# Patient Record
Sex: Female | Born: 1990 | Race: Black or African American | Hispanic: No | Marital: Single | Smoking: Former smoker
Health system: Southern US, Community
[De-identification: ages and names within clinical notes are randomized; demographics above are authoritative.]

## PROBLEM LIST (undated history)

## (undated) ENCOUNTER — Inpatient Hospital Stay (HOSPITAL_COMMUNITY): Payer: Self-pay

## (undated) DIAGNOSIS — D649 Anemia, unspecified: Secondary | ICD-10-CM

## (undated) DIAGNOSIS — J45909 Unspecified asthma, uncomplicated: Secondary | ICD-10-CM

## (undated) HISTORY — DX: Anemia, unspecified: D64.9

## (undated) HISTORY — PX: TOOTH EXTRACTION: SUR596

---
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
## 2012-05-27 ENCOUNTER — Emergency Department (HOSPITAL_COMMUNITY): Payer: Medicaid Other

## 2012-05-27 ENCOUNTER — Encounter (HOSPITAL_COMMUNITY): Payer: Self-pay | Admitting: Emergency Medicine

## 2012-05-27 ENCOUNTER — Emergency Department (HOSPITAL_COMMUNITY)
Admission: EM | Admit: 2012-05-27 | Discharge: 2012-05-27 | Disposition: A | Discharge status: Home / Self Care | Payer: Medicaid Other | Attending: Emergency Medicine | Admitting: Emergency Medicine

## 2012-05-27 DIAGNOSIS — R109 Unspecified abdominal pain: Secondary | ICD-10-CM | POA: Insufficient documentation

## 2012-05-27 DIAGNOSIS — O2390 Unspecified genitourinary tract infection in pregnancy, unspecified trimester: Secondary | ICD-10-CM | POA: Insufficient documentation

## 2012-05-27 DIAGNOSIS — E119 Type 2 diabetes mellitus without complications: Secondary | ICD-10-CM | POA: Insufficient documentation

## 2012-05-27 DIAGNOSIS — Z79899 Other long term (current) drug therapy: Secondary | ICD-10-CM | POA: Insufficient documentation

## 2012-05-27 DIAGNOSIS — N72 Inflammatory disease of cervix uteri: Secondary | ICD-10-CM

## 2012-05-27 DIAGNOSIS — F17200 Nicotine dependence, unspecified, uncomplicated: Secondary | ICD-10-CM | POA: Insufficient documentation

## 2012-05-27 DIAGNOSIS — O99891 Other specified diseases and conditions complicating pregnancy: Secondary | ICD-10-CM | POA: Insufficient documentation

## 2012-05-27 DIAGNOSIS — O24919 Unspecified diabetes mellitus in pregnancy, unspecified trimester: Secondary | ICD-10-CM | POA: Insufficient documentation

## 2012-05-27 HISTORY — DX: Unspecified asthma, uncomplicated: J45.909

## 2012-05-27 LAB — COMPREHENSIVE METABOLIC PANEL
ALT: 8 U/L (ref 0–35)
Alkaline Phosphatase: 66 U/L (ref 39–117)
BUN: 5 mg/dL — ABNORMAL LOW (ref 6–23)
Chloride: 98 mEq/L (ref 96–112)
GFR calc Af Amer: >90 mL/min (ref >90)
Glucose, Bld: 215 mg/dL — ABNORMAL HIGH (ref 70–99)
Potassium: 3.7 mEq/L (ref 3.5–5.1)
Total Bilirubin: 0.2 mg/dL — ABNORMAL LOW (ref 0.3–1.2)

## 2012-05-27 LAB — LIPASE, BLOOD: Lipase: 31 U/L (ref 11–59)

## 2012-05-27 LAB — DIFFERENTIAL
Lymphocytes Relative: 35 % (ref 12–46)
Lymphs Abs: 3 10*3/uL (ref 0.7–4.0)
Monocytes Relative: 8 % (ref 3–12)
Neutro Abs: 4.8 10*3/uL (ref 1.7–7.7)
Neutrophils Relative %: 57 % (ref 43–77)

## 2012-05-27 LAB — URINALYSIS, ROUTINE W REFLEX MICROSCOPIC
Bilirubin Urine: NEGATIVE
Leukocytes, UA: NEGATIVE
Nitrite: NEGATIVE
Specific Gravity, Urine: 1.039 — ABNORMAL HIGH (ref 1.005–1.030)
pH: 6 (ref 5.0–8.0)

## 2012-05-27 LAB — CBC
Hemoglobin: 13.5 g/dL (ref 12.0–15.0)
RBC: 4.84 MIL/uL (ref 3.87–5.11)
WBC: 8.5 10*3/uL (ref 4.0–10.5)

## 2012-05-27 LAB — URINE MICROSCOPIC-ADD ON

## 2012-05-27 LAB — WET PREP, GENITAL

## 2012-05-27 MED ORDER — ONDANSETRON HCL 4 MG/2ML IJ SOLN
4.0000 mg | Freq: Once | INTRAMUSCULAR | Status: DC
  Filled 2012-05-27 (×2): qty 2

## 2012-05-27 MED ORDER — METRONIDAZOLE 500 MG PO TABS: 500.0000 mg | ORAL_TABLET | Freq: Two times a day (BID) | ORAL | Status: AC

## 2012-05-27 MED ORDER — ONDANSETRON 4 MG PO TBDP
ORAL_TABLET | ORAL | Status: AC
  Administered 2012-05-27: 4 mg
  Filled 2012-05-27: qty 2

## 2012-05-27 MED ORDER — ONDANSETRON HCL 4 MG PO TABS: 4.0000 mg | ORAL_TABLET | Freq: Four times a day (QID) | ORAL | Status: AC

## 2012-05-27 MED ORDER — CEFTRIAXONE SODIUM 250 MG IJ SOLR
250.0000 mg | Freq: Once | INTRAMUSCULAR | Status: AC
  Administered 2012-05-27: 250 mg via INTRAMUSCULAR
  Filled 2012-05-27: qty 250

## 2012-05-27 MED ORDER — FAMOTIDINE 20 MG PO TABS: 20.0000 mg | ORAL_TABLET | Freq: Two times a day (BID) | ORAL | Status: DC

## 2012-05-27 MED ORDER — AZITHROMYCIN 250 MG PO TABS
1000.0000 mg | ORAL_TABLET | Freq: Once | ORAL | Status: AC
  Administered 2012-05-27: 1000 mg via ORAL
  Filled 2012-05-27: qty 4

## 2012-05-27 MED ORDER — LIDOCAINE HCL (PF) 1 % IJ SOLN
INTRAMUSCULAR | Status: AC
  Administered 2012-05-27: 2 mL
  Filled 2012-05-27: qty 5

## 2012-05-27 NOTE — ED Notes (Signed)
Patient tolerated pelvic exam w/o difficulty

## 2012-05-27 NOTE — ED Notes (Signed)
Patient reports she is having lower abd cramping for 1 week that wraps around to her right side.  She has had urinary frequency x 6 weeks.  Patient reports she has normal vaginal discharge,  No odor.  No bleeding.  She is to see the obgyn  Next month,  She will [facility] the [facility]

## 2012-05-27 NOTE — ED Notes (Signed)
Found out on 6/17 that she was 3 weeks preg and now she has been cramping x 1 week no bleeding  G 2 P 1 A 0 L ! lmp 04/14/12

## 2012-05-27 NOTE — Discharge Instructions (Signed)
Cervicitis   Cervicitis is a soreness and swelling (inflammation) of the cervix. Your cervix is located at the bottom of your uterus which opens up to the vagina.   CAUSES   Sexually transmitted infections (STIs).   Allergic reaction.   Medicines or birth control devices that are put in the vagina.   Injury to the cervix.   Bacterial infections.   SYMPTOMS   There may be no symptoms. If symptoms occur, they may include:   Grey, white, yellow, or bad smelling vaginal discharge.   Pain or itching of the area outside the vagina.   Painful sexual intercourse.   Lower abdominal or lower back pain, especially during intercourse.   Frequent urination.   Abnormal vaginal bleeding between periods, after sexual intercourse, or after menopause.   Pressure or a heavy feeling in the pelvis.   DIAGNOSIS   Diagnosis is made after a pelvic exam. Other tests may include:   Examination of any discharge under a microscope (wet prep).   A Pap test.   TREATMENT   Treatment will depend on the cause of cervicitis. If it is caused by an STI, both you and your partner will need to be treated. Antibiotic medicines will be given.   HOME CARE INSTRUCTIONS   Do not have sexual intercourse until your caregiver says it is okay.   Do not have sexual intercourse until your partner has been treated if your cervicitis is caused by an STI.   Take your antibiotics as directed. Finish them even if you start to feel better.   SEEK IMMEDIATE MEDICAL CARE IF:   Your symptoms come back.   You have a fever.   You experience any problems that may be related to the medicine you are taking.   MAKE SURE YOU:   Understand these instructions.   Will watch your condition.   Will get help right away if you are not doing well or get worse.   Document Released: 11/23/2005 Document Revised: 11/12/2011 Document Reviewed: 06/22/2011   [employer] Patient Information 2012 [employer].

## 2012-05-27 NOTE — ED Provider Notes (Signed)
History     CSN: [id number]  Arrival date & time 05/27/12  1119   First MD Initiated Contact with Patient 05/27/12 1156      Chief Complaint  Patient presents with  . Abdominal Pain    (Consider location/radiation/quality/duration/timing/severity/associated sxs/prior treatment) HPI Pt has had 1 week of abd cramps with associated nausea. States she is [redacted] weeks pregnant with LMP 04/14/12. No vaginal bleeding or d/c. No fever or chills. Pain not associated with eating.  Past Medical History  Diagnosis Date  . Diabetes mellitus   . Asthma     History reviewed. No pertinent past surgical history.  No family history on file.  History  Substance Use Topics  . Smoking status: Current Some Day Smoker  . Smokeless tobacco: Not on file  . Alcohol Use: No    OB History    Grav Para Term Preterm Abortions TAB SAB Ect Mult Living                  Review of Systems  Constitutional: Negative for fever and chills.  Respiratory: Negative for shortness of breath.   Cardiovascular: Negative for chest pain and palpitations.  Gastrointestinal: Positive for nausea and abdominal pain. Negative for vomiting, diarrhea and constipation.  Genitourinary: Negative for dysuria, vaginal bleeding, vaginal discharge, menstrual problem and pelvic pain.  Musculoskeletal: Positive for back pain.  Skin: Negative for rash and wound.    Allergies  Review of patient's allergies indicates no known allergies.  Home Medications   Current Outpatient Rx  Name Route Sig Dispense Refill  . FAMOTIDINE 20 MG PO TABS Oral Take 1 tablet (20 mg total) by mouth 2 (two) times daily. 30 tablet 0  . METRONIDAZOLE 500 MG PO TABS Oral Take 1 tablet (500 mg total) by mouth 2 (two) times daily. 14 tablet 0  . ONDANSETRON HCL 4 MG PO TABS Oral Take 1 tablet (4 mg total) by mouth every 6 (six) hours. 12 tablet 0    BP 134/70  Pulse 84  Temp 98.4 F (36.9 C) (Oral)  Resp 20  SpO2 98%  Physical Exam  Nursing note  and vitals reviewed. Constitutional: She is oriented to person, place, and time. She appears well-developed and well-nourished. No distress.  HENT:  Head: Normocephalic and atraumatic.  Mouth/Throat: Oropharynx is clear and moist.  Eyes: EOM are normal. Pupils are equal, round, and reactive to light.  Neck: Normal range of motion. Neck supple.  Cardiovascular: Normal rate and regular rhythm.   Pulmonary/Chest: Effort normal and breath sounds normal. No respiratory distress. She has no wheezes. She has no rales.  Abdominal: Soft. Bowel sounds are normal. She exhibits no distension and no mass. There is tenderness (mild epigastric and suprapubic TTP. No rebound or guarding). There is no rebound and no guarding.  Genitourinary: Vaginal discharge (white vaginal d/c. No CMT. Mild uterine fundal TTP. No adnexal TTP) found.  Musculoskeletal: Normal range of motion. She exhibits no edema and no tenderness.  Neurological: She is alert and oriented to person, place, and time.  Skin: Skin is warm and dry. No rash noted. No erythema.  Psychiatric: She has a normal mood and affect. Her behavior is normal.    ED Course  Procedures (including critical care time)  Labs Reviewed  URINALYSIS, ROUTINE W REFLEX MICROSCOPIC - Abnormal; Notable for the following:    APPearance HAZY (*)     Specific Gravity, Urine 1.039 (*)     Glucose, UA >1000 (*)  All other components within normal limits  COMPREHENSIVE METABOLIC PANEL - Abnormal; Notable for the following:    Sodium 131 (*)     Glucose, Bld 215 (*)     BUN 5 (*)     Total Bilirubin 0.2 (*)     All other components within normal limits  HCG, QUANTITATIVE, PREGNANCY - Abnormal; Notable for the following:    hCG, Beta Chain, Quant, S 2077 (*)     All other components within normal limits  WET PREP, GENITAL - Abnormal; Notable for the following:    Clue Cells Wet Prep HPF POC MODERATE (*)     WBC, Wet Prep HPF POC MANY (*)     All other components  within normal limits  URINE MICROSCOPIC-ADD ON - Abnormal; Notable for the following:    Squamous Epithelial / LPF FEW (*)     All other components within normal limits  CBC  DIFFERENTIAL  LIPASE, BLOOD  GC/CHLAMYDIA PROBE AMP, GENITAL   US Ob Comp Less 14 Wks  05/27/2012  *RADIOLOGY REPORT*  Clinical Data: Pregnancy with abdominal pain  OBSTETRIC <14 WK [place] AND TRANSVAGINAL OB US  Technique:  Both transabdominal and transvaginal ultrasound examinations were performed for complete evaluation of the gestation as well as the maternal uterus, adnexal regions, and pelvic cul-de-sac.  Transvaginal technique was performed to assess early pregnancy.  Comparison:  None.  Intrauterine gestational sac:  Visualized/normal in shape. Yolk sac: Seen Embryo: Not seen Cardiac Activity: Not seen Heart Rate:  bpm  MSD: 5.7   mm  5    w 2    d CRL:    mm     w     d        [place] EDC:  Maternal uterus/adnexae: The right ovary appears normal measuring 3.6 x 3.0 by 2.4 cm and the left ovary appears normal measuring 3.1 x 2.9 x 1.2 cm  A trace of simple free fluid is noted in the cul-de-sac.  No separate adnexal masses are identified  IMPRESSION: Single intrauterine gestational sac with yolk sac.  No fetal pole is yet seen but not typically expected at today's mean sac diameter of 5.7 mm.  Given normal growth rates we would typically expect to see development of a fetal pole within 1 week.  Correlation with serial beta HCG with follow-up ultrasound after this.  Can be undertaken to assess for appropriate progression of gestation.  Normal ovaries.  No adnexal masses or complex pelvic fluid noted  Original Report Authenticated By: Bertha Stakes, M.D.   US Ob Transvaginal  05/27/2012  *RADIOLOGY REPORT*  Clinical Data: Pregnancy with abdominal pain  OBSTETRIC <14 WK [place] AND TRANSVAGINAL OB US  Technique:  Both transabdominal and transvaginal ultrasound examinations were performed for complete evaluation of the gestation as well  as the maternal uterus, adnexal regions, and pelvic cul-de-sac.  Transvaginal technique was performed to assess early pregnancy.  Comparison:  None.  Intrauterine gestational sac:  Visualized/normal in shape. Yolk sac: Seen Embryo: Not seen Cardiac Activity: Not seen Heart Rate:  bpm  MSD: 5.7   mm  5    w 2    d CRL:    mm     w     d        [place] EDC:  Maternal uterus/adnexae: The right ovary appears normal measuring 3.6 x 3.0 by 2.4 cm and the left ovary appears normal measuring 3.1 x 2.9 x 1.2 cm  A  trace of simple free fluid is noted in the cul-de-sac.  No separate adnexal masses are identified  IMPRESSION: Single intrauterine gestational sac with yolk sac.  No fetal pole is yet seen but not typically expected at today's mean sac diameter of 5.7 mm.  Given normal growth rates we would typically expect to see development of a fetal pole within 1 week.  Correlation with serial beta HCG with follow-up ultrasound after this.  Can be undertaken to assess for appropriate progression of gestation.  Normal ovaries.  No adnexal masses or complex pelvic fluid noted  Original Report Authenticated By: Bertha Stakes, M.D.     1. Abdominal pain   2. Cervicitis       MDM   Pt comfortable appearing. F/u with OB-GYN or Health department. Return for concerns       Loren Racer, MD 05/27/12 1541

## 2012-05-28 LAB — GC/CHLAMYDIA PROBE AMP, GENITAL
Chlamydia, DNA Probe: NEGATIVE
GC Probe Amp, Genital: NEGATIVE

## 2012-06-29 ENCOUNTER — Other Ambulatory Visit (HOSPITAL_COMMUNITY): Payer: Self-pay | Admitting: Family

## 2012-06-29 DIAGNOSIS — Z3682 Encounter for antenatal screening for nuchal translucency: Secondary | ICD-10-CM

## 2012-06-29 LAB — OB RESULTS CONSOLE ABO/RH

## 2012-06-29 LAB — OB RESULTS CONSOLE HEPATITIS B SURFACE ANTIGEN: Hepatitis B Surface Ag: NEGATIVE

## 2012-06-29 LAB — OB RESULTS CONSOLE RPR: RPR: NONREACTIVE

## 2012-06-29 LAB — OB RESULTS CONSOLE ANTIBODY SCREEN: Antibody Screen: NEGATIVE

## 2012-06-29 LAB — OB RESULTS CONSOLE HIV ANTIBODY (ROUTINE TESTING): HIV: NONREACTIVE

## 2012-06-30 ENCOUNTER — Encounter (INDEPENDENT_AMBULATORY_CARE_PROVIDER_SITE_OTHER): Payer: Medicaid Other | Admitting: Surgery

## 2012-07-04 ENCOUNTER — Encounter: Payer: Medicaid Other | Attending: Family Medicine | Admitting: Dietician

## 2012-07-04 ENCOUNTER — Ambulatory Visit: Payer: Medicaid Other | Admitting: *Deleted

## 2012-07-04 DIAGNOSIS — O99810 Abnormal glucose complicating pregnancy: Secondary | ICD-10-CM

## 2012-07-04 DIAGNOSIS — Z713 Dietary counseling and surveillance: Secondary | ICD-10-CM | POA: Insufficient documentation

## 2012-07-04 MED ORDER — GLUCOSE BLOOD VI STRP: ORAL_STRIP | Status: DC

## 2012-07-04 MED ORDER — ACCU-CHEK FASTCLIX LANCETS MISC: 1.0000 | Freq: Four times a day (QID) | Status: DC

## 2012-07-04 NOTE — Progress Notes (Signed)
Diabetes Education:  Seen today for meter and meter instruction.  Gives history of GDM with last pregnancy 5 years ago.  Provided an [employer], [id number] Exp: 09/05/2013.  Oriented to testing fasting and 2 hr post meal blood glucose levels.  On return demonstration, Her blood glucose was 165 mg/dl.  Had no breakfast and had eaten only a pack of graham crackers and drank some water approximately 75 minutes previously.  Instructed to bring meter and glucose log to every clinic appointment.  Maggie Chareese Sergent, RN, RD, CDE

## 2012-07-04 NOTE — Progress Notes (Signed)
Nutrition Note:  1st visit- RD/CDE only Diagnosis:  Pt seen for DM education during pregnancy Wt today 164.9.  Loss of .1# from clients reported pregravid weight of 165. Pt. Given verbal and written DM education Reports nausea, heartburn, and constipation Smokes 2 cig/d - decrease from 1 ppd prior to pregnancy PNV daily Diet is inconsistent - appears to be good variety/eats when ever she is hungry/no set meal pattern Client Action Steps:  Agrees to follow DM diet including 3 meals and 3 snacks and proper CHO/protein combination. Discussed weight gain goals of 11-20# and incorporating PA [facility] appointment 07/12/12 F/U in 2-4 weeks Wilford Sports, MPH, RD, LDN

## 2012-07-11 ENCOUNTER — Encounter: Payer: Self-pay | Admitting: Family Medicine

## 2012-07-11 ENCOUNTER — Ambulatory Visit (INDEPENDENT_AMBULATORY_CARE_PROVIDER_SITE_OTHER): Payer: Medicaid Other | Admitting: Family Medicine

## 2012-07-11 VITALS — BP 138/73 | Temp 97.4°F | Ht 61.0 in | Wt 165.7 lb

## 2012-07-11 DIAGNOSIS — O24919 Unspecified diabetes mellitus in pregnancy, unspecified trimester: Secondary | ICD-10-CM

## 2012-07-11 DIAGNOSIS — O09899 Supervision of other high risk pregnancies, unspecified trimester: Secondary | ICD-10-CM | POA: Insufficient documentation

## 2012-07-11 DIAGNOSIS — J45909 Unspecified asthma, uncomplicated: Secondary | ICD-10-CM | POA: Insufficient documentation

## 2012-07-11 DIAGNOSIS — Z283 Underimmunization status: Secondary | ICD-10-CM

## 2012-07-11 DIAGNOSIS — O9989 Other specified diseases and conditions complicating pregnancy, childbirth and the puerperium: Secondary | ICD-10-CM

## 2012-07-11 DIAGNOSIS — Z2839 Other underimmunization status: Secondary | ICD-10-CM | POA: Insufficient documentation

## 2012-07-11 LAB — COMPREHENSIVE METABOLIC PANEL
ALT: <8 U/L (ref 0–35)
BUN: 6 mg/dL (ref 6–23)
CO2: 18 mEq/L — ABNORMAL LOW (ref 19–32)
Calcium: 9.5 mg/dL (ref 8.4–10.5)
Chloride: 103 mEq/L (ref 96–112)
Creat: 0.44 mg/dL — ABNORMAL LOW (ref 0.50–1.10)
Glucose, Bld: 160 mg/dL — ABNORMAL HIGH (ref 70–99)
Total Bilirubin: 0.3 mg/dL (ref 0.3–1.2)

## 2012-07-11 LAB — POCT URINALYSIS DIP (DEVICE)
Hgb urine dipstick: NEGATIVE
Ketones, ur: NEGATIVE mg/dL
Leukocytes, UA: NEGATIVE
Protein, ur: NEGATIVE mg/dL
pH: 6 (ref 5.0–8.0)

## 2012-07-11 MED ORDER — INSULIN NPH (HUMAN) (ISOPHANE) 100 UNIT/ML ~~LOC~~ SUSP: 9.0000 [IU] | Freq: Two times a day (BID) | SUBCUTANEOUS | Status: DC

## 2012-07-11 MED ORDER — INSULIN ASPART 100 UNIT/ML ~~LOC~~ SOLN: 11.0000 [IU] | Freq: Three times a day (TID) | SUBCUTANEOUS | Status: DC

## 2012-07-11 MED ORDER — ACCU-CHEK FASTCLIX LANCETS MISC: 1.0000 [IU] | Freq: Four times a day (QID) | Status: DC

## 2012-07-11 MED ORDER — GLUCOSE BLOOD VI STRP: ORAL_STRIP | Status: DC

## 2012-07-11 NOTE — Progress Notes (Signed)
   Subjective:    Cindy Bruce is a G2P1001 [redacted]w[redacted]d being seen today for her first obstetrical visit.  Her obstetrical history is significant for smoker and diabetes. Patient does intend to breast feed. Pregnancy history fully reviewed.  She is transferring from [facility].  Was insulin requiring Diabetes in previous pregnancy in [facility], unclear about whether she underwent formal Diabetes testing pp.  Reports her BS are elevated at home on her old meter.  Met with Diabetes education last week.  [id number]-[id number] 2 hour pp 136-262  Patient reports nausea.  Filed Vitals:   07/11/12 0915 07/11/12 0917  BP: 138/73   Temp: 97.4 F (36.3 C)   Height:  5\' 1"  (1.549 m)  Weight: 165 lb 11.2 oz (75.161 kg)     HISTORY: OB History    Grav Para Term Preterm Abortions TAB SAB Ect Mult Living   2 1 1  0 0 0 0 0 0 1     # Outc Date GA Lbr Len/2nd Wgt Sex Del Anes PTL Lv   1 TRM 11/08 [redacted]w[redacted]d  6lb7oz(2.92kg) F SVD EPI No Yes   2 CUR              Past Medical History  Diagnosis Date  . Diabetes mellitus   . Asthma    Past Surgical History  Procedure Date  . Tooth extraction    Family History  Problem Relation Age of Onset  . Diabetes Mother   . Diabetes Maternal Grandmother      Exam                                      System:     Skin: normal coloration and turgor, no rashes    Neurologic: oriented   Extremities: normal strength, tone, and muscle mass   HEENT sclera clear, anicteric   Mouth/Teeth mucous membranes moist, pharynx normal without lesions   Neck supple   Cardiovascular: regular rate and rhythm   Respiratory:  appears well, vitals normal, no respiratory distress, acyanotic, normal RR, ear and throat exam is normal, neck free of mass or lymphadenopathy, chest clear, no wheezing, crepitations, rhonchi, normal symmetric air entry   Abdomen: soft, non-tender; bowel sounds normal; no masses,  no organomegaly          Assessment:    Pregnancy: G2P1001 Patient Active  Problem List  Diagnosis  . Diabetes mellitus, antepartum  . Asthma        Plan:     Initial labs drawn. Prenatal vitamins. Problem list reviewed and updated. Genetic Screening discussed First Screen: ordered.  Ultrasound discussed; fetal survey: discussed.  Follow up in 1 weeks. Baseline labs on return with 24 hour urine. Insulin start today--weight based NPH 9 units q am and q hs Novolog 11 units ac meals.  Laelle Bridgett S 07/11/2012

## 2012-07-11 NOTE — Progress Notes (Signed)
Opthamology appointment scheduled with [facility] on August 22, 2012 at 1030 am.

## 2012-07-11 NOTE — Patient Instructions (Signed)
Gestational Diabetes Mellitus Gestational diabetes mellitus (GDM) is diabetes that occurs only during pregnancy. This happens when the body cannot properly handle the glucose (sugar) that increases in the blood after eating. During pregnancy, insulin resistance (reduced sensitivity to insulin) occurs because of the release of hormones from the placenta. Usually, the pancreas of pregnant women produces enough insulin to overcome the resistance that occurs. However, in gestational diabetes, the insulin is there but it does not work effectively. If the resistance is severe enough that the pancreas does not produce enough insulin, extra glucose builds up in the blood.  WHO IS AT RISK FOR DEVELOPING GESTATIONAL DIABETES?  Women with a history of diabetes in the family.   Women over age 25.   Women who are overweight.   Women in certain ethnic groups (Hispanic, African American, Native American, Asian and Pacific Islander).  WHAT CAN HAPPEN TO THE BABY? If the mother's blood glucose is too high while she is pregnant, the extra sugar will travel through the umbilical cord to the baby. Some of the problems the baby may have are:  Large Baby - If the baby receives too much sugar, the baby will gain more weight. This may cause the baby to be too large to be born normally (vaginally) and a Cesarean section (C-section) may be needed.   Low Blood Glucose (hypoglycemia) - The baby makes extra insulin, in response to the extra sugar its gets from its mother. When the baby is born and no longer needs this extra insulin, the baby's blood glucose level may drop.   Jaundice (yellow coloring of the skin and eyes) - This is fairly common in babies. It is caused from a build-up of the chemical called bilirubin. This is rarely serious, but is seen more often in babies whose mothers had gestational diabetes.  RISKS TO THE MOTHER Women who have had gestational diabetes may be at higher risk for some problems,  including:  Preeclampsia or toxemia, which includes problems with high blood pressure. Blood pressure and protein levels in the urine must be checked frequently.   Infections.   Cesarean section (C-section) for delivery.   Developing Type 2 diabetes later in life. About 30-50% will develop diabetes later, especially if obese.  DIAGNOSIS  The hormones that cause insulin resistance are highest at about 24-28 weeks of pregnancy. If symptoms are experienced, they are much like symptoms you would normally expect during pregnancy.  GDM is often diagnosed using a two part method: 1. After 24-28 weeks of pregnancy, the woman drinks a glucose solution and takes a blood test. If the glucose level is high, a second test will be given.  2. Oral Glucose Tolerance Test (OGTT) which is 3 hours long - After not eating overnight, the blood glucose is checked. The woman drinks a glucose solution, and hourly blood glucose tests are taken.  If the woman has risk factors for GDM, the caregiver may test earlier than 24 weeks of pregnancy. TREATMENT  Treatment of GDM is directed at keeping the mother's blood glucose level normal, and may include:  Meal planning.   Taking insulin or other medicine to control your blood glucose level.   Exercise.   Keeping a daily record of the foods you eat.   Blood glucose monitoring and keeping a record of your blood glucose levels.   May monitor ketone levels in the urine, although this is no longer considered necessary in most pregnancies.  HOME CARE INSTRUCTIONS  While you are pregnant:    Follow your caregiver's advice regarding your prenatal appointments, meal planning, exercise, medicines, vitamins, blood and other tests, and physical activities.   Keep a record of your meals, blood glucose tests, and the amount of insulin you are taking (if any). Show this to your caregiver at every prenatal visit.   If you have GDM, you may have problems with hypoglycemia (low  blood glucose). You may suspect this if you become suddenly dizzy, feel shaky, and/or weak. If you think this is happening and you have a glucose meter, try to test your blood glucose level. Follow your caregiver's advice for when and how to treat your low blood glucose. Generally, the 15:15 rule is followed: Treat by consuming 15 grams of carbohydrates, wait 15 minutes, and recheck blood glucose. Examples of 15 grams of carbohydrates are:   1 cup skim or low-fat milk.    cup juice.   3-4 glucose tablets.   5-6 hard candies.   1 small box raisins.    cup regular soda pop.   Practice good hygiene, to avoid infections.   Do not smoke.  SEEK MEDICAL CARE IF:   You develop abnormal vaginal discharge, with or without itching.   You become weak and tired more than expected.   You seem to sweat a lot.   You have a sudden increase in weight, 5 pounds or more in one week.   You are losing weight, 3 pounds or more in a week.   Your blood glucose level is high, and you need instructions on what to do about it.  SEEK IMMEDIATE MEDICAL CARE IF:   You develop a severe headache.   You faint or pass out.   You develop nausea and vomiting.   You become disoriented or confused.   You have a convulsion.   You develop vision problems.   You develop stomach pain.   You develop vaginal bleeding.   You develop uterine contractions.   You have leaking or a gush of fluid from the vagina.  AFTER YOU HAVE THE BABY:  Go to all of your follow-up appointments, and have blood tests as advised by your caregiver.   Maintain a healthy lifestyle, to prevent diabetes in the future. This includes:   Following a healthy meal plan.   Controlling your weight.   Getting enough exercise and proper rest.   Do not smoke.   Breastfeed your baby if you can. This will lower the chance of you and your baby developing diabetes later in life.  For more information about diabetes, go to the American  Diabetes Association at: www.americandiabetesassociation.org. For more information about gestational diabetes, go to the American Congress of Obstetricians and Gynecologists at: www.acog.org. Document Released: 03/01/2001 Document Revised: 11/12/2011 Document Reviewed: 09/23/2009 [employer] Patient Information 2012 [employer]. Breastfeeding BENEFITS OF BREASTFEEDING For the baby  The first milk (colostrum) helps the baby's digestive system function better.   There are antibodies from the mother in the milk that help the baby fight off infections.   The baby has a lower incidence of asthma, allergies, and SIDS (sudden infant death syndrome).   The nutrients in breast milk are better than formulas for the baby and helps the baby's brain grow better.   Babies who breastfeed have less gas, colic, and constipation.  For the mother  Breastfeeding helps develop a very special bond between mother and baby.   It is more convenient, always available at the correct temperature and cheaper than formula feeding.   It   burns calories in the mother and helps with losing weight that was gained during pregnancy.   It makes the uterus contract back down to normal size faster and slows bleeding following delivery.   Breastfeeding mothers have a lower risk of developing breast cancer.  NURSE FREQUENTLY  A healthy, full-term baby may breastfeed as often as every hour or space his or her feedings to every 3 hours.   How often to nurse will vary from baby to baby. Watch your baby for signs of hunger, not the clock.   Nurse as often as the baby requests, or when you feel the need to reduce the fullness of your breasts.   Awaken the baby if it has been 3 to 4 hours since the last feeding.   Frequent feeding will help the mother make more milk and will prevent problems like sore nipples and engorgement of the breasts.  BABY'S POSITION AT THE BREAST  Whether lying down or sitting, be sure that the  baby's tummy is facing your tummy.   Support the breast with 4 fingers underneath the breast and the thumb above. Make sure your fingers are well away from the nipple and baby's mouth.   Stroke the baby's lips and cheek closest to the breast gently with your finger or nipple.   When the baby's mouth is open wide enough, place all of your nipple and as much of the dark area around the nipple as possible into your baby's mouth.   Pull the baby in close so the tip of the nose and the baby's cheeks touch the breast during the feeding.  FEEDINGS  The length of each feeding varies from baby to baby and from feeding to feeding.   The baby must suck about 2 to 3 minutes for your milk to get to him or her. This is called a "let down." For this reason, allow the baby to feed on each breast as long as he or she wants. Your baby will end the feeding when he or she has received the right balance of nutrients.   To break the suction, put your finger into the corner of the baby's mouth and slide it between his or her gums before removing your breast from his or her mouth. This will help prevent sore nipples.  REDUCING BREAST ENGORGEMENT  In the first week after your baby is born, you may experience signs of breast engorgement. When breasts are engorged, they feel heavy, warm, full, and may be tender to the touch. You can reduce engorgement if you:   Nurse frequently, every 2 to 3 hours. Mothers who breastfeed early and often have fewer problems with engorgement.   Place light ice packs on your breasts between feedings. This reduces swelling. Wrap the ice packs in a lightweight towel to protect your skin.   Apply moist hot packs to your breast for 5 to 10 minutes before each feeding. This increases circulation and helps the milk flow.   Gently massage your breast before and during the feeding.   Make sure that the baby empties at least one breast at every feeding before switching sides.   Use a breast  pump to empty the breasts if your baby is sleepy or not nursing well. You may also want to pump if you are returning to work or or you feel you are getting engorged.   Avoid bottle feeds, pacifiers or supplemental feedings of water or juice in place of breastfeeding.   Be sure the   baby is latched on and positioned properly while breastfeeding.   Prevent fatigue, stress, and anemia.   Wear a supportive bra, avoiding underwire styles.   Eat a balanced diet with enough fluids.  If you follow these suggestions, your engorgement should improve in 24 to 48 hours. If you are still experiencing difficulty, call your lactation consultant or caregiver. IS MY BABY GETTING ENOUGH MILK? Sometimes, mothers worry about whether their babies are getting enough milk. You can be assured that your baby is getting enough milk if:  The baby is actively sucking and you hear swallowing.   The baby nurses at least 8 to 12 times in a 24 hour time period. Nurse your baby until he or she unlatches or falls asleep at the first breast (at least 10 to 20 minutes), then offer the second side.   The baby is wetting 5 to 6 disposable diapers (6 to 8 cloth diapers) in a 24 hour period by 5 to 6 days of age.   The baby is having at least 2 to 3 stools every 24 hours for the first few months. Breast milk is all the food your baby needs. It is not necessary for your baby to have water or formula. In fact, to help your breasts make more milk, it is best not to give your baby supplemental feedings during the early weeks.   The stool should be soft and yellow.   The baby should gain 4 to 7 ounces per week after he is 4 days old.  TAKE CARE OF YOURSELF Take care of your breasts by:  Bathing or showering daily.   Avoiding the use of soaps on your nipples.   Start feedings on your left breast at one feeding and on your right breast at the next feeding.   You will notice an increase in your milk supply 2 to 5 days after  delivery. You may feel some discomfort from engorgement, which makes your breasts very firm and often tender. Engorgement "peaks" out within 24 to 48 hours. In the meantime, apply warm moist towels to your breasts for 5 to 10 minutes before feeding. Gentle massage and expression of some milk before feeding will soften your breasts, making it easier for your baby to latch on. Wear a well fitting nursing bra and air dry your nipples for 10 to 15 minutes after each feeding.   Only use cotton bra pads.   Only use pure lanolin on your nipples after nursing. You do not need to wash it off before nursing.  Take care of yourself by:   Eating well-balanced meals and nutritious snacks.   Drinking milk, fruit juice, and water to satisfy your thirst (about 8 glasses a day).   Getting plenty of rest.   Increasing calcium in your diet (1200 mg a day).   Avoiding foods that you notice affect the baby in a bad way.  SEEK MEDICAL CARE IF:   You have any questions or difficulty with breastfeeding.   You need help.   You have a hard, red, sore area on your breast, accompanied by a fever of 100.5 F (38.1 C) or more.   Your baby is too sleepy to eat well or is having trouble sleeping.   Your baby is wetting less than 6 diapers per day, by 5 days of age.   Your baby's skin or white part of his or her eyes is more yellow than it was in the hospital.   You feel depressed.    Document Released: 11/23/2005 Document Revised: 11/12/2011 Document Reviewed: 07/08/2009 [employer] Patient Information 2012 [employer]. 

## 2012-07-11 NOTE — Progress Notes (Signed)
[id number]=[phone] 

## 2012-07-12 ENCOUNTER — Other Ambulatory Visit: Payer: Self-pay

## 2012-07-12 ENCOUNTER — Encounter (HOSPITAL_COMMUNITY): Payer: Self-pay

## 2012-07-12 ENCOUNTER — Ambulatory Visit (HOSPITAL_COMMUNITY)
Admission: RE | Admit: 2012-07-12 | Discharge: 2012-07-12 | Disposition: A | Discharge status: Home / Self Care | Payer: Medicaid Other | Source: Ambulatory Visit | Attending: Family Medicine | Admitting: Family Medicine

## 2012-07-12 ENCOUNTER — Encounter: Payer: Self-pay | Admitting: Family Medicine

## 2012-07-12 ENCOUNTER — Ambulatory Visit (HOSPITAL_COMMUNITY)
Admission: RE | Admit: 2012-07-12 | Discharge: 2012-07-12 | Disposition: A | Discharge status: Home / Self Care | Payer: Medicaid Other | Source: Ambulatory Visit | Attending: Family | Admitting: Family

## 2012-07-12 DIAGNOSIS — O3510X0 Maternal care for (suspected) chromosomal abnormality in fetus, unspecified, not applicable or unspecified: Secondary | ICD-10-CM | POA: Insufficient documentation

## 2012-07-12 DIAGNOSIS — O351XX0 Maternal care for (suspected) chromosomal abnormality in fetus, not applicable or unspecified: Secondary | ICD-10-CM | POA: Insufficient documentation

## 2012-07-12 DIAGNOSIS — Z283 Underimmunization status: Secondary | ICD-10-CM

## 2012-07-12 DIAGNOSIS — O24919 Unspecified diabetes mellitus in pregnancy, unspecified trimester: Secondary | ICD-10-CM | POA: Insufficient documentation

## 2012-07-12 DIAGNOSIS — Z3682 Encounter for antenatal screening for nuchal translucency: Secondary | ICD-10-CM

## 2012-07-12 DIAGNOSIS — Z3689 Encounter for other specified antenatal screening: Secondary | ICD-10-CM | POA: Insufficient documentation

## 2012-07-12 MED ORDER — GLUCOSE BLOOD VI STRP: ORAL_STRIP | Status: DC

## 2012-07-12 MED ORDER — INSULIN ASPART 100 UNIT/ML ~~LOC~~ SOLN: 11.0000 [IU] | Freq: Three times a day (TID) | SUBCUTANEOUS | Status: DC

## 2012-07-12 MED ORDER — INSULIN NPH (HUMAN) (ISOPHANE) 100 UNIT/ML ~~LOC~~ SUSP: 9.0000 [IU] | Freq: Two times a day (BID) | SUBCUTANEOUS | Status: DC

## 2012-07-12 MED ORDER — FAMOTIDINE 20 MG PO TABS: 20.0000 mg | ORAL_TABLET | Freq: Two times a day (BID) | ORAL | Status: DC

## 2012-07-12 MED ORDER — ACCU-CHEK FASTCLIX LANCETS MISC: 1.0000 | Freq: Four times a day (QID) | Status: DC

## 2012-07-12 NOTE — Progress Notes (Signed)
Cindy Bruce was seen for ultrasound appointment today.  Please see [facility] report for details.  

## 2012-07-12 NOTE — Telephone Encounter (Signed)
MFM called and stated that Rx's was sent to wrong pharmacy could we change pharmacy.  Verified pharmacy with pt.

## 2012-07-13 DIAGNOSIS — Z283 Underimmunization status: Secondary | ICD-10-CM

## 2012-07-13 DIAGNOSIS — O24919 Unspecified diabetes mellitus in pregnancy, unspecified trimester: Secondary | ICD-10-CM

## 2012-07-14 LAB — CREATININE CLEARANCE, URINE, 24 HOUR
Creatinine Clearance: 86 mL/min (ref 75–115)
Creatinine, 24H Ur: 548 mg/d — ABNORMAL LOW (ref 700–1800)
Creatinine, Urine: 84.3 mg/dL
Creatinine: 0.44 mg/dL — ABNORMAL LOW (ref 0.50–1.10)

## 2012-07-14 LAB — PROTEIN, URINE, 24 HOUR: Protein, Urine: 5 mg/dL

## 2012-07-15 ENCOUNTER — Other Ambulatory Visit: Payer: Self-pay | Admitting: Family Medicine

## 2012-07-15 DIAGNOSIS — Z3689 Encounter for other specified antenatal screening: Secondary | ICD-10-CM

## 2012-07-15 DIAGNOSIS — O24919 Unspecified diabetes mellitus in pregnancy, unspecified trimester: Secondary | ICD-10-CM

## 2012-07-18 ENCOUNTER — Encounter: Payer: Medicaid Other | Admitting: Obstetrics & Gynecology

## 2012-07-19 ENCOUNTER — Encounter: Payer: Self-pay | Admitting: Family Medicine

## 2012-07-25 ENCOUNTER — Encounter: Payer: Medicaid Other | Admitting: Family Medicine

## 2012-08-01 ENCOUNTER — Ambulatory Visit: Payer: Medicaid Other | Admitting: Physician Assistant

## 2012-08-01 LAB — POCT URINALYSIS DIP (DEVICE)
Leukocytes, UA: NEGATIVE
Nitrite: NEGATIVE
Protein, ur: NEGATIVE mg/dL
Urobilinogen, UA: 1 mg/dL (ref 0.0–1.0)
pH: 6.5 (ref 5.0–8.0)

## 2012-08-01 LAB — GLUCOSE, CAPILLARY: Glucose-Capillary: 116 mg/dL — ABNORMAL HIGH (ref 70–99)

## 2012-08-01 NOTE — Patient Instructions (Signed)
Insulin Treatment in Diabetes Diabetes is a lasting (chronic) disease. It occurs when the body does not properly use the sugar (glucose) that is released from food. Glucose levels are controlled by a hormone called insulin, which is made by your pancreas. Depending on the type of diabetes you have, either:  The pancreas does not make any insulin (type 1 diabetes).   The pancreas makes too little insulin, and the body cannot respond normally to the insulin that is made (type 2 diabetes).  Without insulin, death can occur. Diabetes requires lifelong monitoring and treatment. This document will discuss the role of insulin in your treatment and provide information about insulin.  LIFESTYLE CHOICES Lifestyle choices can affect both the control of your diabetes and your risk of complications from diabetes. Lifestyle choices are critically important in the overall management of diabetes. They are important even if you do not need to take insulin.  Eat a healthy diet. Ask for help if you need it.   Exercise regularly. Ask for help if you need it.   Diet and exercise can help:   Reduce the amount of insulin you need.   Your body to use your insulin more effectively.   Reduce your risk of high blood pressure (or reduce your blood pressure, if it is high).   Reduce your cholesterol level.   Reduce your weight.  Healthy lifestyle choices play an important role in controlling cardiovascular disease (heart attack, stroke, vascular disease, and others), which is a primary complication of diabetes. For optimal control of diabetes, you must reduce your cardiovascular risks and manage your blood sugar. MEDICATIONS BESIDES INSULIN  Your caregiver may recommend medicines for you in addition to insulin. This will depend on 3 things:   Your diabetes type.   How well insulin alone meets your treatment goals.   Other health factors.  There are different types of medicines that help treat diabetes. The goal  is to control your blood sugar the best way possible, which will reduce your risk of complications. Adding other medicines may also reduce the amount of insulin you need.  INSULIN  People with type 1 diabetes must take insulin to stay alive. Their body does not produce it. People with type 2 diabetes might require insulin in addition to, or instead of, other medicines. In either case, proper use of insulin is critical to control your diabetes.  There are a number of different types of insulin. Usually, you will give yourself injections, though others can be trained to give them to you. Some people have an insulin pump that delivers insulin continuously through a soft, flexible tube (canula) that is placedunder the skin of the abdomen.Other sites including the hips, thighs, or upper arms may also be used.  Using insulin requires that you check your blood sugar several times a day. The exact number of times and time of day to check your blood sugar will vary depending on your type of diabetes, your type of insulin, and treatment goals. Your caregiver will direct you.  Generally, different insulins have different properties. The following is a general guide. Specifics will vary by product, and new products are introduced periodically.   Short-acting insulin starts working quickly (in as little as 5 minutes) and wears off in 3 to 6 hours (sometimes longer). This type of insulin works well when taken before a meal to bring your blood sugar quickly back to normal. There are several different types of short-acting insulin. Some work quickly and others last  longer in your system.   Intermediate-acting insulin starts working in 2 hours and wears off after about 10 to18 hours. This insulin will lower your blood sugar for a longer period of time, but will not be as effective in lowering your blood sugar right after a meal.   Long-acting insulin mimics the small amount of insulin that your pancreas usually  produces throughout the day. You need to have some insulin present at all times, as it is crucial to the metabolism of brain cells and other cells. Long-acting insulin is meant to be used either once or twice a day. It is usually used in combination with other types of insulin, or in combination with other diabetes medicines.  Discuss the type of insulin you are taking with your caregiver or pharmacist. You will then be aware of when the insulin can be expected to peak and when it will wear off.  Your caregiver will usually have a strategy in mind when treating you with insulin. This will vary with your type of diabetes, your diabetes treatment goals, and your health history. It is important that you understand something about this strategy so you may be a partner in treating your diabetes. Here are some terms you might hear:  Basal insulin. You need to have a small amount of insulin present in your blood at all times. Sometimes oral medicines will be enough. For other people, and especially for people with type 1 diabetes, insulin is needed. Usually, intermediate-acting or long-acting insulin is used once or twice a day to accomplish this.   Prandial (meal-related) insulin. Your blood sugar will rise rapidly after a meal. Short-acting insulin can be used right before the meal to bring your blood sugar back to normal quickly. You might be instructed to adjust the amount of insulin depending on how much carbohydrate (starch) is in your meal.   Corrective insulin. You might be instructed to check your blood sugar at certain times of the day. You then might use a small amount of short-acting insulin to bring the blood sugar down to normal if it is elevated.   Tight control (also called intensive therapy). Tight control is keeping your blood sugar as close to your target as possible and keeping it from going too high after meals. People with tight control of their diabetes may have fewer long-term  complications from their diabetes.   Glycohemoglobin (also called glyco, glycosylated hemoglobin, Hemoglobin A1c, or A1c) level. This measures how well your blood sugar has been controlled during the past 1 to 3 months. It helps your caregiver see how effective your treatment is and decide if any changes are needed. Your caregiver will discuss your target glycohemoglobin with you.  Insulin treatment requires your careful attention. Treatment plans will be different for different people. Some people do well with a simple program. Others require more complicated programs with multiple insulin injections daily. You will work with your caregiver to develop the best program for you. Regardless of your insulin treatment plan, you must also do your best on weight control, diet and food choices, exercise, blood pressure control, and cholesterol control.  BENEFITS OF DIABETES TREATMENT  Research shows that optimal treatment of your diabetes will reduce the risk of kidney, eye, and nerve complications. If you have type 1 diabetes, your risk of heart and vascular disease also decreases with good diabetes control. The better you control your blood sugars (and the lower your glycohemoglobin), the lower your risk of complications. RISKS OF INSULIN  Although insulin treatment is important, it does have some risks. Insulin treatment may be complex, but it is critical for maintaining your good health. Frequent follow-up visits with your caregiver, at least early on, are usually needed.  Insulin can cause your blood sugar to go too low (hypoglycemia). This can be a dangerous complication that must be quickly recognized and treated.   Weight gain can occur.   Improper injection technique can cause hypoglycemia, skin injury or irritation, or other problems. You must learn to inject insulin properly.   Other medicines used for diabetes can have other complications. Discuss your medicines and their complications with your  caregiver.  GOALS OF DIABETES CARE  The goal of treating your diabetes is to allow you to live as long as you can with as few complications as possible. Several factors help you work towards this goal:  You should achieve a blood sugar as close to normal as possible without causing hypoglycemia. Generally, this means a glycohemoglobin of between 6% and 7%.   You should achieve and maintain an ideal body weight.   You should exercise regularly.   Your blood pressure should be under 130/80.   Your cholesterol should be controlled, with your LDL under 100. Your target may be different depending on your health factors.   You should not smoke.   You should be up to date on immunizations, including influenza and pneumonia.   You should be monitored for eye, kidney, heart, and vascular health regularly. Preventative medicines are sometimes used for these conditions.  Your specific goals may vary, depending on your health factors. You should discuss issues with your caregiver.  HOME CARE INSTRUCTIONS   Do not smoke.   Eat a healthy diet as instructed. Ask for help if you need it.   Exercise regularly. Ask for help if you need it.   Stay up to date on your immunizations.   See your caregiver on a regular basis.   Follow your diabetes care plan carefully. Take medicines and insulin as directed. Check your blood sugar as directed, and keep track of it in a log. Understand your glycohemoglobin and other diabetes goals. Understand how to detect and treat hypoglycemia.   Follow your care plan for blood pressure and elevated cholesterol if you have these problems.   Do your blood tests as directed. This is important and helps monitor your diabetes.   Check your feet every night for sores or wounds.   See your eye doctor once a year.   If you have an illness that causes loss of appetite, vomiting, or diarrhea, you should speak with your caregiver about temporary changes in your insulin  doses and other medicines.  SEEK MEDICAL CARE IF:  You are having problems keeping your blood sugar at target range.   You are having episodes of hypoglycemia.   You are having side effects from your medicines.   You have symptoms of an illness that are not improving after 3 to 4 days.   You have a sore or wound that is not healing.   You notice a change in vision or a new problem with your vision.   You develop a fever of more than 100.5 F (38.1 C).  SEEK IMMEDIATE MEDICAL CARE IF:   Your blood sugar goes below 70, especially if you have confusion, lightheadedness, or other symptoms.   Your blood sugar is very high (as advised by your caregiver) twice in a row.   An unexplained oral temperature  above 102 F (38.9 C) develops.   You pass out.   You have chest pain or trouble breathing.   You have a sudden, severe headache.   You have sudden weakness in one arm or leg.   You have sudden difficulty speaking or swallowing.   You develop vomiting or diarrhea that is getting worse or not improving after 1 day.  Document Released: 02/19/2009 Document Revised: 11/12/2011 Document Reviewed: 03/19/2011 [facility] Patient Information 2012 [place], [place].

## 2012-08-01 NOTE — Progress Notes (Signed)
Pulse 98 Patient states she ran out of her strips, lancets, and insulin and "cannot get them until tomorrow because that's when medicaid will pay for it"- ran out several weeks ago

## 2012-08-01 NOTE — Progress Notes (Signed)
Not checking BS or taking insulin, will start tomorrow after she picks up from pharmacy. No complaints. Will check random BS today. FU next Monday. Encouraged to take insulin and check BS as directed. Risk/Benefits reviewed. Pt verbalizes understanding. Ophtho appt 9/16, MFM appt 9/17. BS: 116. Ate sausage at 6:30 this morning.

## 2012-08-11 ENCOUNTER — Ambulatory Visit (INDEPENDENT_AMBULATORY_CARE_PROVIDER_SITE_OTHER): Payer: Medicaid Other | Admitting: Family Medicine

## 2012-08-11 ENCOUNTER — Encounter: Payer: Self-pay | Admitting: Family Medicine

## 2012-08-11 VITALS — BP 119/74 | Temp 97.8°F | Wt 165.2 lb

## 2012-08-11 DIAGNOSIS — O24919 Unspecified diabetes mellitus in pregnancy, unspecified trimester: Secondary | ICD-10-CM

## 2012-08-11 DIAGNOSIS — O0990 Supervision of high risk pregnancy, unspecified, unspecified trimester: Secondary | ICD-10-CM

## 2012-08-11 LAB — POCT URINALYSIS DIP (DEVICE)
Glucose, UA: 500 mg/dL — AB
Leukocytes, UA: NEGATIVE
Nitrite: NEGATIVE
Protein, ur: NEGATIVE mg/dL
Urobilinogen, UA: 1 mg/dL (ref 0.0–1.0)

## 2012-08-11 MED ORDER — INSULIN ASPART 100 UNIT/ML ~~LOC~~ SOLN: 11.0000 [IU] | Freq: Three times a day (TID) | SUBCUTANEOUS | Status: DC

## 2012-08-11 MED ORDER — INSULIN NPH (HUMAN) (ISOPHANE) 100 UNIT/ML ~~LOC~~ SUSP: 11.0000 [IU] | Freq: Two times a day (BID) | SUBCUTANEOUS | Status: DC
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
## 2012-08-11 NOTE — Progress Notes (Signed)
FBS 114-146 2 hr pp 133-209-Lost her novolog--will refill. Anatomy is scheduled.  To see opthalmology.

## 2012-08-11 NOTE — Patient Instructions (Signed)
Gestational Diabetes Mellitus Gestational diabetes mellitus (GDM) is diabetes that occurs only during pregnancy. This happens when the body cannot properly handle the glucose (sugar) that increases in the blood after eating. During pregnancy, insulin resistance (reduced sensitivity to insulin) occurs because of the release of hormones from the placenta. Usually, the pancreas of pregnant women produces enough insulin to overcome the resistance that occurs. However, in gestational diabetes, the insulin is there but it does not work effectively. If the resistance is severe enough that the pancreas does not produce enough insulin, extra glucose builds up in the blood.  WHO IS AT RISK FOR DEVELOPING GESTATIONAL DIABETES?  Women with a history of diabetes in the family.   Women over age 25.   Women who are overweight.   Women in certain ethnic groups (Hispanic, African American, Native American, Asian and Pacific Islander).  WHAT CAN HAPPEN TO THE BABY? If the mother's blood glucose is too high while she is pregnant, the extra sugar will travel through the umbilical cord to the baby. Some of the problems the baby may have are:  Large Baby - If the baby receives too much sugar, the baby will gain more weight. This may cause the baby to be too large to be born normally (vaginally) and a Cesarean section (C-section) may be needed.   Low Blood Glucose (hypoglycemia) - The baby makes extra insulin, in response to the extra sugar its gets from its mother. When the baby is born and no longer needs this extra insulin, the baby's blood glucose level may drop.   Jaundice (yellow coloring of the skin and eyes) - This is fairly common in babies. It is caused from a build-up of the chemical called bilirubin. This is rarely serious, but is seen more often in babies whose mothers had gestational diabetes.  RISKS TO THE MOTHER Women who have had gestational diabetes may be at higher risk for some problems,  including:  Preeclampsia or toxemia, which includes problems with high blood pressure. Blood pressure and protein levels in the urine must be checked frequently.   Infections.   Cesarean section (C-section) for delivery.   Developing Type 2 diabetes later in life. About 30-50% will develop diabetes later, especially if obese.  DIAGNOSIS  The hormones that cause insulin resistance are highest at about 24-28 weeks of pregnancy. If symptoms are experienced, they are much like symptoms you would normally expect during pregnancy.  GDM is often diagnosed using a two part method: 1. After 24-28 weeks of pregnancy, the woman drinks a glucose solution and takes a blood test. If the glucose level is high, a second test will be given.  2. Oral Glucose Tolerance Test (OGTT) which is 3 hours long - After not eating overnight, the blood glucose is checked. The woman drinks a glucose solution, and hourly blood glucose tests are taken.  If the woman has risk factors for GDM, the caregiver may test earlier than 24 weeks of pregnancy. TREATMENT  Treatment of GDM is directed at keeping the mother's blood glucose level normal, and may include:  Meal planning.   Taking insulin or other medicine to control your blood glucose level.   Exercise.   Keeping a daily record of the foods you eat.   Blood glucose monitoring and keeping a record of your blood glucose levels.   May monitor ketone levels in the urine, although this is no longer considered necessary in most pregnancies.  HOME CARE INSTRUCTIONS  While you are pregnant:    Follow your caregiver's advice regarding your prenatal appointments, meal planning, exercise, medicines, vitamins, blood and other tests, and physical activities.   Keep a record of your meals, blood glucose tests, and the amount of insulin you are taking (if any). Show this to your caregiver at every prenatal visit.   If you have GDM, you may have problems with hypoglycemia (low  blood glucose). You may suspect this if you become suddenly dizzy, feel shaky, and/or weak. If you think this is happening and you have a glucose meter, try to test your blood glucose level. Follow your caregiver's advice for when and how to treat your low blood glucose. Generally, the 15:15 rule is followed: Treat by consuming 15 grams of carbohydrates, wait 15 minutes, and recheck blood glucose. Examples of 15 grams of carbohydrates are:   1 cup skim or low-fat milk.    cup juice.   3-4 glucose tablets.   5-6 hard candies.   1 small box raisins.    cup regular soda pop.   Practice good hygiene, to avoid infections.   Do not smoke.  SEEK MEDICAL CARE IF:   You develop abnormal vaginal discharge, with or without itching.   You become weak and tired more than expected.   You seem to sweat a lot.   You have a sudden increase in weight, 5 pounds or more in one week.   You are losing weight, 3 pounds or more in a week.   Your blood glucose level is high, and you need instructions on what to do about it.  SEEK IMMEDIATE MEDICAL CARE IF:   You develop a severe headache.   You faint or pass out.   You develop nausea and vomiting.   You become disoriented or confused.   You have a convulsion.   You develop vision problems.   You develop stomach pain.   You develop vaginal bleeding.   You develop uterine contractions.   You have leaking or a gush of fluid from the vagina.  AFTER YOU HAVE THE BABY:  Go to all of your follow-up appointments, and have blood tests as advised by your caregiver.   Maintain a healthy lifestyle, to prevent diabetes in the future. This includes:   Following a healthy meal plan.   Controlling your weight.   Getting enough exercise and proper rest.   Do not smoke.   Breastfeed your baby if you can. This will lower the chance of you and your baby developing diabetes later in life.  For more information about diabetes, go to the American  Diabetes Association at: www.americandiabetesassociation.org. For more information about gestational diabetes, go to the American Congress of Obstetricians and Gynecologists at: www.acog.org. Document Released: 03/01/2001 Document Revised: 11/12/2011 Document Reviewed: 09/23/2009 [employer] Patient Information 2012 [employer]. 

## 2012-08-11 NOTE — Progress Notes (Signed)
Pt. Can tell me exact risk of poorly controlled BS.

## 2012-08-22 ENCOUNTER — Encounter: Payer: Medicaid Other | Admitting: Advanced Practice Midwife

## 2012-08-23 ENCOUNTER — Ambulatory Visit (HOSPITAL_COMMUNITY)
Admission: RE | Admit: 2012-08-23 | Discharge: 2012-08-23 | Disposition: A | Discharge status: Home / Self Care | Payer: Medicaid Other | Source: Ambulatory Visit | Attending: Advanced Practice Midwife | Admitting: Advanced Practice Midwife

## 2012-08-23 VITALS — BP 125/72 | HR 92 | Wt 167.0 lb

## 2012-08-23 DIAGNOSIS — O358XX0 Maternal care for other (suspected) fetal abnormality and damage, not applicable or unspecified: Secondary | ICD-10-CM | POA: Insufficient documentation

## 2012-08-23 DIAGNOSIS — Z363 Encounter for antenatal screening for malformations: Secondary | ICD-10-CM | POA: Insufficient documentation

## 2012-08-23 DIAGNOSIS — Z1389 Encounter for screening for other disorder: Secondary | ICD-10-CM | POA: Insufficient documentation

## 2012-08-23 DIAGNOSIS — Z283 Underimmunization status: Secondary | ICD-10-CM

## 2012-08-23 DIAGNOSIS — Z3689 Encounter for other specified antenatal screening: Secondary | ICD-10-CM

## 2012-08-23 DIAGNOSIS — O0990 Supervision of high risk pregnancy, unspecified, unspecified trimester: Secondary | ICD-10-CM

## 2012-08-23 DIAGNOSIS — O24919 Unspecified diabetes mellitus in pregnancy, unspecified trimester: Secondary | ICD-10-CM | POA: Insufficient documentation

## 2012-08-25 ENCOUNTER — Encounter: Payer: Self-pay | Admitting: Family Medicine

## 2012-08-29 ENCOUNTER — Ambulatory Visit (INDEPENDENT_AMBULATORY_CARE_PROVIDER_SITE_OTHER): Payer: Medicaid Other | Admitting: Family Medicine

## 2012-08-29 VITALS — BP 108/73 | Temp 97.6°F | Wt 166.0 lb

## 2012-08-29 DIAGNOSIS — Z23 Encounter for immunization: Secondary | ICD-10-CM

## 2012-08-29 DIAGNOSIS — O99810 Abnormal glucose complicating pregnancy: Secondary | ICD-10-CM

## 2012-08-29 DIAGNOSIS — O24919 Unspecified diabetes mellitus in pregnancy, unspecified trimester: Secondary | ICD-10-CM

## 2012-08-29 LAB — POCT URINALYSIS DIP (DEVICE)
Leukocytes, UA: NEGATIVE
Nitrite: NEGATIVE
Protein, ur: NEGATIVE mg/dL
pH: 5.5 (ref 5.0–8.0)

## 2012-08-29 MED ORDER — INSULIN NPH (HUMAN) (ISOPHANE) 100 UNIT/ML ~~LOC~~ SUSP: SUBCUTANEOUS | Status: DC

## 2012-08-29 MED ORDER — INSULIN ASPART 100 UNIT/ML ~~LOC~~ SOLN: 12.0000 [IU] | Freq: Three times a day (TID) | SUBCUTANEOUS | Status: DC

## 2012-08-29 MED ORDER — INFLUENZA VIRUS VACC SPLIT PF IM SUSP
0.5000 mL | Freq: Once | INTRAMUSCULAR | Status: AC
  Administered 2012-08-29: 0.5 mL via INTRAMUSCULAR

## 2012-08-29 NOTE — Progress Notes (Signed)
Not checking blood sugar every day - last 4 days:  Fasting 111-152, PP breakfast 114-196, post lunch and dinner 125-137. Will work on breakfast diet and increase NPH at night to 12. Increase mealtime Novolog to 12. Sono for fetal survey normal. Did not visualize placental location. Has repeat sono scheduled on 10/15.

## 2012-08-29 NOTE — Progress Notes (Signed)
Pulse 93. Flu vaccine declined.

## 2012-08-29 NOTE — Addendum Note (Signed)
Addended by: Kathee Delton on: 08/29/2012 01:09 PM   Modules accepted: Orders, Level of Service

## 2012-08-29 NOTE — Patient Instructions (Addendum)
Increase NPH at night to 12 units. Increase Novolog to 12 units with each meal.  Gestational Diabetes Mellitus Gestational diabetes mellitus (GDM) is diabetes that occurs only during pregnancy. This happens when the body cannot properly handle the glucose (sugar) that increases in the blood after eating. During pregnancy, insulin resistance (reduced sensitivity to insulin) occurs because of the release of hormones from the placenta. Usually, the pancreas of pregnant women produces enough insulin to overcome the resistance that occurs. However, in gestational diabetes, the insulin is there but it does not work effectively. If the resistance is severe enough that the pancreas does not produce enough insulin, extra glucose builds up in the blood.  WHO IS AT RISK FOR DEVELOPING GESTATIONAL DIABETES?  Women with a history of diabetes in the family.   Women over age 72.   Women who are overweight.   Women in certain ethnic groups (Hispanic, African American, Native American, [place] and [place]er).  WHAT CAN HAPPEN TO THE BABY? If the mother's blood glucose is too high while she is pregnant, the extra sugar will travel through the umbilical cord to the baby. Some of the problems the baby may have are:  Large Baby - If the baby receives too much sugar, the baby will gain more weight. This may cause the baby to be too large to be born normally (vaginally) and a Cesarean section (C-section) may be needed.   Low Blood Glucose (hypoglycemia) - The baby makes extra insulin, in response to the extra sugar its gets from its mother. When the baby is born and no longer needs this extra insulin, the baby's blood glucose level may drop.   Jaundice (yellow coloring of the skin and eyes) - This is fairly common in babies. It is caused from a build-up of the chemical called bilirubin. This is rarely serious, but is seen more often in babies whose mothers had gestational diabetes.  RISKS TO THE MOTHER Women who  have had gestational diabetes may be at higher risk for some problems, including:  Preeclampsia or toxemia, which includes problems with high blood pressure. Blood pressure and protein levels in the urine must be checked frequently.   Infections.   Cesarean section (C-section) for delivery.   Developing Type 2 diabetes later in life. About 30-50% will develop diabetes later, especially if obese.  DIAGNOSIS  The hormones that cause insulin resistance are highest at about 24-28 weeks of pregnancy. If symptoms are experienced, they are much like symptoms you would normally expect during pregnancy.  GDM is often diagnosed using a two part method: 1. After 24-28 weeks of pregnancy, the woman drinks a glucose solution and takes a blood test. If the glucose level is high, a second test will be given.  2. Oral Glucose Tolerance Test (OGTT) which is 3 hours long - After not eating overnight, the blood glucose is checked. The woman drinks a glucose solution, and hourly blood glucose tests are taken.  If the woman has risk factors for GDM, the caregiver may test earlier than 24 weeks of pregnancy. TREATMENT  Treatment of GDM is directed at keeping the mother's blood glucose level normal, and may include:  Meal planning.   Taking insulin or other medicine to control your blood glucose level.   Exercise.   Keeping a daily record of the foods you eat.   Blood glucose monitoring and keeping a record of your blood glucose levels.   May monitor ketone levels in the urine, although this is no  longer considered necessary in most pregnancies.  HOME CARE INSTRUCTIONS  While you are pregnant:  Follow your caregiver's advice regarding your prenatal appointments, meal planning, exercise, medicines, vitamins, blood and other tests, and physical activities.   Keep a record of your meals, blood glucose tests, and the amount of insulin you are taking (if any). Show this to your caregiver at every prenatal  visit.   If you have GDM, you may have problems with hypoglycemia (low blood glucose). You may suspect this if you become suddenly dizzy, feel shaky, and/or weak. If you think this is happening and you have a glucose meter, try to test your blood glucose level. Follow your caregiver's advice for when and how to treat your low blood glucose. Generally, the 15:15 rule is followed: Treat by consuming 15 grams of carbohydrates, wait 15 minutes, and recheck blood glucose. Examples of 15 grams of carbohydrates are:   1 cup skim or low-fat milk.    cup juice.   3-4 glucose tablets.   5-6 hard candies.   1 small box raisins.    cup regular soda pop.   Practice good hygiene, to avoid infections.   Do not smoke.  SEEK MEDICAL CARE IF:   You develop abnormal vaginal discharge, with or without itching.   You become weak and tired more than expected.   You seem to sweat a lot.   You have a sudden increase in weight, 5 pounds or more in one week.   You are losing weight, 3 pounds or more in a week.   Your blood glucose level is high, and you need instructions on what to do about it.  SEEK IMMEDIATE MEDICAL CARE IF:   You develop a severe headache.   You faint or pass out.   You develop nausea and vomiting.   You become disoriented or confused.   You have a convulsion.   You develop vision problems.   You develop stomach pain.   You develop vaginal bleeding.   You develop uterine contractions.   You have leaking or a gush of fluid from the vagina.  AFTER YOU HAVE THE BABY:  Go to all of your follow-up appointments, and have blood tests as advised by your caregiver.   Maintain a healthy lifestyle, to prevent diabetes in the future. This includes:   Following a healthy meal plan.   Controlling your weight.   Getting enough exercise and proper rest.   Do not smoke.   Breastfeed your baby if you can. This will lower the chance of you and your baby developing diabetes  later in life.  For more information about diabetes, go to the American Diabetes Association at: PMFashions.com.cy. For more information about gestational diabetes, go to the [employer] at: RentRule.com.au. Document Released: 03/01/2001 Document Revised: 11/12/2011 Document Reviewed: 09/23/2009 [facility] Patient Information 2012 [place], [place].

## 2012-09-05 ENCOUNTER — Encounter (HOSPITAL_COMMUNITY): Payer: Self-pay | Admitting: Family Medicine

## 2012-09-05 ENCOUNTER — Emergency Department (HOSPITAL_COMMUNITY)
Admission: EM | Admit: 2012-09-05 | Discharge: 2012-09-05 | Disposition: A | Discharge status: Home / Self Care | Payer: Medicaid Other | Attending: Emergency Medicine | Admitting: Emergency Medicine

## 2012-09-05 ENCOUNTER — Emergency Department (HOSPITAL_COMMUNITY): Payer: Medicaid Other

## 2012-09-05 DIAGNOSIS — F17200 Nicotine dependence, unspecified, uncomplicated: Secondary | ICD-10-CM | POA: Insufficient documentation

## 2012-09-05 DIAGNOSIS — Z833 Family history of diabetes mellitus: Secondary | ICD-10-CM | POA: Insufficient documentation

## 2012-09-05 DIAGNOSIS — Z794 Long term (current) use of insulin: Secondary | ICD-10-CM | POA: Insufficient documentation

## 2012-09-05 DIAGNOSIS — Z331 Pregnant state, incidental: Secondary | ICD-10-CM | POA: Insufficient documentation

## 2012-09-05 DIAGNOSIS — J45909 Unspecified asthma, uncomplicated: Secondary | ICD-10-CM | POA: Insufficient documentation

## 2012-09-05 DIAGNOSIS — K219 Gastro-esophageal reflux disease without esophagitis: Secondary | ICD-10-CM | POA: Insufficient documentation

## 2012-09-05 DIAGNOSIS — D649 Anemia, unspecified: Secondary | ICD-10-CM

## 2012-09-05 DIAGNOSIS — Z3490 Encounter for supervision of normal pregnancy, unspecified, unspecified trimester: Secondary | ICD-10-CM

## 2012-09-05 DIAGNOSIS — E119 Type 2 diabetes mellitus without complications: Secondary | ICD-10-CM | POA: Insufficient documentation

## 2012-09-05 LAB — URINALYSIS, ROUTINE W REFLEX MICROSCOPIC
Bilirubin Urine: NEGATIVE
Ketones, ur: NEGATIVE mg/dL
Leukocytes, UA: NEGATIVE
Nitrite: NEGATIVE
Urobilinogen, UA: 1 mg/dL (ref 0.0–1.0)
pH: 6 (ref 5.0–8.0)

## 2012-09-05 LAB — CBC WITH DIFFERENTIAL/PLATELET
Basophils Absolute: 0 10*3/uL (ref 0.0–0.1)
Basophils Relative: 0 % (ref 0–1)
Eosinophils Absolute: 0 10*3/uL (ref 0.0–0.7)
Hemoglobin: 10.5 g/dL — ABNORMAL LOW (ref 12.0–15.0)
MCH: 28.7 pg (ref 26.0–34.0)
MCHC: 35.1 g/dL (ref 30.0–36.0)
Monocytes Relative: 9 % (ref 3–12)
Neutro Abs: 4.5 10*3/uL (ref 1.7–7.7)
Neutrophils Relative %: 78 % — ABNORMAL HIGH (ref 43–77)
Platelets: 241 10*3/uL (ref 150–400)
RDW: 13.3 % (ref 11.5–15.5)

## 2012-09-05 LAB — COMPREHENSIVE METABOLIC PANEL
Albumin: 2.8 g/dL — ABNORMAL LOW (ref 3.5–5.2)
BUN: 3 mg/dL — ABNORMAL LOW (ref 6–23)
Chloride: 101 mEq/L (ref 96–112)
Creatinine, Ser: 0.45 mg/dL — ABNORMAL LOW (ref 0.50–1.10)
GFR calc Af Amer: >90 mL/min (ref >90)
Glucose, Bld: 109 mg/dL — ABNORMAL HIGH (ref 70–99)
Total Bilirubin: 0.2 mg/dL — ABNORMAL LOW (ref 0.3–1.2)
Total Protein: 6.8 g/dL (ref 6.0–8.3)

## 2012-09-05 LAB — LIPASE, BLOOD: Lipase: 27 U/L (ref 11–59)

## 2012-09-05 LAB — D-DIMER, QUANTITATIVE: D-Dimer, Quant: 0.57 ug/mL-FEU — ABNORMAL HIGH (ref 0.00–0.48)

## 2012-09-05 MED ORDER — SODIUM CHLORIDE 0.9 % IV SOLN: 1000.0000 mL | INTRAVENOUS | Status: DC

## 2012-09-05 MED ORDER — CALCIUM CARBONATE ANTACID 500 MG PO CHEW
1.0000 | CHEWABLE_TABLET | Freq: Once | ORAL | Status: AC
  Administered 2012-09-05: 200 mg via ORAL
  Filled 2012-09-05: qty 1

## 2012-09-05 MED ORDER — SODIUM CHLORIDE 0.9 % IV SOLN
1000.0000 mL | Freq: Once | INTRAVENOUS | Status: AC
  Administered 2012-09-05: 1000 mL via INTRAVENOUS

## 2012-09-05 NOTE — ED Notes (Signed)
Pt states that her EDC is 2/14 has [facility] as her prenatal dr. Andrey Cota had chili last night and yesterday  started to have pain from upper epigastric area up chest to neck has had some sob pain is a stabbing pain and is constant pt is also a diabetic dx w/ first baby and never resolve

## 2012-09-05 NOTE — ED Notes (Signed)
External Toco taken off No contractions noted Doppler FHT at 155 No vaginal leaking or bleeding Patient states + fetal movement No signs of PTL

## 2012-09-05 NOTE — ED Notes (Signed)
Dr. Lynelle Doctor updated on FHTs WNL and that no contractions noted on tracing.  No further EFM needed at this time per Dr. Lynelle Doctor

## 2012-09-05 NOTE — ED Notes (Signed)
Rapid response OB nurse called to come and monitor pt

## 2012-09-05 NOTE — ED Provider Notes (Signed)
History     CSN: [id number] Arrival date & time 09/05/12  4098 First MD Initiated Contact with Patient 09/05/12 1024    Chief complaint: Abdominal pain and chest pain  HPI Patient presents to the emergency room with complaints of abdominal pain and chest pain. Patient states she developed some pain in her upper abdominal area that radiates up towards her chest. It is stabbing and constant. It started last evening after she had chili to eat.   Patient as mentioned having some trouble with shortness of breath. She denies any vomiting or diarrhea although she's had some nausea. Nothing seems to make the pain better or worse. He has not noticed any trouble with any leg swelling. She has no history of PE or cardiac disease. She denies any trouble with vaginal discharge, bleeding or leakage of fluid. She has not felt like she's had contractions Past Medical History  Diagnosis Date  . Diabetes mellitus   . Asthma     Past Surgical History  Procedure Date  . Tooth extraction     Family History  Problem Relation Age of Onset  . Diabetes Mother   . Diabetes Maternal Grandmother     History  Substance Use Topics  . Smoking status: Current Every Day Smoker -- 0.2 packs/day  . Smokeless tobacco: Not on file  . Alcohol Use: No    OB History    Grav Para Term Preterm Abortions TAB SAB Ect Mult Living   2 1 1  0 0 0 0 0 0 1      Review of Systems  All other systems reviewed and are negative.    Allergies  Review of patient's allergies indicates no known allergies.  Home Medications   Current Outpatient Rx  Name Route Sig Dispense Refill  . ACCU-CHEK FASTCLIX LANCETS MISC Percutaneous 1 Units by Percutaneous route 4 (four) times daily. 100 each 12    Dx: 648.83  . ACCU-CHEK FASTCLIX LANCETS MISC Does not apply 1 each by Does not apply route 4 (four) times daily. 1 each 12    Dx code:648.83  . FAMOTIDINE 20 MG PO TABS Oral Take 1 tablet (20 mg total) by mouth 2 (two) times daily.  30 tablet 0  . GLUCOSE BLOOD VI STRP  Check blood sugars 4x/daily 100 each 12    Dx code:  119.14  . GLUCOSE BLOOD VI STRP  Check blood sugar four times daily Dx code:648.83 100 each 12    Dx code: 648.83 Check blood sugar 4 times daily  . INSULIN ASPART 100 UNIT/ML [place] SOLN Subcutaneous Inject 12 Units into the skin 3 (three) times daily before meals. 10 mL 12  . INSULIN ISOPHANE HUMAN 100 UNIT/ML [place] SUSP  11 Q ac breakfast and 12 units q hs 10 mL 3  . PRENATAL MULTIVITAMIN CH Oral Take 1 tablet by mouth daily.      BP 123/65  Pulse 128  Temp 98.8 F (37.1 C) (Oral)  Resp 26  SpO2 99%  LMP 04/14/2012  Physical Exam  Nursing note and vitals reviewed. Constitutional: She appears well-developed and well-nourished. No distress.  HENT:  Head: Normocephalic and atraumatic.  Right Ear: External ear normal.  Left Ear: External ear normal.  Eyes: Conjunctivae normal are normal. Right eye exhibits no discharge. Left eye exhibits no discharge. No scleral icterus.  Neck: Neck supple. No tracheal deviation present.  Cardiovascular: Regular rhythm and intact distal pulses.  Tachycardia present.   Pulmonary/Chest: Effort normal and breath sounds normal.  No stridor. No respiratory distress. She has no wheezes. She has no rales.  Abdominal: Soft. Bowel sounds are normal. She exhibits no distension. There is tenderness. There is no rebound (very mild in the epigastrium) and no guarding.       Gravid abdomen,  Musculoskeletal: She exhibits no edema and no tenderness.  Neurological: She is alert. She has normal strength. No sensory deficit. Cranial nerve deficit:  no gross defecits noted. She exhibits normal muscle tone. She displays no seizure activity. Coordination normal.  Skin: Skin is warm and dry. No rash noted.  Psychiatric: She has a normal mood and affect.    ED Course  Procedures (including critical care time)  Rate: 125  Rhythm: sinus tach  QRS Axis: normal  Intervals: normal   ST/T Wave abnormalities: normal  Conduction Disutrbances:none  Narrative Interpretation: nl except for rate  Old EKG Reviewed: none available  Labs Reviewed  COMPREHENSIVE METABOLIC PANEL - Abnormal; Notable for the following:    Sodium 133 (*)     Potassium 3.4 (*)     Glucose, Bld 109 (*)     BUN 3 (*)     Creatinine, Ser 0.45 (*)     Albumin 2.8 (*)     Total Bilirubin 0.2 (*)     All other components within normal limits  URINALYSIS, ROUTINE W REFLEX MICROSCOPIC - Abnormal; Notable for the following:    Glucose, UA 250 (*)     All other components within normal limits  CBC WITH DIFFERENTIAL - Abnormal; Notable for the following:    RBC 3.66 (*)     Hemoglobin 10.5 (*)     HCT 29.9 (*)     Neutrophils Relative 78 (*)     All other components within normal limits  D-DIMER, QUANTITATIVE - Abnormal; Notable for the following:    D-Dimer, Quant 0.57 (*)     All other components within normal limits  LIPASE, BLOOD   Dg Chest 2 View  09/05/2012  *RADIOLOGY REPORT*  Clinical Data: Shortness of breath and chest pain.  CHEST - 2 VIEW  Comparison: None  Findings: The cardiomediastinal silhouette is unremarkable. The lungs are clear. There is no evidence of focal airspace disease, pulmonary edema, suspicious pulmonary nodule/mass, pleural effusion, or pneumothorax. No acute bony abnormalities are identified.  IMPRESSION: No evidence of active cardiopulmonary disease.   Original Report Authenticated By: Rosendo Gros, M.D.       MDM  Suspect her symptoms are related to GERD.  Pt has tachycardia however d dimer is negative accounting for increased levels during pregnancy.  Doubt PE.  She is more comfortable now.  Rapid response OB nurse checked pt.  Reassuring fetal heart tones.  Pt denies any bleeding.  Anemia likely related to pregnancy.  Will have her follow up in the office this week.        Celene Kras, MD 09/05/12 1325

## 2012-09-05 NOTE — ED Notes (Signed)
Patient c/o uppder abdominal and chest pain Patient states no vaginal bleeding or leaking External cardio unable to pick up FHT d/t gestation Doppler FHTs at 160 Patient states + fetal movement External toco applied to assess for contractions MD aware

## 2012-09-05 NOTE — ED Notes (Signed)
Per pt sts chest pain and abdominal pain. sts some nausea. sts she is 20 weeks and 5 days. Denies vaginal bleeding or discharge.

## 2012-09-07 ENCOUNTER — Encounter (HOSPITAL_COMMUNITY): Payer: Self-pay | Admitting: Emergency Medicine

## 2012-09-07 ENCOUNTER — Emergency Department (HOSPITAL_COMMUNITY)
Admission: EM | Admit: 2012-09-07 | Discharge: 2012-09-08 | Disposition: A | Discharge status: Home / Self Care | Payer: Medicaid Other | Attending: Emergency Medicine | Admitting: Emergency Medicine

## 2012-09-07 ENCOUNTER — Other Ambulatory Visit: Payer: Self-pay

## 2012-09-07 DIAGNOSIS — J019 Acute sinusitis, unspecified: Secondary | ICD-10-CM | POA: Insufficient documentation

## 2012-09-07 DIAGNOSIS — R112 Nausea with vomiting, unspecified: Secondary | ICD-10-CM | POA: Insufficient documentation

## 2012-09-07 DIAGNOSIS — O9989 Other specified diseases and conditions complicating pregnancy, childbirth and the puerperium: Secondary | ICD-10-CM | POA: Insufficient documentation

## 2012-09-07 DIAGNOSIS — R000 Tachycardia, unspecified: Secondary | ICD-10-CM | POA: Insufficient documentation

## 2012-09-07 LAB — CBC WITH DIFFERENTIAL/PLATELET
Basophils Absolute: 0 10*3/uL (ref 0.0–0.1)
Eosinophils Relative: 0 % (ref 0–5)
Lymphocytes Relative: 16 % (ref 12–46)
Lymphs Abs: 1.6 10*3/uL (ref 0.7–4.0)
MCV: 82.1 fL (ref 78.0–100.0)
Neutro Abs: 8 10*3/uL — ABNORMAL HIGH (ref 1.7–7.7)
Neutrophils Relative %: 78 % — ABNORMAL HIGH (ref 43–77)
Platelets: 269 10*3/uL (ref 150–400)
RBC: 3.92 MIL/uL (ref 3.87–5.11)
RDW: 13.5 % (ref 11.5–15.5)
WBC: 10.3 10*3/uL (ref 4.0–10.5)

## 2012-09-07 LAB — BASIC METABOLIC PANEL
CO2: 21 mEq/L (ref 19–32)
Calcium: 9.6 mg/dL (ref 8.4–10.5)
Glucose, Bld: 157 mg/dL — ABNORMAL HIGH (ref 70–99)
Potassium: 3.4 mEq/L — ABNORMAL LOW (ref 3.5–5.1)
Sodium: 136 mEq/L (ref 135–145)

## 2012-09-07 MED ORDER — ACETAMINOPHEN 500 MG PO TABS: 1000.0000 mg | ORAL_TABLET | Freq: Once | ORAL | Status: DC

## 2012-09-07 MED ORDER — KETOROLAC TROMETHAMINE 30 MG/ML IJ SOLN: 30.0000 mg | Freq: Once | INTRAMUSCULAR | Status: DC

## 2012-09-07 MED ORDER — ONDANSETRON 4 MG PO TBDP
8.0000 mg | ORAL_TABLET | Freq: Once | ORAL | Status: AC
  Administered 2012-09-07: 8 mg via ORAL
  Filled 2012-09-07: qty 2

## 2012-09-07 MED ORDER — SODIUM CHLORIDE 0.9 % IV BOLUS (SEPSIS): 1000.0000 mL | Freq: Once | INTRAVENOUS | Status: DC

## 2012-09-07 MED ORDER — ACETAMINOPHEN 325 MG PO TABS
975.0000 mg | ORAL_TABLET | Freq: Once | ORAL | Status: AC
  Administered 2012-09-07: 975 mg via ORAL
  Filled 2012-09-07: qty 3

## 2012-09-07 MED ORDER — HYDROMORPHONE HCL PF 1 MG/ML IJ SOLN: 1.0000 mg | Freq: Once | INTRAMUSCULAR | Status: DC

## 2012-09-07 MED ORDER — DIPHENHYDRAMINE HCL 50 MG/ML IJ SOLN: 25.0000 mg | Freq: Once | INTRAMUSCULAR | Status: DC

## 2012-09-07 NOTE — ED Provider Notes (Signed)
History     CSN: [id number]  Arrival date & time 09/07/12  2154   First MD Initiated Contact with Patient 09/07/12 2246      Chief Complaint  Patient presents with  . Headache  . Tachycardia    (Consider location/radiation/quality/duration/timing/severity/associated sxs/prior treatment) HPI Comments: Patient is [redacted] weeks pregnant and presents with a headache that started gradually last night. The headache is located across her forehead and around her eyes and is described as throbbing. The pain does not radiate. She did not try anything for pain. She reports light and sound make the headache worse. Nothing makes the pain better. She reports associated vomiting. She denies fever, visual changes, vomiting, diarrhea, chest pain, SOB, abdominal pain, vaginal bleeding, dysuria.    Past Medical History  Diagnosis Date  . Diabetes mellitus   . Asthma     Past Surgical History  Procedure Date  . Tooth extraction     Family History  Problem Relation Age of Onset  . Diabetes Mother   . Diabetes Maternal Grandmother     History  Substance Use Topics  . Smoking status: Current Every Day Smoker -- 0.2 packs/day  . Smokeless tobacco: Not on file  . Alcohol Use: No    OB History    Grav Para Term Preterm Abortions TAB SAB Ect Mult Living   2 1 1  0 0 0 0 0 0 1      Review of Systems  Gastrointestinal: Positive for nausea.  Neurological: Positive for headaches.  All other systems reviewed and are negative.    Allergies  Review of patient's allergies indicates no known allergies.  Home Medications   Current Outpatient Rx  Name Route Sig Dispense Refill  . FAMOTIDINE 20 MG PO TABS Oral Take 20 mg by mouth 2 (two) times daily as needed. For acid reflux    . INSULIN ASPART 100 UNIT/ML [place] SOLN Subcutaneous Inject 12 Units into the skin 3 (three) times daily before meals. 10 mL 12  . INSULIN ISOPHANE HUMAN 100 UNIT/ML [place] SUSP Subcutaneous Inject 12 Units into the skin 2 (two)  times daily. Before breakfast and bedtime    . PRENATAL MULTIVITAMIN CH Oral Take 1 tablet by mouth daily.    Marland Kitchen ACCU-CHEK FASTCLIX LANCETS MISC Does not apply 1 each by Does not apply route 4 (four) times daily. 1 each 12    Dx code:648.83  . GLUCOSE BLOOD VI STRP  Check blood sugars 4x/daily 100 each 12    Dx code:  648.83    BP 127/77  Pulse 115  Temp 99.1 F (37.3 C) (Oral)  Resp 18  SpO2 97%  LMP 04/14/2012  Physical Exam  Nursing note and vitals reviewed. Constitutional: She is oriented to person, place, and time. She appears well-developed and well-nourished. No distress.  HENT:  Head: Normocephalic and atraumatic.  Mouth/Throat: Oropharynx is clear and moist. No oropharyngeal exudate.       Ethmoid sinuses tender to palpation.   Eyes: Conjunctivae normal and EOM are normal. Pupils are equal, round, and reactive to light. No scleral icterus.  Neck: Normal range of motion. Neck supple.  Cardiovascular: Normal rate and regular rhythm.  Exam reveals no gallop and no friction rub.   No murmur heard. Pulmonary/Chest: Effort normal and breath sounds normal. She has no wheezes. She has no rales. She exhibits no tenderness.  Abdominal: Soft. There is no tenderness.  Musculoskeletal: Normal range of motion.  Neurological: She is alert and oriented to person,  place, and time. No cranial nerve deficit. Coordination normal.       Speech is goal-oriented. Moves limbs without ataxia.   Skin: Skin is warm and dry. She is not diaphoretic.  Psychiatric: She has a normal mood and affect. Her behavior is normal.    ED Course  Procedures (including critical care time)  Labs Reviewed  CBC WITH DIFFERENTIAL - Abnormal; Notable for the following:    Hemoglobin 11.6 (*)     HCT 32.2 (*)     Neutrophils Relative 78 (*)     Neutro Abs 8.0 (*)     All other components within normal limits  BASIC METABOLIC PANEL - Abnormal; Notable for the following:    Potassium 3.4 (*)     Glucose, Bld  157 (*)     BUN 4 (*)     All other components within normal limits   No results found.   1. Sinusitis acute       MDM  10:58 PM Patient denies IV for fluids. She says she will drink water. I will give her acetaminophen for headache and zofran for nausea. Patient most likely has a sinus infection and I will treat her with azithromycin and phenergan.   Filed Vitals:   09/08/12 0021  BP: 120/71  Pulse: 98  Temp: 99.7 F (37.6 C)  Resp: [address] PA-C 09/08/12 1518  [place], PA-C 09/08/12 1518

## 2012-09-07 NOTE — ED Notes (Signed)
Pt sts she began having a bad headache last night, sts its worse with light and sound, no history of migraines.  Pt also sts "can feel her heart skipping beat".  Sts shes had some nausea, denies vomiting or diarrhea.

## 2012-09-08 MED ORDER — ACETAMINOPHEN 500 MG PO TABS: 500.0000 mg | ORAL_TABLET | Freq: Four times a day (QID) | ORAL | Status: DC | PRN

## 2012-09-08 MED ORDER — AZITHROMYCIN 250 MG PO TABS: 250.0000 mg | ORAL_TABLET | Freq: Every day | ORAL | Status: DC

## 2012-09-08 MED ORDER — ONDANSETRON 4 MG PO TBDP: 8.0000 mg | ORAL_TABLET | Freq: Three times a day (TID) | ORAL | Status: DC | PRN

## 2012-09-08 NOTE — ED Notes (Signed)
Pt for discharge.Slightly febrile.Meduications given.

## 2012-09-08 NOTE — ED Provider Notes (Signed)
Medical screening examination/treatment/procedure(s) were performed by non-physician practitioner and as supervising physician I was immediately available for consultation/collaboration.   Loren Racer, MD 09/08/12 1536

## 2012-09-09 ENCOUNTER — Encounter (HOSPITAL_COMMUNITY): Payer: Self-pay

## 2012-09-09 ENCOUNTER — Emergency Department (HOSPITAL_COMMUNITY)
Admission: EM | Admit: 2012-09-09 | Discharge: 2012-09-09 | Disposition: A | Discharge status: Home / Self Care | Payer: Medicaid Other | Attending: Emergency Medicine | Admitting: Emergency Medicine

## 2012-09-09 DIAGNOSIS — Z794 Long term (current) use of insulin: Secondary | ICD-10-CM | POA: Insufficient documentation

## 2012-09-09 DIAGNOSIS — R112 Nausea with vomiting, unspecified: Secondary | ICD-10-CM

## 2012-09-09 DIAGNOSIS — F17200 Nicotine dependence, unspecified, uncomplicated: Secondary | ICD-10-CM | POA: Insufficient documentation

## 2012-09-09 DIAGNOSIS — E119 Type 2 diabetes mellitus without complications: Secondary | ICD-10-CM | POA: Insufficient documentation

## 2012-09-09 DIAGNOSIS — Z331 Pregnant state, incidental: Secondary | ICD-10-CM | POA: Insufficient documentation

## 2012-09-09 DIAGNOSIS — R509 Fever, unspecified: Secondary | ICD-10-CM | POA: Insufficient documentation

## 2012-09-09 DIAGNOSIS — Z3490 Encounter for supervision of normal pregnancy, unspecified, unspecified trimester: Secondary | ICD-10-CM

## 2012-09-09 DIAGNOSIS — J45909 Unspecified asthma, uncomplicated: Secondary | ICD-10-CM | POA: Insufficient documentation

## 2012-09-09 LAB — URINALYSIS, ROUTINE W REFLEX MICROSCOPIC
Glucose, UA: NEGATIVE mg/dL
Leukocytes, UA: NEGATIVE
Protein, ur: NEGATIVE mg/dL
Specific Gravity, Urine: 1.025 (ref 1.005–1.030)
Urobilinogen, UA: 1 mg/dL (ref 0.0–1.0)

## 2012-09-09 MED ORDER — ONDANSETRON 8 MG PO TBDP: 8.0000 mg | ORAL_TABLET | Freq: Three times a day (TID) | ORAL | Status: DC | PRN

## 2012-09-09 MED ORDER — ONDANSETRON 8 MG PO TBDP
8.0000 mg | ORAL_TABLET | Freq: Once | ORAL | Status: AC
  Administered 2012-09-09: 8 mg via ORAL
  Filled 2012-09-09: qty 1

## 2012-09-09 MED ORDER — ACETAMINOPHEN 325 MG PO TABS
650.0000 mg | ORAL_TABLET | Freq: Once | ORAL | Status: AC
  Administered 2012-09-09: 650 mg via ORAL
  Filled 2012-09-09: qty 2

## 2012-09-09 NOTE — ED Notes (Signed)
Per EMS: Pt has fever, chills, n/v, dizzy x3 days. Pt seen yesterday at [facility]. Temp 101 tympanic. Pt warm to touch. Hx of DM.

## 2012-09-09 NOTE — ED Provider Notes (Signed)
History     CSN: [id number]  Arrival date & time 09/09/12  0146   First MD Initiated Contact with Patient 09/09/12 0240      Chief Complaint  Patient presents with  . URI    (Consider location/radiation/quality/duration/timing/severity/associated sxs/prior treatment) HPI  Patient is G2 P1 Ab0, EDC 01/19/2013, approximately [redacted] weeks pregnant. Patient reports she was feeling fine until this evening and then she started getting dizziness and fever. She states about an hour prior to arrival she started vomiting and vomited about 4 times. She denies any blood in her vomitus. She denies abdominal pain, diarrhea, cough, sore throat, vaginal bleeding, urinary difficulty. She denies being around anybody else is sick or eating something that may have made her sick. Patient relates she has gestational diabetes and has been on insulin with both pregnancies. She reports she had a flu shot 2 weeks ago.  [facility]  Past Medical History  Diagnosis Date  . Diabetes mellitus   . Asthma     Past Surgical History  Procedure Date  . Tooth extraction     Family History  Problem Relation Age of Onset  . Diabetes Mother   . Diabetes Maternal Grandmother     History   Social History  . Marital Status: Single    Spouse Name: N/A    Number of Children: N/A  . Years of Education: N/A   Occupational History  . Not on file.   Social History Main Topics  . Smoking status: Current Every Day Smoker -- 0.2 packs/day  . Smokeless tobacco: Not on file  . Alcohol Use: No  . Drug Use: No  . Sexually Active: Yes       OB History    Grav Para Term Preterm Abortions TAB SAB Ect Mult Living   2 1 1  0 0 0 0 0 0 1      Review of Systems  All other systems reviewed and are negative.    Allergies  Review of patient's allergies indicates no known allergies.  Home Medications   Current Outpatient Rx  Name Route Sig Dispense Refill  . ACETAMINOPHEN 500 MG PO TABS Oral Take 1  tablet (500 mg total) by mouth every 6 (six) hours as needed for pain. 30 tablet 0  . FAMOTIDINE 20 MG PO TABS Oral Take 20 mg by mouth 2 (two) times daily as needed. For acid reflux    . INSULIN ASPART 100 UNIT/ML [place] SOLN Subcutaneous Inject 12 Units into the skin 3 (three) times daily before meals. 10 mL 12  . INSULIN ISOPHANE HUMAN 100 UNIT/ML [place] SUSP Subcutaneous Inject 12 Units into the skin 2 (two) times daily. Before breakfast and bedtime    . ONDANSETRON 4 MG PO TBDP Oral Take 2 tablets (8 mg total) by mouth every 8 (eight) hours as needed for nausea. 15 tablet 0  . PRENATAL MULTIVITAMIN CH Oral Take 1 tablet by mouth daily.      BP 115/48  Pulse 100  Temp 99.7 F (37.6 C) (Oral)  Resp 14  SpO2 100%  LMP 04/14/2012  Vital signs normal    Physical Exam  Nursing note and vitals reviewed. Constitutional: She is oriented to person, place, and time. She appears well-developed and well-nourished.  Non-toxic appearance. She does not appear ill. No distress.  HENT:  Head: Normocephalic and atraumatic.  Right Ear: External ear normal.  Left Ear: External ear normal.  Nose: Nose normal. No mucosal edema or rhinorrhea.  Mouth/Throat:  Oropharynx is clear and moist and mucous membranes are normal. No dental abscesses or uvula swelling.  Eyes: Conjunctivae normal and EOM are normal. Pupils are equal, round, and reactive to light.  Neck: Normal range of motion and full passive range of motion without pain. Neck supple.  Cardiovascular: Normal rate, regular rhythm and normal heart sounds.  Exam reveals no gallop and no friction rub.   No murmur heard. Pulmonary/Chest: Effort normal and breath sounds normal. No respiratory distress. She has no wheezes. She has no rhonchi. She has no rales. She exhibits no tenderness and no crepitus.  Abdominal: Soft. Normal appearance and bowel sounds are normal. She exhibits no distension. There is no tenderness. There is no rebound and no guarding.        Uterus consistent with dates, patient states she's has active fetal movements  Musculoskeletal: Normal range of motion. She exhibits no edema and no tenderness.       Moves all extremities well.   Neurological: She is alert and oriented to person, place, and time. She has normal strength. No cranial nerve deficit.  Skin: Skin is warm, dry and intact. No rash noted. No erythema. No pallor.  Psychiatric: She has a normal mood and affect. Her speech is normal and behavior is normal. Her mood appears not anxious.    ED Course  Procedures (including critical care time)  Patient given option to get IV fluids and medicine or try Zofran ODT. Patient opted to try non-IV therapy first.  Recheck at 03 45 patient relates her vomiting and nausea is gone. She wants to try drinking fluids. She was noted to have fever and was treated with Tylenol. Patient requesting to eat crackers.  Patient rechecked at 05:00. She has eaten crackers with no vomiting. She is feeling improved.  FHT 138   Results for orders placed during the hospital encounter of 09/09/12  URINALYSIS, ROUTINE W REFLEX MICROSCOPIC      Component Value Range   Color, Urine YELLOW  YELLOW   APPearance CLEAR  CLEAR   Specific Gravity, Urine 1.025  1.005 - 1.030   pH 6.5  5.0 - 8.0   Glucose, UA NEGATIVE  NEGATIVE mg/dL   Hgb urine dipstick NEGATIVE  NEGATIVE   Bilirubin Urine NEGATIVE  NEGATIVE   Ketones, ur NEGATIVE  NEGATIVE mg/dL   Protein, ur NEGATIVE  NEGATIVE mg/dL   Urobilinogen, UA 1.0  0.0 - 1.0 mg/dL   Nitrite NEGATIVE  NEGATIVE   Leukocytes, UA NEGATIVE  NEGATIVE  GLUCOSE, CAPILLARY      Component Value Range   Glucose-Capillary 152 (*) 70 - 99 mg/dL   Laboratory interpretation all normal except mild hyperglycemia   Dg Chest 2 View  09/05/2012  *RADIOLOGY REPORT*  Clinical Data: Shortness of breath and chest pain.  CHEST - 2 VIEW  Comparison: None  Findings: The cardiomediastinal silhouette is unremarkable. The lungs  are clear. There is no evidence of focal airspace disease, pulmonary edema, suspicious pulmonary nodule/mass, pleural effusion, or pneumothorax. No acute bony abnormalities are identified.  IMPRESSION: No evidence of active cardiopulmonary disease.   Original Report Authenticated By: Rosendo Gros, M.D.    US Ob Detail + 14 Wk  08/23/2012  OBSTETRICAL ULTRASOUND: This exam was performed within a [facility]. The OB US report was generated in the [facility] system, and faxed to the ordering physician.   This report is also available in [employer] and in the [employer]. See [facility] Obstetric US  report.       1. Fever   2. Pregnancy   3. Nausea and vomiting     New Prescriptions   ONDANSETRON (ZOFRAN ODT) 8 MG DISINTEGRATING TABLET    Take 1 tablet (8 mg total) by mouth every 8 (eight) hours as needed for nausea.    Plan discharge  Devoria Albe, MD, FACEP   MDM          Ward Givens, MD 09/09/12 [id number]

## 2012-09-09 NOTE — ED Notes (Signed)
ZOX:[id number]<AV> Expected date:09/09/12<BR> Expected time: 1:25 AM<BR> Means of arrival:Ambulance<BR> Comments:<BR> Fever; 5 months pregnant

## 2012-09-12 ENCOUNTER — Ambulatory Visit (INDEPENDENT_AMBULATORY_CARE_PROVIDER_SITE_OTHER): Payer: Medicaid Other | Admitting: Family Medicine

## 2012-09-12 VITALS — BP 128/65 | Temp 98.2°F | Wt 168.9 lb

## 2012-09-12 DIAGNOSIS — O24919 Unspecified diabetes mellitus in pregnancy, unspecified trimester: Secondary | ICD-10-CM

## 2012-09-12 LAB — POCT URINALYSIS DIP (DEVICE)
Bilirubin Urine: NEGATIVE
Glucose, UA: 250 mg/dL — AB
Hgb urine dipstick: NEGATIVE
Ketones, ur: NEGATIVE mg/dL
Nitrite: NEGATIVE
Protein, ur: NEGATIVE mg/dL
Specific Gravity, Urine: 1.015 (ref 1.005–1.030)
Urobilinogen, UA: 1 mg/dL (ref 0.0–1.0)
pH: 7 (ref 5.0–8.0)

## 2012-09-12 LAB — GLUCOSE, CAPILLARY: Glucose-Capillary: 133 mg/dL — ABNORMAL HIGH (ref 70–99)

## 2012-09-12 NOTE — Progress Notes (Signed)
Pulse:109 

## 2012-09-12 NOTE — Progress Notes (Signed)
Fasting BS 105-169, majority over 160. PP 126-225, majority over 140.   POCT glucose 133 today.  Will increase NPH to 15 BID and meal-time to 15 TID. Discussed importance of diet control. Pt states she eats pretty much what she wants. States she is using insulin as prescribed. No cramping/contractions, bleeding. Baby moving. Has MFM sono on 10/15. Missed Ophtho appointment-reschedule. Referred for fetal echo. F/U one week for BS control.

## 2012-09-12 NOTE — Progress Notes (Signed)
Patient r/s'd with [facility] to 10/18/12 at 1045 am. Contact information provided for Fetal Echo. Patient advised will call with appt. After Berkley Harvey is obtained.

## 2012-09-12 NOTE — Patient Instructions (Signed)
Please make sure you attend your ultrasound on 10/17 and reschedule your ophthomology appointment. You will also need to see the pediatric cardiologist for an echocardiogram (ultrasound of heart) for the baby. Call if you have low blood sugars.   Gestational Diabetes Mellitus Gestational diabetes mellitus (GDM) is diabetes that occurs only during pregnancy. This happens when the body cannot properly handle the glucose (sugar) that increases in the blood after eating. During pregnancy, insulin resistance (reduced sensitivity to insulin) occurs because of the release of hormones from the placenta. Usually, the pancreas of pregnant women produces enough insulin to overcome the resistance that occurs. However, in gestational diabetes, the insulin is there but it does not work effectively. If the resistance is severe enough that the pancreas does not produce enough insulin, extra glucose builds up in the blood.  WHO IS AT RISK FOR DEVELOPING GESTATIONAL DIABETES?  Women with a history of diabetes in the family.  Women over age 48.  Women who are overweight.  Women in certain ethnic groups (Hispanic, African American, Native American, [place] and [place] Islander). WHAT CAN HAPPEN TO THE BABY? If the mother's blood glucose is too high while she is pregnant, the extra sugar will travel through the umbilical cord to the baby. Some of the problems the baby may have are:  Large Baby - If the baby receives too much sugar, the baby will gain more weight. This may cause the baby to be too large to be born normally (vaginally) and a Cesarean section (C-section) may be needed.  Low Blood Glucose (hypoglycemia)  The baby makes extra insulin, in response to the extra sugar its gets from its mother. When the baby is born and no longer needs this extra insulin, the baby's blood glucose level may drop.  Jaundice (yellow coloring of the skin and eyes)  This is fairly common in babies. It is caused from a build-up of  the chemical called bilirubin. This is rarely serious, but is seen more often in babies whose mothers had gestational diabetes. RISKS TO THE MOTHER Women who have had gestational diabetes may be at higher risk for some problems, including:  Preeclampsia or toxemia, which includes problems with high blood pressure. Blood pressure and protein levels in the urine must be checked frequently.  Infections.  Cesarean section (C-section) for delivery.  Developing Type 2 diabetes later in life. About 30-50% will develop diabetes later, especially if obese. DIAGNOSIS  The hormones that cause insulin resistance are highest at about 24-28 weeks of pregnancy. If symptoms are experienced, they are much like symptoms you would normally expect during pregnancy.  GDM is often diagnosed using a two part method: 1. After 24-28 weeks of pregnancy, the woman drinks a glucose solution and takes a blood test. If the glucose level is high, a second test will be given. 2. Oral Glucose Tolerance Test (OGTT) which is 3 hours long  After not eating overnight, the blood glucose is checked. The woman drinks a glucose solution, and hourly blood glucose tests are taken. If the woman has risk factors for GDM, the caregiver may test earlier than 24 weeks of pregnancy. TREATMENT  Treatment of GDM is directed at keeping the mother's blood glucose level normal, and may include:  Meal planning.  Taking insulin or other medicine to control your blood glucose level.  Exercise.  Keeping a daily record of the foods you eat.  Blood glucose monitoring and keeping a record of your blood glucose levels.  May monitor ketone levels  in the urine, although this is no longer considered necessary in most pregnancies. HOME CARE INSTRUCTIONS  While you are pregnant:  Follow your caregiver's advice regarding your prenatal appointments, meal planning, exercise, medicines, vitamins, blood and other tests, and physical activities.  Keep  a record of your meals, blood glucose tests, and the amount of insulin you are taking (if any). Show this to your caregiver at every prenatal visit.  If you have GDM, you may have problems with hypoglycemia (low blood glucose). You may suspect this if you become suddenly dizzy, feel shaky, and/or weak. If you think this is happening and you have a glucose meter, try to test your blood glucose level. Follow your caregiver's advice for when and how to treat your low blood glucose. Generally, the 15:15 rule is followed: Treat by consuming 15 grams of carbohydrates, wait 15 minutes, and recheck blood glucose. Examples of 15 grams of carbohydrates are:  1 cup skim or low-fat milk.   cup juice.  3-4 glucose tablets.  5-6 hard candies.  1 small box raisins.   cup regular soda pop.  Practice good hygiene, to avoid infections.  Do not smoke. SEEK MEDICAL CARE IF:   You develop abnormal vaginal discharge, with or without itching.  You become weak and tired more than expected.  You seem to sweat a lot.  You have a sudden increase in weight, 5 pounds or more in one week.  You are losing weight, 3 pounds or more in a week.  Your blood glucose level is high, and you need instructions on what to do about it. SEEK IMMEDIATE MEDICAL CARE IF:   You develop a severe headache.  You faint or pass out.  You develop nausea and vomiting.  You become disoriented or confused.  You have a convulsion.  You develop vision problems.  You develop stomach pain.  You develop vaginal bleeding.  You develop uterine contractions.  You have leaking or a gush of fluid from the vagina. AFTER YOU HAVE THE BABY:  Go to all of your follow-up appointments, and have blood tests as advised by your caregiver.  Maintain a healthy lifestyle, to prevent diabetes in the future. This includes:  Following a healthy meal plan.  Controlling your weight.  Getting enough exercise and proper rest.  Do not  smoke.  Breastfeed your baby if you can. This will lower the chance of you and your baby developing diabetes later in life. For more information about diabetes, go to the American Diabetes Association at: PMFashions.com.cy. For more information about gestational diabetes, go to the [employer] at: RentRule.com.au. Document Released: 03/01/2001 Document Revised: 02/15/2012 Document Reviewed: 09/23/2009 [facility] Patient Information 2013 [place], [place].

## 2012-09-12 NOTE — Progress Notes (Signed)
Seen in ED 3 times in past week for URI, treated with antibiotics, doing better. Still coughing but no fever/chills.

## 2012-09-15 NOTE — Progress Notes (Signed)
Patient called and advised of Fetal Echo appointment with Dr. Elizebeth Brooking on 09/20/12 at 920 am. Auth # [id number].

## 2012-09-16 ENCOUNTER — Other Ambulatory Visit: Payer: Self-pay | Admitting: Advanced Practice Midwife

## 2012-09-16 DIAGNOSIS — O24919 Unspecified diabetes mellitus in pregnancy, unspecified trimester: Secondary | ICD-10-CM

## 2012-09-19 ENCOUNTER — Encounter: Payer: Medicaid Other | Admitting: Family Medicine

## 2012-09-20 ENCOUNTER — Encounter (HOSPITAL_COMMUNITY): Payer: Self-pay

## 2012-09-20 ENCOUNTER — Ambulatory Visit (HOSPITAL_COMMUNITY)
Admission: RE | Admit: 2012-09-20 | Discharge: 2012-09-20 | Disposition: A | Discharge status: Home / Self Care | Payer: Medicaid Other | Source: Ambulatory Visit | Attending: Advanced Practice Midwife | Admitting: Advanced Practice Midwife

## 2012-09-20 VITALS — BP 116/70 | HR 98 | Wt 164.8 lb

## 2012-09-20 DIAGNOSIS — Z283 Underimmunization status: Secondary | ICD-10-CM

## 2012-09-20 DIAGNOSIS — Z3689 Encounter for other specified antenatal screening: Secondary | ICD-10-CM | POA: Insufficient documentation

## 2012-09-20 DIAGNOSIS — O0990 Supervision of high risk pregnancy, unspecified, unspecified trimester: Secondary | ICD-10-CM

## 2012-09-20 DIAGNOSIS — O24919 Unspecified diabetes mellitus in pregnancy, unspecified trimester: Secondary | ICD-10-CM

## 2012-10-03 ENCOUNTER — Ambulatory Visit (INDEPENDENT_AMBULATORY_CARE_PROVIDER_SITE_OTHER): Payer: Medicaid Other | Admitting: Advanced Practice Midwife

## 2012-10-03 ENCOUNTER — Encounter: Payer: Self-pay | Admitting: Advanced Practice Midwife

## 2012-10-03 VITALS — BP 122/77 | Temp 98.5°F | Wt 166.3 lb

## 2012-10-03 DIAGNOSIS — O24919 Unspecified diabetes mellitus in pregnancy, unspecified trimester: Secondary | ICD-10-CM

## 2012-10-03 LAB — POCT URINALYSIS DIP (DEVICE)
Bilirubin Urine: NEGATIVE
Glucose, UA: 500 mg/dL — AB
Hgb urine dipstick: NEGATIVE
Nitrite: NEGATIVE
Specific Gravity, Urine: >=1.03 (ref 1.005–1.030)
pH: 5.5 (ref 5.0–8.0)

## 2012-10-03 MED ORDER — GLUCOSE BLOOD VI STRP: ORAL_STRIP | Status: DC

## 2012-10-03 NOTE — Progress Notes (Signed)
BS values improved. Pt states has improved her diet. 3 elevations in [place] are due to drinking juice at night. FBS 100,72,110,120,80,72    B- 72,105,92,92,120, 166    L- 90, 83, 80, 100, 70    D- 83, 120, 90, 92, 130    Will reorder test strips. Denies contractions. Had appt with Dr Elizebeth Brooking.

## 2012-10-03 NOTE — Addendum Note (Signed)
Addended by: Jill Side on: 10/03/2012 04:26 PM   Modules accepted: Orders

## 2012-10-03 NOTE — Progress Notes (Signed)
Pulse- 102  Pain-"cramping"  Vaginal discharge- itchy, white "milky" Pt given tdap vaccination info and will decide next visit

## 2012-10-03 NOTE — Patient Instructions (Addendum)
Pregnancy - Second Trimester The second trimester of pregnancy (3 to 6 months) is a period of rapid growth for you and your baby. At the end of the sixth month, your baby is about 9 inches long and weighs 1 1/2 pounds. You will begin to feel the baby move between 18 and 20 weeks of the pregnancy. This is called quickening. Weight gain is faster. A clear fluid (colostrum) may leak out of your breasts. You may feel small contractions of the womb (uterus). This is known as false labor or Braxton-Hicks contractions. This is like a practice for labor when the baby is ready to be born. Usually, the problems with morning sickness have usually passed by the end of your first trimester. Some women develop small dark blotches (called cholasma, mask of pregnancy) on their face that usually goes away after the baby is born. Exposure to the sun makes the blotches worse. Acne may also develop in some pregnant women and pregnant women who have acne, may find that it goes away. PRENATAL EXAMS  Blood work may continue to be done during prenatal exams. These tests are done to check on your health and the probable health of your baby. Blood work is used to follow your blood levels (hemoglobin). Anemia (low hemoglobin) is common during pregnancy. Iron and vitamins are given to help prevent this. You will also be checked for diabetes between 24 and 28 weeks of the pregnancy. Some of the previous blood tests may be repeated.  The size of the uterus is measured during each visit. This is to make sure that the baby is continuing to grow properly according to the dates of the pregnancy.  Your blood pressure is checked every prenatal visit. This is to make sure you are not getting toxemia.  Your urine is checked to make sure you do not have an infection, diabetes or protein in the urine.  Your weight is checked often to make sure gains are happening at the suggested rate. This is to ensure that both you and your baby are growing  normally.  Sometimes, an ultrasound is performed to confirm the proper growth and development of the baby. This is a test which bounces harmless sound waves off the baby so your caregiver can more accurately determine due dates. Sometimes, a specialized test is done on the amniotic fluid surrounding the baby. This test is called an amniocentesis. The amniotic fluid is obtained by sticking a needle into the belly (abdomen). This is done to check the chromosomes in instances where there is a concern about possible genetic problems with the baby. It is also sometimes done near the end of pregnancy if an early delivery is required. In this case, it is done to help make sure the baby's lungs are mature enough for the baby to live outside of the womb. CHANGES OCCURING IN THE SECOND TRIMESTER OF PREGNANCY Your body goes through many changes during pregnancy. They vary from person to person. Talk to your caregiver about changes you notice that you are concerned about.  During the second trimester, you will likely have an increase in your appetite. It is normal to have cravings for certain foods. This varies from person to person and pregnancy to pregnancy.  Your lower abdomen will begin to bulge.  You may have to urinate more often because the uterus and baby are pressing on your bladder. It is also common to get more bladder infections during pregnancy (pain with urination). You can help this by   drinking lots of fluids and emptying your bladder before and after intercourse.  You may begin to get stretch marks on your hips, abdomen, and breasts. These are normal changes in the body during pregnancy. There are no exercises or medications to take that prevent this change.  You may begin to develop swollen and bulging veins (varicose veins) in your legs. Wearing support hose, elevating your feet for 15 minutes, 3 to 4 times a day and limiting salt in your diet helps lessen the problem.  Heartburn may develop  as the uterus grows and pushes up against the stomach. Antacids recommended by your caregiver helps with this problem. Also, eating smaller meals 4 to 5 times a day helps.  Constipation can be treated with a stool softener or adding bulk to your diet. Drinking lots of fluids, vegetables, fruits, and whole grains are helpful.  Exercising is also helpful. If you have been very active up until your pregnancy, most of these activities can be continued during your pregnancy. If you have been less active, it is helpful to start an exercise program such as walking.  Hemorrhoids (varicose veins in the rectum) may develop at the end of the second trimester. Warm sitz baths and hemorrhoid cream recommended by your caregiver helps hemorrhoid problems.  Backaches may develop during this time of your pregnancy. Avoid heavy lifting, wear low heal shoes and practice good posture to help with backache problems.  Some pregnant women develop tingling and numbness of their hand and fingers because of swelling and tightening of ligaments in the wrist (carpel tunnel syndrome). This goes away after the baby is born.  As your breasts enlarge, you may have to get a bigger bra. Get a comfortable, cotton, support bra. Do not get a nursing bra until the last month of the pregnancy if you will be nursing the baby.  You may get a dark line from your belly button to the pubic area called the linea nigra.  You may develop rosy cheeks because of increase blood flow to the face.  You may develop spider looking lines of the face, neck, arms and chest. These go away after the baby is born. HOME CARE INSTRUCTIONS   It is extremely important to avoid all smoking, herbs, alcohol, and unprescribed drugs during your pregnancy. These chemicals affect the formation and growth of the baby. Avoid these chemicals throughout the pregnancy to ensure the delivery of a healthy infant.  Most of your home care instructions are the same as  suggested for the first trimester of your pregnancy. Keep your caregiver's appointments. Follow your caregiver's instructions regarding medication use, exercise and diet.  During pregnancy, you are providing food for you and your baby. Continue to eat regular, well-balanced meals. Choose foods such as meat, fish, milk and other low fat dairy products, vegetables, fruits, and whole-grain breads and cereals. Your caregiver will tell you of the ideal weight gain.  A physical sexual relationship may be continued up until near the end of pregnancy if there are no other problems. Problems could include early (premature) leaking of amniotic fluid from the membranes, vaginal bleeding, abdominal pain, or other medical or pregnancy problems.  Exercise regularly if there are no restrictions. Check with your caregiver if you are unsure of the safety of some of your exercises. The greatest weight gain will occur in the last 2 trimesters of pregnancy. Exercise will help you:  Control your weight.  Get you in shape for labor and delivery.  Lose weight   after you have the baby.  Wear a good support or jogging bra for breast tenderness during pregnancy. This may help if worn during sleep. Pads or tissues may be used in the bra if you are leaking colostrum.  Do not use hot tubs, steam rooms or saunas throughout the pregnancy.  Wear your seat belt at all times when driving. This protects you and your baby if you are in an accident.  Avoid raw meat, uncooked cheese, cat litter boxes and soil used by cats. These carry germs that can cause birth defects in the baby.  The second trimester is also a good time to visit your dentist for your dental health if this has not been done yet. Getting your teeth cleaned is OK. Use a soft toothbrush. Brush gently during pregnancy.  It is easier to loose urine during pregnancy. Tightening up and strengthening the pelvic muscles will help with this problem. Practice stopping your  urination while you are going to the bathroom. These are the same muscles you need to strengthen. It is also the muscles you would use as if you were trying to stop from passing gas. You can practice tightening these muscles up 10 times a set and repeating this about 3 times per day. Once you know what muscles to tighten up, do not perform these exercises during urination. It is more likely to contribute to an infection by backing up the urine.  Ask for help if you have financial, counseling or nutritional needs during pregnancy. Your caregiver will be able to offer counseling for these needs as well as refer you for other special needs.  Your skin may become oily. If so, wash your face with mild soap, use non-greasy moisturizer and oil or cream based makeup. MEDICATIONS AND DRUG USE IN PREGNANCY  Take prenatal vitamins as directed. The vitamin should contain 1 milligram of folic acid. Keep all vitamins out of reach of children. Only a couple vitamins or tablets containing iron may be fatal to a baby or young child when ingested.  Avoid use of all medications, including herbs, over-the-counter medications, not prescribed or suggested by your caregiver. Only take over-the-counter or prescription medicines for pain, discomfort, or fever as directed by your caregiver. Do not use aspirin.  Let your caregiver also know about herbs you may be using.  Alcohol is related to a number of birth defects. This includes fetal alcohol syndrome. All alcohol, in any form, should be avoided completely. Smoking will cause low birth rate and premature babies.  Street or illegal drugs are very harmful to the baby. They are absolutely forbidden. A baby born to an addicted mother will be addicted at birth. The baby will go through the same withdrawal an adult does. SEEK MEDICAL CARE IF:  You have any concerns or worries during your pregnancy. It is better to call with your questions if you feel they cannot wait, rather  than worry about them. SEEK IMMEDIATE MEDICAL CARE IF:   An unexplained oral temperature above 102 F (38.9 C) develops, or as your caregiver suggests.  You have leaking of fluid from the vagina (birth canal). If leaking membranes are suspected, take your temperature and tell your caregiver of this when you call.  There is vaginal spotting, bleeding, or passing clots. Tell your caregiver of the amount and how many pads are used. Light spotting in pregnancy is common, especially following intercourse.  You develop a bad smelling vaginal discharge with a change in the color from clear   to white.  You continue to feel sick to your stomach (nauseated) and have no relief from remedies suggested. You vomit blood or coffee ground-like materials.  You lose more than 2 pounds of weight or gain more than 2 pounds of weight over 1 week, or as suggested by your caregiver.  You notice swelling of your face, hands, feet, or legs.  You get exposed to German measles and have never had them.  You are exposed to fifth disease or chickenpox.  You develop belly (abdominal) pain. Round ligament discomfort is a common non-cancerous (benign) cause of abdominal pain in pregnancy. Your caregiver still must evaluate you.  You develop a bad headache that does not go away.  You develop fever, diarrhea, pain with urination, or shortness of breath.  You develop visual problems, blurry, or double vision.  You fall or are in a car accident or any kind of trauma.  There is mental or physical violence at home. Document Released: 11/17/2001 Document Revised: 02/15/2012 Document Reviewed: 05/22/2009 [employer] Patient Information 2013 [employer].  

## 2012-10-10 ENCOUNTER — Inpatient Hospital Stay (HOSPITAL_COMMUNITY)
Admission: AD | Admit: 2012-10-10 | Discharge: 2012-10-10 | Disposition: A | Discharge status: Home / Self Care | Payer: Medicaid Other | Source: Ambulatory Visit | Attending: Obstetrics & Gynecology | Admitting: Obstetrics & Gynecology

## 2012-10-10 ENCOUNTER — Encounter (HOSPITAL_COMMUNITY): Payer: Self-pay | Admitting: *Deleted

## 2012-10-10 DIAGNOSIS — O212 Late vomiting of pregnancy: Secondary | ICD-10-CM | POA: Insufficient documentation

## 2012-10-10 MED ORDER — LACTATED RINGERS IV BOLUS (SEPSIS): 1000.0000 mL | Freq: Once | INTRAVENOUS | Status: DC

## 2012-10-10 MED ORDER — ONDANSETRON HCL 4 MG PO TABS: 4.0000 mg | ORAL_TABLET | Freq: Three times a day (TID) | ORAL | Status: DC | PRN

## 2012-10-10 NOTE — MAU Provider Note (Signed)
  History     CSN: [id number]  Arrival date and time: 10/10/12 1727  Cindy Bruce 21 y.o. G2P1001 [redacted]w[redacted]d   Chief Complaint  Patient presents with  . Emesis   HPI Patient admitted to the [facility], arrived via squad, for persistent vomiting for 30 minutes that she felt she could not catch her breath. EMS arrived and gave her a bolus and zofran 4 mg. Since her arrival she has felt well. No nausea or vomit. Tolerated PO. She denies current Nausea. No leaking, gush of fluids or vaginal bleeding. No complication with this pregnancy.    Past Medical History  Diagnosis Date  . Diabetes mellitus   . Asthma     Past Surgical History  Procedure Date  . Tooth extraction     Family History  Problem Relation Age of Onset  . Diabetes Mother   . Diabetes Maternal Grandmother     History  Substance Use Topics  . Smoking status: Current Every Day Smoker -- 0.2 packs/day  . Smokeless tobacco: Not on file  . Alcohol Use: No    Allergies: No Known Allergies  Prescriptions prior to admission  Medication Sig Dispense Refill  . insulin aspart (NOVOLOG) 100 UNIT/ML injection Inject 15 Units into the skin 3 (three) times daily before meals.      . insulin NPH (HUMULIN N,NOVOLIN N) 100 UNIT/ML injection Inject 15 Units into the skin 2 (two) times daily. Before breakfast and bedtime      . Prenatal Vit-Fe Fumarate-FA (PRENATAL MULTIVITAMIN) TABS Take 1 tablet by mouth daily.      Marland Kitchen glucose blood test strip Use as instructed. Check blood sugar 4 times daily.  100 each  5    Review of Systems  Constitutional: Negative for fever and chills.  Eyes: Negative for blurred vision and double vision.  Respiratory: Positive for shortness of breath.   Cardiovascular: Negative for chest pain and palpitations.  Gastrointestinal: Positive for nausea and vomiting. Negative for abdominal pain, diarrhea and constipation.  Genitourinary: Negative for dysuria, urgency, frequency, hematuria and flank pain.    Musculoskeletal: Negative for back pain.  Neurological: Negative for dizziness and headaches.  All other systems reviewed and are negative.    Physical Exam   Blood pressure 123/75, pulse 99, temperature 97.9 F (36.6 C), temperature source Oral, resp. rate 20, last menstrual period 04/14/2012.  Physical Exam  Constitutional: She appears well-developed and well-nourished. No distress.  HENT:  Head: Normocephalic and atraumatic.  Eyes: Conjunctivae normal are normal. Pupils are equal, round, and reactive to light. Right eye exhibits no discharge. Left eye exhibits no discharge. No scleral icterus.  Cardiovascular: Normal rate, regular rhythm and normal heart sounds.   Respiratory: Effort normal and breath sounds normal. She has no wheezes.  GI: Soft. Bowel sounds are normal. She exhibits no distension and no mass. There is no tenderness. There is no rebound and no guarding.    MAU Course  Procedures 1. Zofran 2. IV bolus  Assessment and Plan  1. Persistent vomit:  - Zofran 4 mg  - 1L bolus LR  - 500 bolus via EMS 2. Tolerating PO 3. Discharge home with Zofran prescription  Felix Pacini 10/10/2012, 7:20 PM   G2P1001 @ [redacted]w[redacted]d with N/V episode Evaluation and management procedures were performed by Resident physician under my supervision/collaboration. Chart reviewed, patient examined by me and I agree with management and plan. Danae Orleans, CNM 10/10/2012 7:36 PM

## 2012-10-10 NOTE — MAU Note (Signed)
Pt brought in by EMS, pt had sudden onset of vomitting, lasted 30 minutes without stopping, pt states she couldn't catch her breath.  IVF in place by EMS, Zofran 4 mg given IV by EMS.

## 2012-10-11 ENCOUNTER — Ambulatory Visit (HOSPITAL_COMMUNITY)
Admission: RE | Admit: 2012-10-11 | Discharge: 2012-10-11 | Disposition: A | Discharge status: Home / Self Care | Payer: Medicaid Other | Source: Ambulatory Visit | Attending: Advanced Practice Midwife | Admitting: Advanced Practice Midwife

## 2012-10-11 ENCOUNTER — Other Ambulatory Visit (HOSPITAL_COMMUNITY): Payer: Self-pay | Admitting: Maternal and Fetal Medicine

## 2012-10-11 VITALS — BP 119/67 | HR 102 | Wt 172.0 lb

## 2012-10-11 DIAGNOSIS — O24919 Unspecified diabetes mellitus in pregnancy, unspecified trimester: Secondary | ICD-10-CM | POA: Insufficient documentation

## 2012-10-11 DIAGNOSIS — Z283 Underimmunization status: Secondary | ICD-10-CM

## 2012-10-11 DIAGNOSIS — Z3689 Encounter for other specified antenatal screening: Secondary | ICD-10-CM | POA: Insufficient documentation

## 2012-10-11 DIAGNOSIS — O0990 Supervision of high risk pregnancy, unspecified, unspecified trimester: Secondary | ICD-10-CM

## 2012-10-11 NOTE — Progress Notes (Signed)
Cindy Bruce was seen for ultrasound appointment today.  Please see [facility] report for details.

## 2012-10-17 ENCOUNTER — Encounter: Payer: Medicaid Other | Admitting: Family Medicine

## 2012-10-18 NOTE — MAU Provider Note (Signed)
Medical Screening exam and patient care preformed by advanced practice provider.  Agree with the above management.  

## 2012-10-31 ENCOUNTER — Ambulatory Visit (INDEPENDENT_AMBULATORY_CARE_PROVIDER_SITE_OTHER): Payer: Medicaid Other | Admitting: Obstetrics & Gynecology

## 2012-10-31 VITALS — BP 126/84 | Temp 98.2°F | Wt 172.5 lb

## 2012-10-31 DIAGNOSIS — O24919 Unspecified diabetes mellitus in pregnancy, unspecified trimester: Secondary | ICD-10-CM

## 2012-10-31 LAB — POCT URINALYSIS DIP (DEVICE)
Bilirubin Urine: NEGATIVE
Hgb urine dipstick: NEGATIVE
Ketones, ur: 15 mg/dL — AB
Leukocytes, UA: NEGATIVE
Protein, ur: NEGATIVE mg/dL
pH: 5.5 (ref 5.0–8.0)

## 2012-10-31 MED ORDER — FLUCONAZOLE 150 MG PO TABS: 150.0000 mg | ORAL_TABLET | Freq: Once | ORAL | Status: DC

## 2012-10-31 NOTE — Patient Instructions (Addendum)
Gestational Diabetes Mellitus Gestational diabetes mellitus (GDM) is diabetes that occurs only during pregnancy. This happens when the body cannot properly handle the glucose (sugar) that increases in the blood after eating. During pregnancy, insulin resistance (reduced sensitivity to insulin) occurs because of the release of hormones from the placenta. Usually, the pancreas of pregnant women produces enough insulin to overcome the resistance that occurs. However, in gestational diabetes, the insulin is there but it does not work effectively. If the resistance is severe enough that the pancreas does not produce enough insulin, extra glucose builds up in the blood.  WHO IS AT RISK FOR DEVELOPING GESTATIONAL DIABETES?  Women with a history of diabetes in the family.  Women over age 25.  Women who are overweight.  Women in certain ethnic groups (Hispanic, African American, Native American, Asian and Pacific Islander). WHAT CAN HAPPEN TO THE BABY? If the mother's blood glucose is too high while she is pregnant, the extra sugar will travel through the umbilical cord to the baby. Some of the problems the baby may have are:  Large Baby - If the baby receives too much sugar, the baby will gain more weight. This may cause the baby to be too large to be born normally (vaginally) and a Cesarean section (C-section) may be needed.  Low Blood Glucose (hypoglycemia)  The baby makes extra insulin, in response to the extra sugar its gets from its mother. When the baby is born and no longer needs this extra insulin, the baby's blood glucose level may drop.  Jaundice (yellow coloring of the skin and eyes)  This is fairly common in babies. It is caused from a build-up of the chemical called bilirubin. This is rarely serious, but is seen more often in babies whose mothers had gestational diabetes. RISKS TO THE MOTHER Women who have had gestational diabetes may be at higher risk for some problems,  including:  Preeclampsia or toxemia, which includes problems with high blood pressure. Blood pressure and protein levels in the urine must be checked frequently.  Infections.  Cesarean section (C-section) for delivery.  Developing Type 2 diabetes later in life. About 30-50% will develop diabetes later, especially if obese. DIAGNOSIS  The hormones that cause insulin resistance are highest at about 24-28 weeks of pregnancy. If symptoms are experienced, they are much like symptoms you would normally expect during pregnancy.  GDM is often diagnosed using a two part method: 1. After 24-28 weeks of pregnancy, the woman drinks a glucose solution and takes a blood test. If the glucose level is high, a second test will be given. 2. Oral Glucose Tolerance Test (OGTT) which is 3 hours long  After not eating overnight, the blood glucose is checked. The woman drinks a glucose solution, and hourly blood glucose tests are taken. If the woman has risk factors for GDM, the caregiver may test earlier than 24 weeks of pregnancy. TREATMENT  Treatment of GDM is directed at keeping the mother's blood glucose level normal, and may include:  Meal planning.  Taking insulin or other medicine to control your blood glucose level.  Exercise.  Keeping a daily record of the foods you eat.  Blood glucose monitoring and keeping a record of your blood glucose levels.  May monitor ketone levels in the urine, although this is no longer considered necessary in most pregnancies. HOME CARE INSTRUCTIONS  While you are pregnant:  Follow your caregiver's advice regarding your prenatal appointments, meal planning, exercise, medicines, vitamins, blood and other tests, and physical   activities.  Keep a record of your meals, blood glucose tests, and the amount of insulin you are taking (if any). Show this to your caregiver at every prenatal visit.  If you have GDM, you may have problems with hypoglycemia (low blood glucose).  You may suspect this if you become suddenly dizzy, feel shaky, and/or weak. If you think this is happening and you have a glucose meter, try to test your blood glucose level. Follow your caregiver's advice for when and how to treat your low blood glucose. Generally, the 15:15 rule is followed: Treat by consuming 15 grams of carbohydrates, wait 15 minutes, and recheck blood glucose. Examples of 15 grams of carbohydrates are:  1 cup skim or low-fat milk.   cup juice.  3-4 glucose tablets.  5-6 hard candies.  1 small box raisins.   cup regular soda pop.  Practice good hygiene, to avoid infections.  Do not smoke. SEEK MEDICAL CARE IF:   You develop abnormal vaginal discharge, with or without itching.  You become weak and tired more than expected.  You seem to sweat a lot.  You have a sudden increase in weight, 5 pounds or more in one week.  You are losing weight, 3 pounds or more in a week.  Your blood glucose level is high, and you need instructions on what to do about it. SEEK IMMEDIATE MEDICAL CARE IF:   You develop a severe headache.  You faint or pass out.  You develop nausea and vomiting.  You become disoriented or confused.  You have a convulsion.  You develop vision problems.  You develop stomach pain.  You develop vaginal bleeding.  You develop uterine contractions.  You have leaking or a gush of fluid from the vagina. AFTER YOU HAVE THE BABY:  Go to all of your follow-up appointments, and have blood tests as advised by your caregiver.  Maintain a healthy lifestyle, to prevent diabetes in the future. This includes:  Following a healthy meal plan.  Controlling your weight.  Getting enough exercise and proper rest.  Do not smoke.  Breastfeed your baby if you can. This will lower the chance of you and your baby developing diabetes later in life. For more information about diabetes, go to the American Diabetes Association at:  www.americandiabetesassociation.org. For more information about gestational diabetes, go to the American Congress of Obstetricians and Gynecologists at: www.acog.org. Document Released: 03/01/2001 Document Revised: 02/15/2012 Document Reviewed: 09/23/2009 [employer] Patient Information 2013 [employer].  

## 2012-10-31 NOTE — Progress Notes (Signed)
Pulse: 116 Has white discharge with itching.

## 2012-10-31 NOTE — Progress Notes (Signed)
Forgot her BS record, states between 80-120, FBS usually 90's. Wants to be treated for yeast with her current Sx

## 2012-11-01 ENCOUNTER — Ambulatory Visit (HOSPITAL_COMMUNITY)
Admission: RE | Admit: 2012-11-01 | Discharge: 2012-11-01 | Disposition: A | Discharge status: Home / Self Care | Payer: Medicaid Other | Source: Ambulatory Visit | Attending: Obstetrics & Gynecology | Admitting: Obstetrics & Gynecology

## 2012-11-01 VITALS — BP 124/73 | HR 102 | Wt 175.5 lb

## 2012-11-01 DIAGNOSIS — Z3689 Encounter for other specified antenatal screening: Secondary | ICD-10-CM | POA: Insufficient documentation

## 2012-11-01 DIAGNOSIS — O0990 Supervision of high risk pregnancy, unspecified, unspecified trimester: Secondary | ICD-10-CM

## 2012-11-01 DIAGNOSIS — O24919 Unspecified diabetes mellitus in pregnancy, unspecified trimester: Secondary | ICD-10-CM

## 2012-11-01 DIAGNOSIS — Z283 Underimmunization status: Secondary | ICD-10-CM

## 2012-11-03 ENCOUNTER — Emergency Department (HOSPITAL_COMMUNITY)
Admission: EM | Admit: 2012-11-03 | Discharge: 2012-11-03 | Disposition: A | Discharge status: Home / Self Care | Payer: Medicaid Other | Attending: Emergency Medicine | Admitting: Emergency Medicine

## 2012-11-03 ENCOUNTER — Encounter (HOSPITAL_COMMUNITY): Payer: Self-pay | Admitting: *Deleted

## 2012-11-03 DIAGNOSIS — J069 Acute upper respiratory infection, unspecified: Secondary | ICD-10-CM | POA: Insufficient documentation

## 2012-11-03 DIAGNOSIS — R05 Cough: Secondary | ICD-10-CM | POA: Insufficient documentation

## 2012-11-03 DIAGNOSIS — E119 Type 2 diabetes mellitus without complications: Secondary | ICD-10-CM | POA: Insufficient documentation

## 2012-11-03 DIAGNOSIS — R059 Cough, unspecified: Secondary | ICD-10-CM | POA: Insufficient documentation

## 2012-11-03 DIAGNOSIS — F17200 Nicotine dependence, unspecified, uncomplicated: Secondary | ICD-10-CM | POA: Insufficient documentation

## 2012-11-03 DIAGNOSIS — Z794 Long term (current) use of insulin: Secondary | ICD-10-CM | POA: Insufficient documentation

## 2012-11-03 DIAGNOSIS — J45909 Unspecified asthma, uncomplicated: Secondary | ICD-10-CM | POA: Insufficient documentation

## 2012-11-03 DIAGNOSIS — R1310 Dysphagia, unspecified: Secondary | ICD-10-CM | POA: Insufficient documentation

## 2012-11-03 DIAGNOSIS — Z79899 Other long term (current) drug therapy: Secondary | ICD-10-CM | POA: Insufficient documentation

## 2012-11-03 LAB — GLUCOSE, CAPILLARY

## 2012-11-03 MED ORDER — ACETAMINOPHEN 325 MG PO TABS
325.0000 mg | ORAL_TABLET | Freq: Once | ORAL | Status: AC
  Administered 2012-11-03: 325 mg via ORAL
  Filled 2012-11-03: qty 1

## 2012-11-03 NOTE — ED Notes (Signed)
Pt state that she has tonsils that swell. Pt states that this is the 4th time that her tonsils have swollen and it has been 2 days and she is having trouble swallowing due to throat soreness. Pt in no respiratory distress. Pt states that she does not receive any medications to go home and that she comes and people tell her nothing is wrong. Pt does have a swollen left tonsil upon examination.

## 2012-11-03 NOTE — ED Notes (Signed)
RR RN at bedside

## 2012-11-03 NOTE — Progress Notes (Signed)
Call to see pt with co tonsil swelling, no ob complaints, monitors applied for NST

## 2012-11-03 NOTE — ED Provider Notes (Signed)
History     CSN: [id number]  Arrival date & time 11/03/12  2046   First MD Initiated Contact with Patient 11/03/12 2056      Chief Complaint  Patient presents with  . tonsil swelling     (Consider location/radiation/quality/duration/timing/severity/associated sxs/prior treatment) HPI Comments: Patient is G2P1Ab0 [redacted] weeks pregnant with history of asthma. She presents with complaint of sore throat X 2 days. Associated symptoms include cough and congestion. Denies fever or chills. Denies NVD or abdominal pain.  The history is provided by the patient. No language interpreter was used.    Past Medical History  Diagnosis Date  . Diabetes mellitus   . Asthma     Past Surgical History  Procedure Date  . Tooth extraction     Family History  Problem Relation Age of Onset  . Diabetes Mother   . Diabetes Maternal Grandmother     History  Substance Use Topics  . Smoking status: Current Every Day Smoker -- 0.2 packs/day  . Smokeless tobacco: Not on file  . Alcohol Use: No    OB History    Grav Para Term Preterm Abortions TAB SAB Ect Mult Living   2 1 1  0 0 0 0 0 0 1      Review of Systems  Constitutional: Negative for fever and chills.  HENT: Positive for congestion, sore throat and trouble swallowing.   Respiratory: Positive for cough.   Gastrointestinal: Negative for nausea, vomiting, abdominal pain and diarrhea.    Allergies  Review of patient's allergies indicates no known allergies.  Home Medications   Current Outpatient Rx  Name  Route  Sig  Dispense  Refill  . INSULIN ASPART 100 UNIT/ML [place] SOLN   Subcutaneous   Inject 15 Units into the skin 3 (three) times daily before meals.         . INSULIN ISOPHANE HUMAN 100 UNIT/ML [place] SUSP   Subcutaneous   Inject 15 Units into the skin 2 (two) times daily. Before breakfast and bedtime         . ONDANSETRON HCL 4 MG PO TABS   Oral   Take 4 mg by mouth every 8 (eight) hours as needed. For nausea         .  PRENATAL MULTIVITAMIN CH   Oral   Take 1 tablet by mouth daily.           BP 139/73  Pulse 125  Temp 98.9 F (37.2 C) (Oral)  Resp 18  SpO2 100%  LMP 04/14/2012  Physical Exam  Nursing note and vitals reviewed. Constitutional: She appears well-developed and well-nourished.  HENT:  Head: Normocephalic and atraumatic.  Mouth/Throat: Oropharynx is clear and moist. No oropharyngeal exudate.       Mild injection of the oropharynx.  Eyes: Conjunctivae normal and EOM are normal. No scleral icterus.  Neck: Normal range of motion. Neck supple.  Cardiovascular: Normal rate, regular rhythm and normal heart sounds.   Pulmonary/Chest: Effort normal and breath sounds normal. She has no wheezes.  Abdominal: Soft. Bowel sounds are normal. There is no tenderness.       Gravid abdomen.   Lymphadenopathy:    She has no cervical adenopathy.  Neurological: She is alert.  Skin: Skin is warm and dry.    ED Course  Procedures (including critical care time)  Labs Reviewed  GLUCOSE, CAPILLARY - Abnormal; Notable for the following:    Glucose-Capillary 172 (*)     All other components within normal limits  RAPID STREP SCREEN   Results for orders placed during the hospital encounter of 11/03/12  RAPID STREP SCREEN      Component Value Range   Streptococcus, Group A Screen (Direct) NEGATIVE  NEGATIVE  GLUCOSE, CAPILLARY      Component Value Range   Glucose-Capillary 172 (*) 70 - 99 mg/dL   Comment 1 Documented in Chart     Comment 2 Notify RN      No results found.   1. URI (upper respiratory infection)       MDM  Patient presented with complaint of sore throat and URI symptoms. Fetal heart tone 129 per nursing. Evaluated by Ob/GYN nurse who states patient and baby are doing well. Rapid strep negative. Patient visit shared with Dr. Manus Gunning who recommended tylenol for supportive care. Patient tachycardic on exam but this may be her baseline when compared to other visit. Encouraged  to increase hydration and go to [facility] hospital for future ER visit. Return precautions given. No red flags for peritonsillar abscess.        Pixie Casino, PA-C 11/03/12 2326

## 2012-11-04 NOTE — ED Provider Notes (Signed)
Medical screening examination/treatment/procedure(s) were performed by non-physician practitioner and as supervising physician I was immediately available for consultation/collaboration.  Glynn Octave, MD 11/04/12 [phone]

## 2012-11-14 ENCOUNTER — Ambulatory Visit (INDEPENDENT_AMBULATORY_CARE_PROVIDER_SITE_OTHER): Payer: Self-pay | Admitting: Family Medicine

## 2012-11-14 VITALS — BP 122/75 | Temp 98.5°F | Wt 173.6 lb

## 2012-11-14 DIAGNOSIS — O24919 Unspecified diabetes mellitus in pregnancy, unspecified trimester: Secondary | ICD-10-CM

## 2012-11-14 DIAGNOSIS — Z23 Encounter for immunization: Secondary | ICD-10-CM

## 2012-11-14 LAB — POCT URINALYSIS DIP (DEVICE)
Bilirubin Urine: NEGATIVE
Glucose, UA: 100 mg/dL — AB
Ketones, ur: 15 mg/dL — AB
Leukocytes, UA: NEGATIVE
Protein, ur: 30 mg/dL — AB
Specific Gravity, Urine: >=1.03 (ref 1.005–1.030)

## 2012-11-14 MED ORDER — TETANUS-DIPHTH-ACELL PERTUSSIS 5-2.5-18.5 LF-MCG/0.5 IM SUSP
0.5000 mL | Freq: Once | INTRAMUSCULAR | Status: AC
  Administered 2012-11-14: 0.5 mL via INTRAMUSCULAR

## 2012-11-14 NOTE — Patient Instructions (Addendum)
Preterm Labor Preterm labor is when labor starts at less than 37 weeks of pregnancy. The normal length of a pregnancy is 39 to 41 weeks. CAUSES Often, there is no identifiable underlying cause as to why a woman goes into preterm labor. However, one of the most common known causes of preterm labor is infection. Infections of the uterus, cervix, vagina, amniotic sac, bladder, kidney, or even the lungs (pneumonia) can cause labor to start. Other causes of preterm labor include:  Urogenital infections, such as yeast infections and bacterial vaginosis.  Uterine abnormalities (uterine shape, uterine septum, fibroids, bleeding from the placenta).  A cervix that has been operated on and opens prematurely.  Malformations in the baby.  Multiple gestations (twins, triplets, and so on).  Breakage of the amniotic sac. Additional risk factors for preterm labor include:  Previous history of preterm labor.  Premature rupture of membranes (PROM).  A placenta that covers the opening of the cervix (placenta previa).  A placenta that separates from the uterus (placenta abruption).  A cervix that is too weak to hold the baby in the uterus (incompetence cervix).  Having too much fluid in the amniotic sac (polyhydramnios).  Taking illegal drugs or smoking while pregnant.  Not gaining enough weight while pregnant.  Women younger than 78 and older than 21 years old.  Low socioeconomic status.  African-American ethnicity. SYMPTOMS Signs and symptoms of preterm labor include:  Menstrual-like cramps.  Contractions that are 30 to 70 seconds apart, become very regular, closer together, and are more intense and painful.  Contractions that start on the top of the uterus and spread down to the lower abdomen and back.  A sense of increased pelvic pressure or back pain.  A watery or bloody discharge that comes from the vagina. DIAGNOSIS  A diagnosis can be confirmed by:  A vaginal exam.  An  ultrasound of the cervix.  Sampling (swabbing) cervico-vaginal secretions. These samples can be tested for the presence of fetal fibronectin. This is a protein found in cervical discharge which is associated with preterm labor.  Fetal monitoring. TREATMENT  Depending on the length of the pregnancy and other circumstances, a caregiver may suggest bed rest. If necessary, there are medicines that can be given to stop contractions and to quicken fetal lung maturity. If labor happens before 34 weeks of pregnancy, a prolonged hospital stay may be recommended. Treatment depends on the condition of both the mother and baby. PREVENTION There are some things a mother can do to lower the risk of preterm labor in future pregnancies. A woman can:   Stop smoking.  Maintain healthy weight gain and avoid chemicals and drugs that are not necessary.  Be watchful for any type of infection.  Inform her caregiver if she has a known history of preterm labor. Document Released: 02/13/2004 Document Revised: 02/15/2012 Document Reviewed: 03/20/2011 [facility] Patient Information 2013 [place], [place].  Breastfeeding Deciding to breastfeed is one of the best choices you can make for you and your baby. The information that follows gives a brief overview of the benefits of breastfeeding as well as common topics surrounding breastfeeding. BENEFITS OF BREASTFEEDING For the baby  The first milk (colostrum) helps the baby's digestive system function better.   There are antibodies in the mother's milk that help the baby fight off infections.   The baby has a lower incidence of asthma, allergies, and sudden infant death syndrome (SIDS).   The nutrients in breast milk are better for the baby than infant formulas,  and breast milk helps the baby's brain grow better.   Babies who breastfeed have less gas, colic, and constipation.  For the mother  Breastfeeding helps develop a very special bond between the mother  and her baby.   Breastfeeding is convenient, always available at the correct temperature, and costs nothing.   Breastfeeding burns calories in the mother and helps her lose weight that was gained during pregnancy.   Breastfeeding makes the uterus contract back down to normal size faster and slows bleeding following delivery.   Breastfeeding mothers have a lower risk of developing breast cancer.  BREASTFEEDING FREQUENCY  A healthy, full-term baby may breastfeed as often as every hour or space his or her feedings to every 3 hours.   Watch your baby for signs of hunger. Nurse your baby if he or she shows signs of hunger. How often you nurse will vary from baby to baby.   Nurse as often as the baby requests, or when you feel the need to reduce the fullness of your breasts.   Awaken the baby if it has been 3 4 hours since the last feeding.   Frequent feeding will help the mother make more milk and will help prevent problems, such as sore nipples and engorgement of the breasts.  BABY'S POSITION AT THE BREAST  Whether lying down or sitting, be sure that the baby's tummy is facing your tummy.   Support the breast with 4 fingers underneath the breast and the thumb above. Make sure your fingers are well away from the nipple and baby's mouth.   Stroke the baby's lips gently with your finger or nipple.   When the baby's mouth is open wide enough, place all of your nipple and as much of the areola as possible into your baby's mouth.   Pull the baby in close so the tip of the nose and the baby's cheeks touch the breast during the feeding.  FEEDINGS AND SUCTION  The length of each feeding varies from baby to baby and from feeding to feeding.   The baby must suck about 2 3 minutes for your milk to get to him or her. This is called a "let down." For this reason, allow the baby to feed on each breast as long as he or she wants. Your baby will end the feeding when he or she has  received the right balance of nutrients.   To break the suction, put your finger into the corner of the baby's mouth and slide it between his or her gums before removing your breast from his or her mouth. This will help prevent sore nipples.  HOW TO TELL WHETHER YOUR BABY IS GETTING ENOUGH BREAST MILK. Wondering whether or not your baby is getting enough milk is a common concern among mothers. You can be assured that your baby is getting enough milk if:   Your baby is actively sucking and you hear swallowing.   Your baby seems relaxed and satisfied after a feeding.   Your baby nurses at least 8 12 times in a 24 hour time period. Nurse your baby until he or she unlatches or falls asleep at the first breast (at least 10 20 minutes), then offer the second side.   Your baby is wetting 5 6 disposable diapers (6 8 cloth diapers) in a 24 hour period by 54 85 days of age.   Your baby is having at least 3 4 stools every 24 hours for the first 6 weeks. The stool  should be soft and yellow.   Your baby should gain 4 7 ounces per week after he or she is 29 days old.   Your breasts feel softer after nursing.  REDUCING BREAST ENGORGEMENT  In the first week after your baby is born, you may experience signs of breast engorgement. When breasts are engorged, they feel heavy, warm, full, and may be tender to the touch. You can reduce engorgement if you:   Nurse frequently, every 2 3 hours. Mothers who breastfeed early and often have fewer problems with engorgement.   Place light ice packs on your breasts for 10 20 minutes between feedings. This reduces swelling. Wrap the ice packs in a lightweight towel to protect your skin. Bags of frozen vegetables work well for this purpose.   Take a warm shower or apply warm, moist heat to your breast for 5 10 minutes just before each feeding. This increases circulation and helps the milk flow.   Gently massage your breast before and during the feeding. Using  your finger tips, massage from the chest wall towards your nipple in a circular motion.   Make sure that the baby empties at least one breast at every feeding before switching sides.   Use a breast pump to empty the breasts if your baby is sleepy or not nursing well. You may also want to pump if you are returning to work oryou feel you are getting engorged.   Avoid bottle feeds, pacifiers, or supplemental feedings of water or juice in place of breastfeeding. Breast milk is all the food your baby needs. It is not necessary for your baby to have water or formula. In fact, to help your breasts make more milk, it is best not to give your baby supplemental feedings during the early weeks.   Be sure the baby is latched on and positioned properly while breastfeeding.   Wear a supportive bra, avoiding underwire styles.   Eat a balanced diet with enough fluids.   Rest often, relax, and take your prenatal vitamins to prevent fatigue, stress, and anemia.  If you follow these suggestions, your engorgement should improve in 24 48 hours. If you are still experiencing difficulty, call your lactation consultant or caregiver.  CARING FOR YOURSELF Take care of your breasts  Bathe or shower daily.   Avoid using soap on your nipples.   Start feedings on your left breast at one feeding and on your right breast at the next feeding.   You will notice an increase in your milk supply 2 5 days after delivery. You may feel some discomfort from engorgement, which makes your breasts very firm and often tender. Engorgement "peaks" out within 24 48 hours. In the meantime, apply warm moist towels to your breasts for 5 10 minutes before feeding. Gentle massage and expression of some milk before feeding will soften your breasts, making it easier for your baby to latch on.   Wear a well-fitting nursing bra, and air dry your nipples for a 3 after each feeding.   Only use cotton bra pads.   Only  use pure lanolin on your nipples after nursing. You do not need to wash it off before feeding the baby again. Another option is to express a few drops of breast milk and gently massage it into your nipples.  Take care of yourself  Eat well-balanced meals and nutritious snacks.   Drinking milk, fruit juice, and water to satisfy your thirst (about 8 glasses a day).  Get plenty of rest.  Avoid foods that you notice affect the baby in a bad way.  SEEK MEDICAL CARE IF:   You have difficulty with breastfeeding and need help.   You have a hard, red, sore area on your breast that is accompanied by a fever.   Your baby is too sleepy to eat well or is having trouble sleeping.   Your baby is wetting less than 6 diapers a day, by 60 days of age.   Your baby's skin or white part of his or her eyes is more yellow than it was in the hospital.   You feel depressed.  Document Released: 11/23/2005 Document Revised: 05/24/2012 Document Reviewed: 02/21/2012 [facility] Patient Information 2013 [place], [place].

## 2012-11-14 NOTE — Progress Notes (Signed)
FBS 72-120, most out of range drinking juice in the night--not true fastings--change to water only after bedtime. 2 hr pp 75-165, 2 out of range No cervical change--PTL precautions.

## 2012-11-14 NOTE — Progress Notes (Signed)
P=104, c/o braxton hicks contractions about every 10-15 minutes that last a minute or two since about 25 weeks,

## 2012-11-21 ENCOUNTER — Encounter: Payer: Self-pay | Admitting: Advanced Practice Midwife

## 2012-11-28 ENCOUNTER — Encounter: Payer: Self-pay | Admitting: Obstetrics and Gynecology

## 2012-12-01 ENCOUNTER — Ambulatory Visit (HOSPITAL_COMMUNITY): Payer: Self-pay

## 2012-12-07 NOTE — L&D Delivery Note (Signed)
Delivery Note  Cindy Bruce is a 22 y.o. [id number] presenting at [redacted]w[redacted]d for IOL for preeclampsia. She was induced with pitocin and progressed normally to complete dilation. She was on Magnesium throughout labor and delivery and her BP were controlled.  At 2:29 AM a viable female was delivered via Vaginal, Spontaneous Delivery (Presentation: Right Occiput Anterior).  APGAR: 9, 9; weight pending.   Placenta status: Intact, Spontaneous.  Cord: 3 vessels with the following complications: None.  Cord pH: n/a  Anesthesia: Epidural  Episiotomy: n/a Lacerations: none Suture Repair: n/a Est. Blood Loss (mL): 700  Mom to AICU.  Baby to nursery-stable.  800mg  Cytotec given PR for continued bleeding.   MCGILL,[name] 01/06/2013, 2:52 AM  I was present for entire delivery and agree with above. Napoleon Form, MD

## 2012-12-08 ENCOUNTER — Ambulatory Visit (HOSPITAL_COMMUNITY)
Admission: RE | Admit: 2012-12-08 | Discharge: 2012-12-08 | Disposition: A | Discharge status: Home / Self Care | Payer: Medicaid Other | Source: Ambulatory Visit | Attending: Obstetrics & Gynecology | Admitting: Obstetrics & Gynecology

## 2012-12-08 VITALS — BP 116/70 | HR 103 | Wt 176.0 lb

## 2012-12-08 DIAGNOSIS — Z3689 Encounter for other specified antenatal screening: Secondary | ICD-10-CM | POA: Insufficient documentation

## 2012-12-08 DIAGNOSIS — O0990 Supervision of high risk pregnancy, unspecified, unspecified trimester: Secondary | ICD-10-CM

## 2012-12-08 DIAGNOSIS — Z283 Underimmunization status: Secondary | ICD-10-CM

## 2012-12-08 DIAGNOSIS — O24919 Unspecified diabetes mellitus in pregnancy, unspecified trimester: Secondary | ICD-10-CM | POA: Insufficient documentation

## 2012-12-08 NOTE — Progress Notes (Signed)
Cindy Bruce  was seen today for an ultrasound appointment.  See full report in [facility].  Impression: Single IUP at 33 1/7 weeks Fetal growth is appropriate (21st %tile) Long bones measuring < 5th %tile, but appear morphologically normal.  Suspect constitutional etiology. Normal amniotic fluid volume The fetus is active - BPP of 8/8 noted  Recommendations: Recommend twice weekly nonstress tests with amniotic fluid volume assessment. Follow up growth scan in 4 weeks for interval growth.  Alpha Gula, MD

## 2012-12-18 ENCOUNTER — Inpatient Hospital Stay (HOSPITAL_COMMUNITY)
Admission: AD | Admit: 2012-12-18 | Discharge: 2012-12-18 | Disposition: A | Discharge status: Home / Self Care | Payer: Medicaid Other | Source: Ambulatory Visit | Attending: Obstetrics & Gynecology | Admitting: Obstetrics & Gynecology

## 2012-12-18 ENCOUNTER — Encounter (HOSPITAL_COMMUNITY): Payer: Self-pay | Admitting: Family

## 2012-12-18 DIAGNOSIS — R509 Fever, unspecified: Secondary | ICD-10-CM

## 2012-12-18 DIAGNOSIS — O99891 Other specified diseases and conditions complicating pregnancy: Secondary | ICD-10-CM | POA: Insufficient documentation

## 2012-12-18 DIAGNOSIS — Z283 Underimmunization status: Secondary | ICD-10-CM

## 2012-12-18 DIAGNOSIS — O99810 Abnormal glucose complicating pregnancy: Secondary | ICD-10-CM

## 2012-12-18 DIAGNOSIS — R05 Cough: Secondary | ICD-10-CM | POA: Insufficient documentation

## 2012-12-18 DIAGNOSIS — O24919 Unspecified diabetes mellitus in pregnancy, unspecified trimester: Secondary | ICD-10-CM | POA: Insufficient documentation

## 2012-12-18 DIAGNOSIS — R059 Cough, unspecified: Secondary | ICD-10-CM | POA: Insufficient documentation

## 2012-12-18 DIAGNOSIS — J029 Acute pharyngitis, unspecified: Secondary | ICD-10-CM | POA: Insufficient documentation

## 2012-12-18 DIAGNOSIS — O0990 Supervision of high risk pregnancy, unspecified, unspecified trimester: Secondary | ICD-10-CM

## 2012-12-18 DIAGNOSIS — E119 Type 2 diabetes mellitus without complications: Secondary | ICD-10-CM | POA: Insufficient documentation

## 2012-12-18 LAB — GLUCOSE, CAPILLARY: Glucose-Capillary: 123 mg/dL — ABNORMAL HIGH (ref 70–99)

## 2012-12-18 MED ORDER — HYDROCOD POLST-CHLORPHEN POLST 10-8 MG/5ML PO LQCR: 5.0000 mL | Freq: Two times a day (BID) | ORAL | Status: DC | PRN

## 2012-12-18 MED ORDER — ACETAMINOPHEN 500 MG PO TABS
1000.0000 mg | ORAL_TABLET | Freq: Once | ORAL | Status: DC
  Filled 2012-12-18: qty 2

## 2012-12-18 MED ORDER — OSELTAMIVIR PHOSPHATE 75 MG PO CAPS: 75.0000 mg | ORAL_CAPSULE | Freq: Two times a day (BID) | ORAL | Status: DC

## 2012-12-18 NOTE — MAU Note (Addendum)
Pt reports she has had a fever, cold chills, cough, N/V and headache for the past 2 days. Fever highest at 101.5. Pt also c/o contractions off and on  q10 min. Pt is diabetic and seen in High risk clinic

## 2012-12-18 NOTE — MAU Note (Addendum)
Patient presents to MAU by EMS with c/o fever, cough, cold chills since yesterday afternoon. Patient reports FOB's mother has been sick with same s/s. Reports N/V; one emesis occurrence in last 24-hrs.  Patient took Catering manager Cough/Cold/Fever Liquid Gel x 1 yesterday afternoon; symptoms unrelieved. Reports good FM. Patient reports intermittent contractions since 25 weeks.  Patient is T2DM, takes Novolog and Humulin.

## 2012-12-18 NOTE — MAU Provider Note (Signed)
History     CSN: [id number]  Arrival date and time: 12/18/12 1731   None     Chief Complaint  Patient presents with  . Fever   HPI 22 y.o. G2P1001 at [redacted]w[redacted]d with fever/chills/cough/sore throat x 1 day, mother in law is sick with the same thing. + fetal movement.    Past Medical History  Diagnosis Date  . Diabetes mellitus   . Asthma     Past Surgical History  Procedure Date  . Tooth extraction     Family History  Problem Relation Age of Onset  . Diabetes Mother   . Diabetes Maternal Grandmother     History  Substance Use Topics  . Smoking status: Current Every Day Smoker -- 0.2 packs/day  . Smokeless tobacco: Not on file  . Alcohol Use: No    Allergies: No Known Allergies  Prescriptions prior to admission  Medication Sig Dispense Refill  . insulin aspart (NOVOLOG) 100 UNIT/ML injection Inject 15 Units into the skin 3 (three) times daily before meals.      . insulin NPH (HUMULIN N,NOVOLIN N) 100 UNIT/ML injection Inject 15 Units into the skin 2 (two) times daily. Before breakfast and bedtime      . Prenatal Vit-Fe Fumarate-FA (PRENATAL MULTIVITAMIN) TABS Take 1 tablet by mouth daily.        ROS Physical Exam   Blood pressure 125/72, pulse 119, temperature 99.7 F (37.6 C), temperature source Oral, resp. rate 20, height 5\' 1"  (1.549 m), weight 178 lb (80.74 kg), last menstrual period 04/14/2012, SpO2 100.00%.  Physical Exam  Nursing note and vitals reviewed. Constitutional: She is oriented to person, place, and time. She appears well-developed and well-nourished. No distress.  Cardiovascular: Normal rate, regular rhythm and normal heart sounds.   Respiratory: Effort normal and breath sounds normal. No respiratory distress. She has no wheezes.  GI: Soft. There is no tenderness.  Musculoskeletal: Normal range of motion.  Neurological: She is alert and oriented to person, place, and time.  Skin: Skin is warm and dry.  Psychiatric: She has a normal mood and  affect.   EFM reactive  MAU Course  Procedures Results for orders placed during the hospital encounter of 12/18/12 (from the past 24 hour(s))  GLUCOSE, CAPILLARY     Status: Abnormal   Collection Time   12/18/12  6:43 PM      Component Value Range   Glucose-Capillary 123 (*) 70 - 99 mg/dL   Flu test pending Assessment and Plan   1. Unspecified high-risk pregnancy   2. Diabetes mellitus, antepartum   3. Rubella non-immune status, antepartum   4. Influenza-like illness       Medication List     As of 12/18/2012  8:31 PM    START taking these medications         chlorpheniramine-HYDROcodone 10-8 MG/5ML Lqcr   Commonly known as: TUSSIONEX   Take 5 mLs by mouth every 12 (twelve) hours as needed.      oseltamivir 75 MG capsule   Commonly known as: TAMIFLU   Take 1 capsule (75 mg total) by mouth 2 (two) times daily.      CONTINUE taking these medications         insulin aspart 100 UNIT/ML injection   Commonly known as: novoLOG      insulin NPH 100 UNIT/ML injection   Commonly known as: HUMULIN N,NOVOLIN N      prenatal multivitamin Tabs  Where to get your medications    These are the prescriptions that you need to pick up. We sent them to a specific pharmacy, so you will need to go there to get them.   [id number] - [place], [place] - [address]    [address] [place] [address]    Phone: [phone]        oseltamivir 75 MG capsule         You may get these medications from any pharmacy.         chlorpheniramine-HYDROcodone 10-8 MG/5ML Lqcr            Follow-up Information    Follow up with [facility] (as scheduled)    Contact information:   [address] [place] [address] [phone]           Virgia Kelner 12/18/2012, 7:45 PM

## 2012-12-19 ENCOUNTER — Encounter: Payer: Self-pay | Admitting: Obstetrics and Gynecology

## 2012-12-19 LAB — INFLUENZA PANEL BY PCR (TYPE A & B): Influenza A By PCR: POSITIVE — AB

## 2012-12-26 ENCOUNTER — Encounter: Payer: Self-pay | Admitting: Obstetrics and Gynecology

## 2013-01-02 ENCOUNTER — Ambulatory Visit (INDEPENDENT_AMBULATORY_CARE_PROVIDER_SITE_OTHER): Payer: Medicaid Other | Admitting: Obstetrics & Gynecology

## 2013-01-02 VITALS — BP 135/89 | Temp 97.8°F | Wt 177.0 lb

## 2013-01-02 DIAGNOSIS — O24919 Unspecified diabetes mellitus in pregnancy, unspecified trimester: Secondary | ICD-10-CM

## 2013-01-02 DIAGNOSIS — O26899 Other specified pregnancy related conditions, unspecified trimester: Secondary | ICD-10-CM

## 2013-01-02 DIAGNOSIS — Z283 Underimmunization status: Secondary | ICD-10-CM

## 2013-01-02 DIAGNOSIS — N898 Other specified noninflammatory disorders of vagina: Secondary | ICD-10-CM

## 2013-01-02 DIAGNOSIS — O98519 Other viral diseases complicating pregnancy, unspecified trimester: Secondary | ICD-10-CM | POA: Insufficient documentation

## 2013-01-02 DIAGNOSIS — O9989 Other specified diseases and conditions complicating pregnancy, childbirth and the puerperium: Secondary | ICD-10-CM

## 2013-01-02 LAB — POCT URINALYSIS DIP (DEVICE)
Protein, ur: 100 mg/dL — AB
Specific Gravity, Urine: 1.025 (ref 1.005–1.030)
Urobilinogen, UA: 1 mg/dL (ref 0.0–1.0)
pH: 5.5 (ref 5.0–8.0)

## 2013-01-02 LAB — CBC
Platelets: 268 10*3/uL (ref 150–400)
RBC: 4.39 MIL/uL (ref 3.87–5.11)
RDW: 15 % (ref 11.5–15.5)
WBC: 7.7 10*3/uL (ref 4.0–10.5)

## 2013-01-02 LAB — RPR

## 2013-01-02 LAB — COMPREHENSIVE METABOLIC PANEL
ALT: <8 U/L (ref 0–35)
Albumin: 3.2 g/dL — ABNORMAL LOW (ref 3.5–5.2)
CO2: 19 mEq/L (ref 19–32)
Calcium: 8.6 mg/dL (ref 8.4–10.5)
Chloride: 105 mEq/L (ref 96–112)
Glucose, Bld: 160 mg/dL — ABNORMAL HIGH (ref 70–99)
Potassium: 3.9 mEq/L (ref 3.5–5.3)
Sodium: 135 mEq/L (ref 135–145)
Total Protein: 6.6 g/dL (ref 6.0–8.3)

## 2013-01-02 LAB — HEMOGLOBIN A1C: Mean Plasma Glucose: 163 mg/dL — ABNORMAL HIGH (ref <117)

## 2013-01-02 MED ORDER — FLUCONAZOLE 150 MG PO TABS: 150.0000 mg | ORAL_TABLET | Freq: Once | ORAL | Status: DC

## 2013-01-02 NOTE — Progress Notes (Signed)
No prenatal visit since 11/14/13. Seen in MAU on 12/18/12, diagnosed with Influenza and treated with Tamiflu.  Last ultrasound on 12/08/12 showed EFW 21%tile, next scan scheduled 01/05/13 with MFM. Has not started 2X/week testing since she missed her appointments, will start today.   NST reactive today.  Forgot log book, random CBG 159.  She reports that her fastings are less than 90, postprandials 100-130.  Emphasized importance of bringing log book, coming for prenatal appointments. Pelvic cultures done today. Complained of pruritic vaginal discharge, white discharge noted in vagina, Diflucan prescribed.  Will follow up ancillary testing. No other complaints or concerns.  Fetal movement and labor precautions reviewed.

## 2013-01-02 NOTE — Progress Notes (Signed)
Pulse 104 C/o of pelvic pain when ambulating and pain on lower back; pressure in pelvic. Vaginal d/c states as white with itch.

## 2013-01-02 NOTE — Patient Instructions (Signed)
Return to clinic for any obstetric concerns or go to MAU for evaluation  

## 2013-01-04 ENCOUNTER — Telehealth: Payer: Self-pay | Admitting: *Deleted

## 2013-01-04 ENCOUNTER — Encounter: Payer: Self-pay | Admitting: Obstetrics & Gynecology

## 2013-01-04 LAB — CULTURE, BETA STREP (GROUP B ONLY)

## 2013-01-04 NOTE — Telephone Encounter (Signed)
Cindy Bruce called and left  A message requesting someone call her back asap. Called Cindy Bruce and she states she has been having contractions off and on and pelvic pressure and pain. We discussed at her last visit she was having pelvic pain and pressure and contractions. We discussed that now she is [redacted] weeks pregnant and her body is getting ready to have the baby and that if she starts having contractions that do not go away she should start noting the time they occur and when they get to about 5 minutes apart to come to MAU at anytime- also if they get too painful , or she starts leaking fluid or bleeding, or baby is not moving normally or something feels wrong to come to MAU at any time. Also discussed if her pain is not contractions but she is too uncomfortable or in too much pain can come to MAU at any time. Cindy Bruce states she does not think she needs to come to MAU now and will wait until we see her at her next appointment tomorrow but if she feels she needs to that she will go to MAU.

## 2013-01-05 ENCOUNTER — Other Ambulatory Visit: Payer: Self-pay

## 2013-01-05 ENCOUNTER — Encounter (HOSPITAL_COMMUNITY): Payer: Self-pay | Admitting: Anesthesiology

## 2013-01-05 ENCOUNTER — Other Ambulatory Visit (HOSPITAL_COMMUNITY): Payer: Self-pay | Admitting: Maternal and Fetal Medicine

## 2013-01-05 ENCOUNTER — Inpatient Hospital Stay (HOSPITAL_COMMUNITY)
Admission: AD | Admit: 2013-01-05 | Discharge: 2013-01-08 | DRG: 774 | Disposition: A | Discharge status: Home / Self Care | Payer: Medicaid Other | Source: Ambulatory Visit | Attending: Obstetrics & Gynecology | Admitting: Obstetrics & Gynecology

## 2013-01-05 ENCOUNTER — Encounter (HOSPITAL_COMMUNITY): Payer: Self-pay | Admitting: Obstetrics and Gynecology

## 2013-01-05 ENCOUNTER — Inpatient Hospital Stay (HOSPITAL_COMMUNITY): Payer: Medicaid Other | Admitting: Anesthesiology

## 2013-01-05 ENCOUNTER — Ambulatory Visit (HOSPITAL_COMMUNITY)
Admission: RE | Admit: 2013-01-05 | Discharge: 2013-01-05 | Disposition: A | Discharge status: Home / Self Care | Payer: Medicaid Other | Source: Ambulatory Visit | Attending: Obstetrics & Gynecology | Admitting: Obstetrics & Gynecology

## 2013-01-05 VITALS — BP 138/94 | HR 97 | Wt 181.0 lb

## 2013-01-05 DIAGNOSIS — Z283 Underimmunization status: Secondary | ICD-10-CM

## 2013-01-05 DIAGNOSIS — O0990 Supervision of high risk pregnancy, unspecified, unspecified trimester: Secondary | ICD-10-CM

## 2013-01-05 DIAGNOSIS — Z3490 Encounter for supervision of normal pregnancy, unspecified, unspecified trimester: Secondary | ICD-10-CM

## 2013-01-05 DIAGNOSIS — O98519 Other viral diseases complicating pregnancy, unspecified trimester: Secondary | ICD-10-CM

## 2013-01-05 DIAGNOSIS — O24919 Unspecified diabetes mellitus in pregnancy, unspecified trimester: Secondary | ICD-10-CM | POA: Insufficient documentation

## 2013-01-05 DIAGNOSIS — O1400 Mild to moderate pre-eclampsia, unspecified trimester: Secondary | ICD-10-CM

## 2013-01-05 DIAGNOSIS — E119 Type 2 diabetes mellitus without complications: Secondary | ICD-10-CM | POA: Diagnosis present

## 2013-01-05 DIAGNOSIS — IMO0002 Reserved for concepts with insufficient information to code with codable children: Secondary | ICD-10-CM | POA: Diagnosis present

## 2013-01-05 DIAGNOSIS — O2432 Unspecified pre-existing diabetes mellitus in childbirth: Principal | ICD-10-CM | POA: Diagnosis present

## 2013-01-05 LAB — CBC
MCV: 78.7 fL (ref 78.0–100.0)
Platelets: 268 10*3/uL (ref 150–400)
RBC: 4.42 MIL/uL (ref 3.87–5.11)
RDW: 14.4 % (ref 11.5–15.5)
WBC: 5.9 10*3/uL (ref 4.0–10.5)

## 2013-01-05 LAB — GLUCOSE, CAPILLARY
Glucose-Capillary: 205 mg/dL — ABNORMAL HIGH (ref 70–99)
Glucose-Capillary: 185 mg/dL — ABNORMAL HIGH (ref 70–99)
Glucose-Capillary: 123 mg/dL — ABNORMAL HIGH (ref 70–99)
Glucose-Capillary: 95 mg/dL (ref 70–99)
Glucose-Capillary: 100 mg/dL — ABNORMAL HIGH (ref 70–99)
Glucose-Capillary: 95 mg/dL (ref 70–99)

## 2013-01-05 LAB — TYPE AND SCREEN

## 2013-01-05 LAB — COMPREHENSIVE METABOLIC PANEL
ALT: 7 U/L (ref 0–35)
AST: 10 U/L (ref 0–37)
Albumin: 2.5 g/dL — ABNORMAL LOW (ref 3.5–5.2)
CO2: 20 mEq/L (ref 19–32)
Calcium: 9 mg/dL (ref 8.4–10.5)
Chloride: 100 mEq/L (ref 96–112)
Creatinine, Ser: 0.6 mg/dL (ref 0.50–1.10)
GFR calc non Af Amer: >90 mL/min (ref >90)
Sodium: 133 mEq/L — ABNORMAL LOW (ref 135–145)
Total Bilirubin: 0.2 mg/dL — ABNORMAL LOW (ref 0.3–1.2)

## 2013-01-05 LAB — PROTEIN / CREATININE RATIO, URINE
Protein Creatinine Ratio: 0.96 — ABNORMAL HIGH (ref 0.00–0.15)
Total Protein, Urine: 48.9 mg/dL

## 2013-01-05 MED ORDER — ONDANSETRON HCL 4 MG/2ML IJ SOLN: 4.0000 mg | Freq: Four times a day (QID) | INTRAMUSCULAR | Status: DC | PRN

## 2013-01-05 MED ORDER — LACTATED RINGERS IV SOLN
500.0000 mL | Freq: Once | INTRAVENOUS | Status: AC
  Administered 2013-01-05: 500 mL via INTRAVENOUS

## 2013-01-05 MED ORDER — OXYTOCIN 40 UNITS IN LACTATED RINGERS INFUSION - SIMPLE MED
1.0000 m[IU]/min | INTRAVENOUS | Status: DC
  Administered 2013-01-05: 2 m[IU]/min via INTRAVENOUS
  Filled 2013-01-05: qty 1000

## 2013-01-05 MED ORDER — FENTANYL 2.5 MCG/ML BUPIVACAINE 1/10 % EPIDURAL INFUSION (WH - ANES)
14.0000 mL/h | INTRAMUSCULAR | Status: DC
  Administered 2013-01-06: 14 mL/h via EPIDURAL
  Filled 2013-01-05 (×2): qty 125

## 2013-01-05 MED ORDER — PHENYLEPHRINE 40 MCG/ML (10ML) SYRINGE FOR IV PUSH (FOR BLOOD PRESSURE SUPPORT)
80.0000 ug | PREFILLED_SYRINGE | INTRAVENOUS | Status: DC | PRN
  Filled 2013-01-05: qty 5

## 2013-01-05 MED ORDER — LIDOCAINE HCL (PF) 1 % IJ SOLN
30.0000 mL | INTRAMUSCULAR | Status: DC | PRN
  Filled 2013-01-05: qty 30

## 2013-01-05 MED ORDER — MAGNESIUM SULFATE 40 G IN LACTATED RINGERS - SIMPLE
2.0000 g/h | INTRAVENOUS | Status: DC
  Administered 2013-01-05 – 2013-01-06 (×2): 2 g/h via INTRAVENOUS
  Filled 2013-01-05 (×2): qty 500

## 2013-01-05 MED ORDER — SODIUM CHLORIDE 0.9 % IV SOLN
INTRAVENOUS | Status: DC
  Administered 2013-01-05: 3.5 [IU]/h via INTRAVENOUS
  Filled 2013-01-05: qty 1

## 2013-01-05 MED ORDER — LIDOCAINE HCL (PF) 1 % IJ SOLN
INTRAMUSCULAR | Status: DC | PRN
  Administered 2013-01-05 (×2): 4 mL

## 2013-01-05 MED ORDER — EPHEDRINE 5 MG/ML INJ: 10.0000 mg | INTRAVENOUS | Status: DC | PRN

## 2013-01-05 MED ORDER — CITRIC ACID-SODIUM CITRATE 334-500 MG/5ML PO SOLN: 30.0000 mL | ORAL | Status: DC | PRN

## 2013-01-05 MED ORDER — LACTATED RINGERS IV SOLN: 500.0000 mL | INTRAVENOUS | Status: DC | PRN

## 2013-01-05 MED ORDER — IBUPROFEN 600 MG PO TABS: 600.0000 mg | ORAL_TABLET | Freq: Four times a day (QID) | ORAL | Status: DC | PRN

## 2013-01-05 MED ORDER — DEXTROSE 50 % IV SOLN: 25.0000 mL | INTRAVENOUS | Status: DC | PRN

## 2013-01-05 MED ORDER — OXYTOCIN BOLUS FROM INFUSION
500.0000 mL | INTRAVENOUS | Status: DC
  Administered 2013-01-06: 500 mL via INTRAVENOUS

## 2013-01-05 MED ORDER — TERBUTALINE SULFATE 1 MG/ML IJ SOLN: 0.2500 mg | Freq: Once | INTRAMUSCULAR | Status: AC | PRN

## 2013-01-05 MED ORDER — FLEET ENEMA 7-19 GM/118ML RE ENEM: 1.0000 | ENEMA | RECTAL | Status: DC | PRN

## 2013-01-05 MED ORDER — LACTATED RINGERS IV SOLN
INTRAVENOUS | Status: DC
  Administered 2013-01-05: 100 mL/h via INTRAVENOUS
  Administered 2013-01-05: 29 mL/h via INTRAVENOUS

## 2013-01-05 MED ORDER — LIDOCAINE VISCOUS 2 % MT SOLN
20.0000 mL | OROMUCOSAL | Status: DC | PRN
  Administered 2013-01-05: 15 mL via OROMUCOSAL
  Filled 2013-01-05: qty 20

## 2013-01-05 MED ORDER — EPHEDRINE 5 MG/ML INJ
10.0000 mg | INTRAVENOUS | Status: DC | PRN
  Filled 2013-01-05: qty 4

## 2013-01-05 MED ORDER — OXYTOCIN 40 UNITS IN LACTATED RINGERS INFUSION - SIMPLE MED: 62.5000 mL/h | INTRAVENOUS | Status: DC

## 2013-01-05 MED ORDER — SODIUM CHLORIDE 0.9 % IV SOLN
INTRAVENOUS | Status: DC
  Administered 2013-01-05: 2.5 [IU]/h via INTRAVENOUS
  Filled 2013-01-05: qty 1

## 2013-01-05 MED ORDER — PHENYLEPHRINE 40 MCG/ML (10ML) SYRINGE FOR IV PUSH (FOR BLOOD PRESSURE SUPPORT): 80.0000 ug | PREFILLED_SYRINGE | INTRAVENOUS | Status: DC | PRN

## 2013-01-05 MED ORDER — SODIUM CHLORIDE 0.9 % IV SOLN
INTRAVENOUS | Status: DC
  Administered 2013-01-05: 50 mL via INTRAVENOUS

## 2013-01-05 MED ORDER — MAGNESIUM SULFATE BOLUS VIA INFUSION
4.0000 g | Freq: Once | INTRAVENOUS | Status: AC
  Administered 2013-01-05: 4 g via INTRAVENOUS
  Filled 2013-01-05: qty 500

## 2013-01-05 MED ORDER — OXYCODONE-ACETAMINOPHEN 5-325 MG PO TABS
1.0000 | ORAL_TABLET | ORAL | Status: DC | PRN
  Administered 2013-01-05: 1 via ORAL
  Filled 2013-01-05: qty 1

## 2013-01-05 MED ORDER — FENTANYL 2.5 MCG/ML BUPIVACAINE 1/10 % EPIDURAL INFUSION (WH - ANES)
INTRAMUSCULAR | Status: DC | PRN
  Administered 2013-01-05: 13 mL/h via EPIDURAL

## 2013-01-05 MED ORDER — ACETAMINOPHEN 325 MG PO TABS: 650.0000 mg | ORAL_TABLET | ORAL | Status: DC | PRN

## 2013-01-05 MED ORDER — DIPHENHYDRAMINE HCL 50 MG/ML IJ SOLN
12.5000 mg | INTRAMUSCULAR | Status: DC | PRN
Start: 2013-01-05 — End: 2013-01-06

## 2013-01-05 MED ORDER — DEXTROSE-NACL 5-0.45 % IV SOLN
INTRAVENOUS | Status: DC
  Administered 2013-01-05: 47 mL/h via INTRAVENOUS

## 2013-01-05 MED ORDER — SODIUM CHLORIDE 0.9 % IV SOLN
INTRAVENOUS | Status: DC
  Filled 2013-01-05: qty 1

## 2013-01-05 NOTE — MAU Note (Signed)
Pt sent from MFM for eval of elevated bp, was diastolic 90's this am. Denies h/a, dizziness, or blurred vision.

## 2013-01-05 NOTE — Anesthesia Procedure Notes (Signed)
Epidural Patient location during procedure: OB Start time: 01/05/2013 5:40 PM  Staffing Anesthesiologist: Jeannelle Wiens A. Performed by: anesthesiologist   Preanesthetic Checklist Completed: patient identified, site marked, surgical consent, pre-op evaluation, timeout performed, IV checked, risks and benefits discussed and monitors and equipment checked  Epidural Patient position: sitting Prep: site prepped and draped and DuraPrep Patient monitoring: continuous pulse ox and blood pressure Approach: midline Injection technique: LOR air  Needle:  Needle type: Tuohy  Needle gauge: 17 G Needle length: 9 cm and 9 Needle insertion depth: 5 cm cm Catheter type: closed end flexible Catheter size: 19 Gauge Catheter at skin depth: 10 cm Test dose: negative and Other  Assessment Events: blood not aspirated, injection not painful, no injection resistance, negative IV test and no paresthesia  Additional Notes Patient identified. Risks and benefits discussed including failed block, incomplete  Pain control, post dural puncture headache, nerve damage, paralysis, blood pressure Changes, nausea, vomiting, reactions to medications-both toxic and allergic and post Partum back pain. All questions were answered. Patient expressed understanding and wished to proceed. Sterile technique was used throughout procedure. Epidural site was Dressed with sterile barrier dressing. No paresthesias, signs of intravascular injection Or signs of intrathecal spread were encountered. Difficult due to poor positioning. Attempt x 4. Patient was more comfortable after the epidural was dosed. Please see RN's note for documentation of vital signs and FHR which are stable.

## 2013-01-05 NOTE — Anesthesia Preprocedure Evaluation (Signed)
Anesthesia Evaluation  Patient identified by MRN, date of birth, ID band Patient awake    Reviewed: Allergy & Precautions, H&P , Patient's Chart, lab work & pertinent test results  Airway Mallampati: III TM Distance: >3 FB Neck ROM: full    Dental No notable dental hx. (+) Teeth Intact   Pulmonary neg pulmonary ROS, asthma ,  breath sounds clear to auscultation  Pulmonary exam normal       Cardiovascular negative cardio ROS  Rhythm:regular Rate:Normal     Neuro/Psych negative neurological ROS  negative psych ROS   GI/Hepatic negative GI ROS, Neg liver ROS,   Endo/Other  diabetes, Well Controlled, Type 1, Insulin DependentObesity  Renal/GU negative Renal ROS  negative genitourinary   Musculoskeletal   Abdominal Normal abdominal exam  (+)   Peds  Hematology negative hematology ROS (+)   Anesthesia Other Findings   Reproductive/Obstetrics (+) Pregnancy                           Anesthesia Physical Anesthesia Plan  ASA: II  Anesthesia Plan: Epidural   Post-op Pain Management:    Induction:   Airway Management Planned:   Additional Equipment:   Intra-op Plan:   Post-operative Plan:   Informed Consent: I have reviewed the patients History and Physical, chart, labs and discussed the procedure including the risks, benefits and alternatives for the proposed anesthesia with the patient or authorized representative who has indicated his/her understanding and acceptance.     Plan Discussed with: Anesthesiologist  Anesthesia Plan Comments:         Anesthesia Quick Evaluation

## 2013-01-05 NOTE — Progress Notes (Signed)
Cindy Bruce  was seen today for an ultrasound appointment.  See full report in [facility].  Comments: Cindy Bruce returns for follow up due to pregestational diabetes on split dose insulin.  She has been poorly compliant with her prenatal visits and does not yet have a scheduled induction date.  BPs on arrival to clinic were 142/95 and 138/94.  Impression: Single IUP at 37 1/7 weeks Fetal growth is appropriate (34th %tile) Long bones measuring < 5th %tile, but appear morphologically normal.  Suspect constitutional etiology. Normal amniotic fluid volume The fetus is active - BPP of 8/8 noted  Recommendations: Recommend twice weekly nonstress tests with amniotic fluid volume assessment. Follow-up ultrasounds as clinically indicated.  Induction of labor at 39 weeks or earlier if clinically indicated. Patient was transferred to MAU for further evalaution of elevated blood pressures.  Alpha Gula, MD

## 2013-01-05 NOTE — Progress Notes (Signed)
Cindy Bruce is a 22 y.o. G2P1001 at [redacted]w[redacted]d admitted for induction of labor due to Pre-eclamptic toxemia of pregnancy..  Subjective: Feeling contractions but not uncomfortable  CBGs improved- now 100, BP stable  Objective: BP 122/79  Pulse 80  Temp 98.2 F (36.8 C) (Axillary)  Resp 20  Ht 5\' 1"  (1.549 m)  Wt 81.647 kg (180 lb)  BMI 34.01 kg/m2  SpO2 100%  LMP 04/14/2012 I/O last 3 completed shifts: In: 2495 [P.O.:720; I.V.:1775] Out: 650 [Urine:650] Total I/O In: 227.8 [P.O.:100; I.V.:127.8] Out: 50 [Urine:50]  FHT:  FHR: 130 bpm, variability: moderate,  accelerations:  Present,  decelerations:  Absent UC:   regular, every 2-4 minutes SVE:   Dilation: 4 Effacement (%): 60 Station: -1 Exam by:: S Grindstaff RN  Labs: Lab Results  Component Value Date   WBC 5.9 01/05/2013   HGB 11.7* 01/05/2013   HCT 34.8* 01/05/2013   MCV 78.7 01/05/2013   PLT 268 01/05/2013    Assessment / Plan: Induction of labor due to preeclampsia,  progressing well on pitocin  Labor: IUPC placed, continue increasing pitocin, continue tight CBG control Preeclampsia:  on magnesium sulfate, no signs or symptoms of toxicity and intake and ouput balanced Fetal Wellbeing:  Category I Pain Control:  Epidural I/D:  n/a Anticipated MOD:  NSVD  Cindy Bruce 01/05/2013, 8:58 PM

## 2013-01-05 NOTE — MAU Note (Signed)
"  I was seen in MFM for an U/S.  While I was there, my BP was high.  I have not had any high BP during this pregnancy.  I was brought from there to have my BP checked.  (+)FM."

## 2013-01-05 NOTE — Progress Notes (Signed)
Cindy Bruce is a 22 y.o. G2P1001 at [redacted]w[redacted]d admitted for induction of labor due to pre-eclampsia, also with insulin-dependent DM (diet controlled out of pregnancy).  Subjective: Feeling more pressure  Objective: BP 130/83  Pulse 94  Temp 98.2 F (36.8 C) (Axillary)  Resp 18  Ht 5\' 1"  (1.549 m)  Wt 81.647 kg (180 lb)  BMI 34.01 kg/m2  SpO2 100%  LMP 04/14/2012 I/O last 3 completed shifts: In: 2495 [P.O.:720; I.V.:1775] Out: 650 [Urine:650] Total I/O In: 560.3 [P.O.:250; I.V.:310.3] Out: 320 [Urine:320]  FHT:  FHR: 135 bpm, variability: moderate,  accelerations:  Present,  decelerations:  Absent UC:   regular, every 2-3 minutes, adequate MVUs SVE:   Dilation: 4 Effacement (%): 90 Station: -1 Exam by:: Ranae Casebier, MD  Labs: Lab Results  Component Value Date   WBC 5.9 01/05/2013   HGB 11.7* 01/05/2013   HCT 34.8* 01/05/2013   MCV 78.7 01/05/2013   PLT 268 01/05/2013    Assessment / Plan: Induction of labor due to preeclampsia,  progressing well on pitocin  Labor: continue increasing pitocin Preeclampsia:  on magnesium sulfate, no signs or symptoms of toxicity and intake and ouput balanced Fetal Wellbeing:  Category I Pain Control:  Epidural I/D:  n/a Anticipated MOD:  NSVD  Cindy Bruce 01/05/2013, 10:42 PM

## 2013-01-05 NOTE — Progress Notes (Signed)
S. Comfortable  O. VSS, AF  Sugar 119 Mag at 2 grams/hour Pit at 14 CVX- 4/85/-1 AROM clear  A/P. IOL for pre eclampsia- Expectant management

## 2013-01-05 NOTE — H&P (Signed)
Cindy Bruce is a 22 y.o. G2P1001 at [redacted]w[redacted]d who had pregestational diabetes who presents from MFM due to elevated blood pressure. In the MAU she was found to be preeclamptic with an elevated protein/creatinine ratio. At this time she is not symptomatic of preeclampsia, and has not noted any bleeding, loss of fluid, and continues to feel good fetal movement. She is currently feeling intermittent contractions. Her primary care has been through [facility] due to Class B DM. She was diet controlled before pregnancy (early HgbA1C 8.8), and has been on novolog & novolin since 12 weeks. She has missed several prenatal appointments and did not bring her blood sugar log to the last appoinment. Her A1C 3 days ago was 7.3.   Maternal Medical History:  Reason for admission: Reason for Admission:   nauseaContractions: Onset was yesterday.   Frequency: irregular.   Duration is approximately 1 minute.   Perceived severity is moderate.    Fetal activity: Perceived fetal activity is normal.   Last perceived fetal movement was within the past hour.    Prenatal complications: Hypertension and pre-eclampsia.   Prenatal Complications - Diabetes: type 2. Diabetes is managed by insulin injections.      OB History    Grav Para Term Preterm Abortions TAB SAB Ect Mult Living   2 1 1  0 0 0 0 0 0 1     Past Medical History  Diagnosis Date  . Asthma   . Diabetes mellitus     Type 2 since 1st pg in 2008 - on insulin   Past Surgical History  Procedure Date  . Tooth extraction    Family History: family history includes Diabetes in her maternal grandmother and mother. Social History:  reports that she has quit smoking. Her smoking use included Cigarettes. She smoked .25 packs per day. She quit smokeless tobacco use about 4 months ago. She reports that she does not drink alcohol or use illicit drugs.   Prenatal Transfer Tool  Maternal Diabetes: Yes:  Diabetes Type:  Pre-pregnancy, Insulin/Medication controlled Genetic  Screening: Normal Maternal Ultrasounds/Referrals: Normal Fetal Ultrasounds or other Referrals:  Referred to Materal Fetal Medicine  Maternal Substance Abuse:  No Significant Maternal Medications:  Meds include: Other: insulin Significant Maternal Lab Results:  Lab values include: Group B Strep negative Other Comments:  None EFW: 2707 g:  34th percentile  Review of Systems  Constitutional: Negative for fever.  HENT:       Left molar pain  Eyes: Negative for blurred vision and double vision.  Respiratory: Negative for cough and wheezing.   Cardiovascular: Negative for chest pain and leg swelling.  Gastrointestinal: Negative for nausea, vomiting, diarrhea and constipation. Abdominal pain: epigastric abdominal pain.  Genitourinary: Negative for dysuria and hematuria.       Recently diagnosed with bacterial vaginosis, has not filled prescription  Neurological: Negative for dizziness, tingling, tremors, seizures and headaches.      Blood pressure 129/92, pulse 112, temperature 98 F (36.7 C), temperature source Oral, resp. rate 20, height 5\' 1"  (1.549 m), weight 81.647 kg (180 lb), last menstrual period 04/14/2012. Maternal Exam:  Uterine Assessment: Contraction strength is moderate.  Contraction duration is 50 seconds. Contraction frequency is irregular.   Abdomen: Fetal presentation: vertex  Introitus: Normal vulva. Normal vagina.  Pelvis: adequate for delivery.   Cervix: Cervix evaluated by digital exam.     Fetal Exam Fetal Monitor Review: Baseline rate: 130.  Variability: moderate (6-25 bpm).   Pattern: accelerations present and no decelerations.  Fetal State Assessment: Category I - tracings are normal.     Physical Exam  Constitutional: She is oriented to person, place, and time. She appears well-developed and well-nourished. No distress.  HENT:  Head: Normocephalic and atraumatic.  Eyes: Conjunctivae normal are normal. Pupils are equal, round, and reactive to  light.  Neck: Normal range of motion. Neck supple.  Cardiovascular: Normal rate, regular rhythm, normal heart sounds and intact distal pulses.   Respiratory: Effort normal and breath sounds normal. She has no wheezes. She has no rales.  GI: There is no tenderness.       gravid  Genitourinary: Vagina normal and uterus normal.  Musculoskeletal: Normal range of motion. She exhibits no edema and no tenderness.  Neurological: She is alert and oriented to person, place, and time. She has normal reflexes. No cranial nerve deficit.  Skin: Skin is warm and dry.  Psychiatric: She has a normal mood and affect. Her behavior is normal. Thought content normal.    Dilation: 2.5 Effacement (%): 60 Station: -2 Presentation: Vertex Exam by:: S Aurélie.Currier RN   Prenatal labs: ABO, Rh:  O Neg Antibody:  neg Rubella:  unknown RPR: NON REAC (01/27 1017)  HBsAg: Negative (07/24 0000)  HIV: NON REACTIVE (01/27 1017)  GBS:   Negative  Assessment/Plan: 22 y.o. G2P1001 at [redacted]w[redacted]d presenting with preeclampsia Admit to labor and delivery GBS negative  Preeclampsia: begin magnesium Will induce labor due to preeclampsia. Bishop Score of 6, so will begin pitocin. Class B DM, poorly controlled: will start on insulin infusion, Glucose stabilizer orders Continual monitoring with normal FHR tracing Anticipate SVD Will obtain rubella titer Reported Rh negative, but cannot find lab result. Will obtain ABO Rh.  CONROY, LOUISA 01/05/2013, 12:20 PM  I saw and examined patient and agree with above resident note. Will start magnesium, pitocin.  Napoleon Form, MD

## 2013-01-05 NOTE — MAU Provider Note (Signed)
History     CSN: [id number]  Arrival date and time: 01/05/13 0917   None     Chief Complaint  Patient presents with  . PIH eval    HPI 22 y.o. G2P1001 @ [redacted]w[redacted]d presents from MFM clinic with elevated blood pressures.  This is her second pregnancy and PT states she did not have elevated blood pressures in her first.  She also has DMT2 which is diet controlled before pregnancy and is now controlled with insulin.  Denies headache, RUQ pain, blurred vision, vaginal leakage or spotting.  She does have some epigastric, right and left middle quadrant abdominal pain.  She is positive for BV on a prior check. She states that she does have a prescription for it but has not gotten in filled. Normal DTR and no clonus noted. OB History    Grav Para Term Preterm Abortions TAB SAB Ect Mult Living   2 1 1  0 0 0 0 0 0 1      Past Medical History  Diagnosis Date  . Asthma   . Diabetes mellitus     Type 2 since 1st pg in 2008 - on insulin    Past Surgical History  Procedure Date  . Tooth extraction     Family History  Problem Relation Age of Onset  . Diabetes Mother   . Diabetes Maternal Grandmother     History  Substance Use Topics  . Smoking status: Former Smoker -- 0.2 packs/day    Types: Cigarettes  . Smokeless tobacco: Former Neurosurgeon    Quit date: 09/05/2012  . Alcohol Use: No    Allergies: No Known Allergies  Prescriptions prior to admission  Medication Sig Dispense Refill  . insulin aspart (NOVOLOG) 100 UNIT/ML injection Inject 15 Units into the skin 3 (three) times daily before meals.      . insulin NPH (HUMULIN N,NOVOLIN N) 100 UNIT/ML injection Inject 15 Units into the skin 2 (two) times daily. Before breakfast and bedtime      . oseltamivir (TAMIFLU) 75 MG capsule Take 1 capsule (75 mg total) by mouth 2 (two) times daily.  10 capsule  0  . Prenatal Vit-Fe Fumarate-FA (PRENATAL MULTIVITAMIN) TABS Take 1 tablet by mouth daily.        Review of Systems  Constitutional:  Negative.  Negative for fever and chills.  HENT: Negative.  Negative for tinnitus.   Eyes: Negative.  Negative for blurred vision.  Respiratory: Negative.   Cardiovascular: Positive for leg swelling (bilateral lower extremity).  Gastrointestinal: Positive for abdominal pain. Negative for heartburn, nausea, vomiting, diarrhea and constipation.  Genitourinary: Negative for dysuria, urgency and frequency.  Musculoskeletal: Negative.   Skin: Negative.   Neurological: Negative.  Negative for dizziness, tingling and headaches.  Psychiatric/Behavioral: Negative.    Physical Exam   Height 5\' 1"  (1.549 m), weight 180 lb 6 oz (81.818 kg), last menstrual period 04/14/2012.  Physical Exam  Constitutional: She is oriented to person, place, and time. She appears well-developed and well-nourished. No distress.  Eyes: Conjunctivae normal and EOM are normal. Pupils are equal, round, and reactive to light.  Neck: Normal range of motion. Neck supple.  Cardiovascular: Normal rate, regular rhythm, normal heart sounds and intact distal pulses.   No murmur heard. Respiratory: Effort normal and breath sounds normal.  GI: Soft. Bowel sounds are normal. There is no tenderness. There is no rebound and no guarding.  Musculoskeletal: Normal range of motion.  Neurological: She is alert and oriented to person, place,  and time. She has normal reflexes.  Skin: Skin is warm and dry.  Psychiatric: She has a normal mood and affect.   Results for orders placed during the hospital encounter of 01/05/13 (from the past 24 hour(s))  CBC     Status: Abnormal   Collection Time   01/05/13  9:30 AM      Component Value Range   WBC 5.9  4.0 - 10.5 K/uL   RBC 4.42  3.87 - 5.11 MIL/uL   Hemoglobin 11.7 (*) 12.0 - 15.0 g/dL   HCT 44.0 (*) 10.2 - 72.5 %   MCV 78.7  78.0 - 100.0 fL   MCH 26.5  26.0 - 34.0 pg   MCHC 33.6  30.0 - 36.0 g/dL   RDW 36.6  44.0 - 34.7 %   Platelets 268  150 - 400 K/uL  COMPREHENSIVE METABOLIC PANEL      Status: Abnormal   Collection Time   01/05/13  9:30 AM      Component Value Range   Sodium 133 (*) 135 - 145 mEq/L   Potassium 3.8  3.5 - 5.1 mEq/L   Chloride 100  96 - 112 mEq/L   CO2 20  19 - 32 mEq/L   Glucose, Bld 144 (*) 70 - 99 mg/dL   BUN 7  6 - 23 mg/dL   Creatinine, Ser 4.25  0.50 - 1.10 mg/dL   Calcium 9.0  8.4 - 95.6 mg/dL   Total Protein 7.0  6.0 - 8.3 g/dL   Albumin 2.5 (*) 3.5 - 5.2 g/dL   AST 10  0 - 37 U/L   ALT 7  0 - 35 U/L   Alkaline Phosphatase 125 (*) 39 - 117 U/L   Total Bilirubin 0.2 (*) 0.3 - 1.2 mg/dL   GFR calc non Af Amer >90  >90 mL/min   GFR calc Af Amer >90  >90 mL/min  PROTEIN / CREATININE RATIO, URINE     Status: Abnormal   Collection Time   01/05/13  9:46 AM      Component Value Range   Creatinine, Urine 50.98     Total Protein, Urine 48.9     PROTEIN CREATININE RATIO 0.96 (*) 0.00 - 0.15    MAU Course  Procedures CBC, CMP, P/C, external fetal monitor, monitor B/P    Assessment and Plan  A:preeclampsia  P: Admit to L&D  Kyshaun Barnette 01/05/2013, 10:06 AM

## 2013-01-05 NOTE — Progress Notes (Signed)
Dr Thad Ranger in room, talked with pt , reviewed orders

## 2013-01-05 NOTE — MAU Provider Note (Signed)
I saw and examined the patient and reviewed her labs, imaging, prenatal records, vitals and NST. I agree with above student note. Patient admitted for induction of labor for preeclampsia. Please see admission H&P. Napoleon Form, MD

## 2013-01-05 NOTE — ED Notes (Signed)
Pt taken to MAU for BP checks.

## 2013-01-06 ENCOUNTER — Encounter (HOSPITAL_COMMUNITY): Payer: Self-pay | Admitting: *Deleted

## 2013-01-06 DIAGNOSIS — O2432 Unspecified pre-existing diabetes mellitus in childbirth: Secondary | ICD-10-CM

## 2013-01-06 DIAGNOSIS — E119 Type 2 diabetes mellitus without complications: Secondary | ICD-10-CM

## 2013-01-06 DIAGNOSIS — IMO0002 Reserved for concepts with insufficient information to code with codable children: Secondary | ICD-10-CM

## 2013-01-06 LAB — CBC
Platelets: 260 10*3/uL (ref 150–400)
RDW: 14.6 % (ref 11.5–15.5)
WBC: 13.2 10*3/uL — ABNORMAL HIGH (ref 4.0–10.5)

## 2013-01-06 LAB — GLUCOSE, CAPILLARY
Glucose-Capillary: 113 mg/dL — ABNORMAL HIGH (ref 70–99)
Glucose-Capillary: 214 mg/dL — ABNORMAL HIGH (ref 70–99)
Glucose-Capillary: 178 mg/dL — ABNORMAL HIGH (ref 70–99)
Glucose-Capillary: 180 mg/dL — ABNORMAL HIGH (ref 70–99)

## 2013-01-06 LAB — RUBELLA SCREEN: Rubella: 0.89 Index (ref <0.90)

## 2013-01-06 MED ORDER — WITCH HAZEL-GLYCERIN EX PADS: 1.0000 "application " | MEDICATED_PAD | CUTANEOUS | Status: DC | PRN

## 2013-01-06 MED ORDER — LANOLIN HYDROUS EX OINT: TOPICAL_OINTMENT | CUTANEOUS | Status: DC | PRN

## 2013-01-06 MED ORDER — SENNOSIDES-DOCUSATE SODIUM 8.6-50 MG PO TABS
2.0000 | ORAL_TABLET | Freq: Every day | ORAL | Status: DC
  Administered 2013-01-06 – 2013-01-07 (×2): 2 via ORAL

## 2013-01-06 MED ORDER — OXYTOCIN 40 UNITS IN LACTATED RINGERS INFUSION - SIMPLE MED: 1.0000 m[IU]/min | INTRAVENOUS | Status: DC

## 2013-01-06 MED ORDER — ONDANSETRON HCL 4 MG/2ML IJ SOLN: 4.0000 mg | INTRAMUSCULAR | Status: DC | PRN

## 2013-01-06 MED ORDER — LACTATED RINGERS IV SOLN
INTRAVENOUS | Status: DC
  Administered 2013-01-06 (×3): via INTRAVENOUS

## 2013-01-06 MED ORDER — TETANUS-DIPHTH-ACELL PERTUSSIS 5-2.5-18.5 LF-MCG/0.5 IM SUSP
0.5000 mL | Freq: Once | INTRAMUSCULAR | Status: DC
  Filled 2013-01-06: qty 0.5

## 2013-01-06 MED ORDER — DIBUCAINE 1 % RE OINT
1.0000 "application " | TOPICAL_OINTMENT | RECTAL | Status: DC | PRN
  Filled 2013-01-06: qty 28

## 2013-01-06 MED ORDER — INSULIN ASPART 100 UNIT/ML ~~LOC~~ SOLN
4.0000 [IU] | Freq: Three times a day (TID) | SUBCUTANEOUS | Status: DC
  Administered 2013-01-06 (×3): 4 [IU] via SUBCUTANEOUS

## 2013-01-06 MED ORDER — MAGNESIUM SULFATE 40 G IN LACTATED RINGERS - SIMPLE
2.0000 g/h | INTRAVENOUS | Status: DC
  Filled 2013-01-06: qty 500

## 2013-01-06 MED ORDER — DIPHENHYDRAMINE HCL 25 MG PO CAPS: 25.0000 mg | ORAL_CAPSULE | Freq: Four times a day (QID) | ORAL | Status: DC | PRN

## 2013-01-06 MED ORDER — MISOPROSTOL 200 MCG PO TABS
ORAL_TABLET | ORAL | Status: AC
  Administered 2013-01-06: 800 ug via RECTAL
  Filled 2013-01-06: qty 4

## 2013-01-06 MED ORDER — ZOLPIDEM TARTRATE 5 MG PO TABS: 5.0000 mg | ORAL_TABLET | Freq: Every evening | ORAL | Status: DC | PRN

## 2013-01-06 MED ORDER — ONDANSETRON HCL 4 MG PO TABS: 4.0000 mg | ORAL_TABLET | ORAL | Status: DC | PRN

## 2013-01-06 MED ORDER — INSULIN ASPART 100 UNIT/ML ~~LOC~~ SOLN
0.0000 [IU] | Freq: Three times a day (TID) | SUBCUTANEOUS | Status: DC
  Administered 2013-01-06: 2 [IU] via SUBCUTANEOUS
  Administered 2013-01-06: 1 [IU] via SUBCUTANEOUS
  Administered 2013-01-06: 3 [IU] via SUBCUTANEOUS

## 2013-01-06 MED ORDER — SIMETHICONE 80 MG PO CHEW: 80.0000 mg | CHEWABLE_TABLET | ORAL | Status: DC | PRN

## 2013-01-06 MED ORDER — PRENATAL MULTIVITAMIN CH
1.0000 | ORAL_TABLET | Freq: Every day | ORAL | Status: DC
  Administered 2013-01-06 – 2013-01-08 (×3): 1 via ORAL
  Filled 2013-01-06 (×3): qty 1

## 2013-01-06 MED ORDER — PNEUMOCOCCAL VAC POLYVALENT 25 MCG/0.5ML IJ INJ
0.5000 mL | INJECTION | INTRAMUSCULAR | Status: DC
  Filled 2013-01-06: qty 0.5

## 2013-01-06 MED ORDER — MISOPROSTOL 200 MCG PO TABS
ORAL_TABLET | ORAL | Status: AC
  Filled 2013-01-06: qty 1

## 2013-01-06 MED ORDER — BENZOCAINE-MENTHOL 20-0.5 % EX AERO
1.0000 "application " | INHALATION_SPRAY | CUTANEOUS | Status: DC | PRN
  Filled 2013-01-06: qty 56

## 2013-01-06 MED ORDER — OXYCODONE-ACETAMINOPHEN 5-325 MG PO TABS: 1.0000 | ORAL_TABLET | ORAL | Status: DC | PRN

## 2013-01-06 MED ORDER — IBUPROFEN 600 MG PO TABS
600.0000 mg | ORAL_TABLET | Freq: Four times a day (QID) | ORAL | Status: DC
  Administered 2013-01-06 – 2013-01-08 (×9): 600 mg via ORAL
  Filled 2013-01-06 (×9): qty 1

## 2013-01-06 MED ORDER — RHO D IMMUNE GLOBULIN 1500 UNIT/2ML IJ SOLN
300.0000 ug | Freq: Once | INTRAMUSCULAR | Status: AC
  Administered 2013-01-06: 300 ug via INTRAMUSCULAR
  Filled 2013-01-06: qty 2

## 2013-01-06 NOTE — Progress Notes (Signed)
Cindy Bruce is a 22 y.o. G2P1001 at [redacted]w[redacted]d admitted for induction of labor due to Pre-eclamptic toxemia of pregnancy. Also with insulin-dependent GDM.  Subjective: Feeling more pressure, more uncomfortable   Objective: BP 136/88  Pulse 99  Temp 98.2 F (36.8 C) (Axillary)  Resp 18  Ht 5\' 1"  (1.549 m)  Wt 81.647 kg (180 lb)  BMI 34.01 kg/m2  SpO2 100%  LMP 04/14/2012 I/O last 3 completed shifts: In: 2495 [P.O.:720; I.V.:1775] Out: 650 [Urine:650] Total I/O In: 930.7 [P.O.:300; I.V.:630.7] Out: 420 [Urine:420]  FHT:  FHR: 130 bpm, variability: moderate,  accelerations:  Present,  decelerations:  Present variables UC:   regular, every 2-3 minutes, adequate MVUs SVE:   Dilation: 6 Effacement (%): 90 Station: 0 Exam by:: GPayne, RN  Labs: Lab Results  Component Value Date   WBC 5.9 01/05/2013   HGB 11.7* 01/05/2013   HCT 34.8* 01/05/2013   MCV 78.7 01/05/2013   PLT 268 01/05/2013    Assessment / Plan: Induction of labor due to preeclampsia,  progressing well on pitocin  Labor: Continue pitocin, making cervical change and MVUs adequate Preeclampsia:  on magnesium sulfate, no signs or symptoms of toxicity and intake and ouput balanced Fetal Wellbeing:  Category II Pain Control:  Epidural I/D:  n/a Anticipated MOD:  NSVD  Cindy Bruce 01/06/2013, 1:03 AM

## 2013-01-06 NOTE — Progress Notes (Signed)
UR chart review completed.  

## 2013-01-07 LAB — RH IG WORKUP (INCLUDES ABO/RH)
ABO/RH(D): O NEG
Fetal Screen: NEGATIVE
Gestational Age(Wks): 37
Unit division: 0

## 2013-01-07 LAB — GLUCOSE, CAPILLARY
Glucose-Capillary: 107 mg/dL — ABNORMAL HIGH (ref 70–99)
Glucose-Capillary: 92 mg/dL (ref 70–99)
Glucose-Capillary: 133 mg/dL — ABNORMAL HIGH (ref 70–99)
Glucose-Capillary: 97 mg/dL (ref 70–99)

## 2013-01-07 MED ORDER — METFORMIN HCL 500 MG PO TABS
1000.0000 mg | ORAL_TABLET | Freq: Two times a day (BID) | ORAL | Status: DC
  Administered 2013-01-07 – 2013-01-08 (×3): 1000 mg via ORAL
  Filled 2013-01-07 (×7): qty 2

## 2013-01-07 NOTE — Anesthesia Postprocedure Evaluation (Signed)
  Anesthesia Post-op Note  Patient: Cindy Bruce  Procedure(s) Performed: * No procedures listed *  Patient Location: ICU  Anesthesia Type:Epidural  Level of Consciousness: awake, alert  and oriented  Airway and Oxygen Therapy: Patient Spontanous Breathing  Post-op Pain: none  Post-op Assessment: Post-op Vital signs reviewed, Patient's Cardiovascular Status Stable, No headache, No backache, No residual numbness and No residual motor weakness  Post-op Vital Signs: Reviewed and stable  Complications: No apparent anesthesia complications

## 2013-01-07 NOTE — Progress Notes (Signed)
Post Partum Day 1 Subjective: no complaints She denies chest pain, SOB, llightheadedness/dizziness, heachaes, visual disturbances, RUQ/epigasric pain  Objective: Blood pressure 123/72, pulse 94, temperature 97.9 F (36.6 C), temperature source Oral, resp. rate 18, height 5\' 1"  (1.549 m), weight 178 lb 3.2 oz (80.831 kg), last menstrual period 04/14/2012, SpO2 100.00%, unknown if currently breastfeeding.  Physical Exam:  General: alert, cooperative and no distress Lochia: appropriate Uterine Fundus: firm, NT Incision: n/a DVT Evaluation: No cords or calf tenderness. No significant calf/ankle edema.   Basename 01/06/13 0405 01/05/13 0930  HGB 10.4* 11.7*  HCT 31.3* 34.8*    Assessment/Plan: Patient PPD#1 s/p SVD and magnesium sulfate secondary to pre-eclampsia - Patient hemodynamically stable - Patient denies ever being treated for DM prior to pregnancy. Will switch to metformin to increase compliance and follow-up outpatient - Continue current postpartum care Breastfeeding and Contraception Mirena   LOS: 2 days   Cindy Bruce 01/07/2013, 7:08 AM

## 2013-01-08 MED ORDER — IBUPROFEN 600 MG PO TABS: 600.0000 mg | ORAL_TABLET | Freq: Four times a day (QID) | ORAL | Status: DC

## 2013-01-08 MED ORDER — METFORMIN HCL 1000 MG PO TABS: 1000.0000 mg | ORAL_TABLET | Freq: Two times a day (BID) | ORAL | Status: DC

## 2013-01-08 NOTE — Discharge Summary (Signed)
Obstetric Discharge Summary Reason for Admission: Pt had pregestational diabetes and presented from MFM due to elevated blood pressure. In the MAU she was found to be preeclamptic with an elevated protein/creatinine ratio. At this time she was not symptomatic of preeclampsia, and had not noted any bleeding, loss of fluid, and continued to feel good fetal movement.  She was diet controlled before pregnancy (early HgbA1C 8.8), and had been on novolog & novolin since 12 weeks. She has missed several prenatal appointments and did not bring her blood sugar log to the last appoinment. Her A1C 3 days ago was 7.3.   Prenatal Procedures: NST and ultrasound Intrapartum Procedures: spontaneous vaginal delivery Postpartum Procedures: none Complications-Operative and Postpartum: none Hemoglobin  Date Value Range Status  01/06/2013 10.4* 12.0 - 15.0 g/dL Final     HCT  Date Value Range Status  01/06/2013 31.3* 36.0 - 46.0 % Final    Physical Exam:  Filed Vitals:   01/08/13 0609  BP: 110/70  Pulse: 105  Temp: 98.3 F (36.8 C)  Resp: 16   CBG (last 3)   Basename 01/08/13 0807 01/07/13 2208 01/07/13 1723  GLUCAP 119* 97 133*    General: alert, cooperative and appears stated age Lochia: appropriate Uterine Fundus: firm Incision: n/a DVT Evaluation: No evidence of DVT seen on physical exam. Negative Homan's sign.   Discharge Diagnoses: Term Pregnancy-delivered and Class B Diabetic  Discharge Information: Date: 01/08/2013 Activity: pelvic rest Diet: routine Medications: Ibuprofen and Metformin Condition: stable Instructions: refer to practice specific booklet Discharge to: home Follow-up Information    Follow up with [facility]. In 4 weeks.   Contact information:   [address] [place] [address]          Newborn Data: Live born female  Birth Weight: 5 lb 7.6 oz (2483 g) APGAR: 9, 9  Home with mother.  [facility] 01/08/2013, 7:56 AM

## 2013-01-09 ENCOUNTER — Other Ambulatory Visit: Payer: Self-pay

## 2013-02-08 ENCOUNTER — Ambulatory Visit: Payer: Self-pay | Admitting: Obstetrics and Gynecology

## 2013-02-23 ENCOUNTER — Ambulatory Visit: Payer: Self-pay | Admitting: Family

## 2013-08-16 ENCOUNTER — Ambulatory Visit (INDEPENDENT_AMBULATORY_CARE_PROVIDER_SITE_OTHER): Payer: Medicaid Other | Admitting: *Deleted

## 2013-08-16 ENCOUNTER — Encounter: Payer: Self-pay | Admitting: Obstetrics & Gynecology

## 2013-08-16 DIAGNOSIS — E119 Type 2 diabetes mellitus without complications: Secondary | ICD-10-CM

## 2013-08-16 DIAGNOSIS — Z3687 Encounter for antenatal screening for uncertain dates: Secondary | ICD-10-CM

## 2013-08-16 DIAGNOSIS — Z3201 Encounter for pregnancy test, result positive: Secondary | ICD-10-CM

## 2013-08-17 LAB — OBSTETRIC PANEL
Basophils Relative: 0 % (ref 0–1)
Eosinophils Absolute: 0.1 10*3/uL (ref 0.0–0.7)
Eosinophils Relative: 1 % (ref 0–5)
Hepatitis B Surface Ag: NEGATIVE
Lymphs Abs: 2.5 10*3/uL (ref 0.7–4.0)
MCH: 23.4 pg — ABNORMAL LOW (ref 26.0–34.0)
MCHC: 33.4 g/dL (ref 30.0–36.0)
MCV: 69.9 fL — ABNORMAL LOW (ref 78.0–100.0)
Neutrophils Relative %: 58 % (ref 43–77)
Platelets: 369 10*3/uL (ref 150–400)
RDW: 19 % — ABNORMAL HIGH (ref 11.5–15.5)

## 2013-08-17 LAB — HIV ANTIBODY (ROUTINE TESTING W REFLEX): HIV: NONREACTIVE

## 2013-08-18 ENCOUNTER — Ambulatory Visit (HOSPITAL_COMMUNITY)
Admission: RE | Admit: 2013-08-18 | Discharge: 2013-08-18 | Disposition: A | Discharge status: Home / Self Care | Payer: Medicaid Other | Source: Ambulatory Visit | Attending: Obstetrics & Gynecology | Admitting: Obstetrics & Gynecology

## 2013-08-18 DIAGNOSIS — Z3689 Encounter for other specified antenatal screening: Secondary | ICD-10-CM | POA: Insufficient documentation

## 2013-08-18 DIAGNOSIS — E119 Type 2 diabetes mellitus without complications: Secondary | ICD-10-CM

## 2013-08-18 DIAGNOSIS — O24919 Unspecified diabetes mellitus in pregnancy, unspecified trimester: Secondary | ICD-10-CM | POA: Insufficient documentation

## 2013-08-18 LAB — HEMOGLOBINOPATHY EVALUATION
Hemoglobin Other: 0 %
Hgb A2 Quant: 2.3 % (ref 2.2–3.2)
Hgb A: 97.7 % (ref 96.8–97.8)
Hgb S Quant: 0 %

## 2013-08-24 ENCOUNTER — Encounter: Payer: Self-pay | Admitting: Obstetrics & Gynecology

## 2013-08-24 DIAGNOSIS — O26899 Other specified pregnancy related conditions, unspecified trimester: Secondary | ICD-10-CM

## 2013-08-24 DIAGNOSIS — Z6791 Unspecified blood type, Rh negative: Secondary | ICD-10-CM | POA: Insufficient documentation

## 2013-09-11 ENCOUNTER — Encounter: Payer: Self-pay | Admitting: Obstetrics and Gynecology

## 2013-09-11 ENCOUNTER — Ambulatory Visit (INDEPENDENT_AMBULATORY_CARE_PROVIDER_SITE_OTHER): Payer: Medicaid Other | Admitting: Obstetrics and Gynecology

## 2013-09-11 VITALS — Temp 98.1°F | Wt 167.2 lb

## 2013-09-11 DIAGNOSIS — O24919 Unspecified diabetes mellitus in pregnancy, unspecified trimester: Secondary | ICD-10-CM | POA: Insufficient documentation

## 2013-09-11 DIAGNOSIS — O24912 Unspecified diabetes mellitus in pregnancy, second trimester: Secondary | ICD-10-CM

## 2013-09-11 DIAGNOSIS — O09892 Supervision of other high risk pregnancies, second trimester: Secondary | ICD-10-CM

## 2013-09-11 DIAGNOSIS — O09899 Supervision of other high risk pregnancies, unspecified trimester: Secondary | ICD-10-CM

## 2013-09-11 DIAGNOSIS — O0992 Supervision of high risk pregnancy, unspecified, second trimester: Secondary | ICD-10-CM

## 2013-09-11 DIAGNOSIS — O0990 Supervision of high risk pregnancy, unspecified, unspecified trimester: Secondary | ICD-10-CM | POA: Insufficient documentation

## 2013-09-11 DIAGNOSIS — O360121 Maternal care for anti-D [Rh] antibodies, second trimester, fetus 1: Secondary | ICD-10-CM

## 2013-09-11 DIAGNOSIS — O360990 Maternal care for other rhesus isoimmunization, unspecified trimester, not applicable or unspecified: Secondary | ICD-10-CM

## 2013-09-11 LAB — POCT URINALYSIS DIP (DEVICE)
Bilirubin Urine: NEGATIVE
Glucose, UA: NEGATIVE mg/dL
Hgb urine dipstick: NEGATIVE
Specific Gravity, Urine: 1.025 (ref 1.005–1.030)
pH: 5.5 (ref 5.0–8.0)

## 2013-09-11 LAB — TSH: TSH: 0.853 u[IU]/mL (ref 0.350–4.500)

## 2013-09-11 NOTE — Progress Notes (Signed)
   Subjective:    Cindy Bruce is a [id number] [redacted]w[redacted]d being seen today for her first obstetrical visit.  Her obstetrical history is significant for type @ DM, short interval between preganncy and Rh negative status. Patient does intend to breast feed. Pregnancy history fully reviewed.  Patient reports no complaints.  Filed Vitals:   09/11/13 1022  Temp: 98.1 F (36.7 C)  Weight: 167 lb 3.2 oz (75.841 kg)    HISTORY: OB History  Gravida Para Term Preterm AB SAB TAB Ectopic Multiple Living  3 2 2  0 0 0 0 0 0 2    # Outcome Date GA Lbr Len/2nd Weight Sex Delivery Anes PTL Lv  3 CUR           2 TRM 01/06/13 [redacted]w[redacted]d 12:38 / 00:21 5 lb 7.6 oz (2.483 kg) M SVD EPI  Y  1 TRM 10/16/07 [redacted]w[redacted]d  6 lb 7 oz (2.92 kg) F SVD EPI N Y     Past Medical History  Diagnosis Date  . Asthma   . 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 Tooth extraction     Family History  Problem Relation Age of Onset  . Diabetes Mother   . Diabetes Maternal Grandmother      Exam    Uterus:     Pelvic Exam:    Perineum: Normal Perineum   Vulva: normal   Vagina:  normal mucosa, normal discharge   pH:    Cervix: closed and long   Adnexa: normal adnexa   Bony Pelvis: android  System: Breast:  normal appearance, no masses or tenderness   Skin: normal coloration and turgor, no rashes    Neurologic: oriented, no focal deficits   Extremities: normal strength, tone, and muscle mass   HEENT extra ocular movement intact   Mouth/Teeth mucous membranes moist, pharynx normal without lesions   Neck supple and no masses   Cardiovascular: regular rate and rhythm   Respiratory:  chest clear, no wheezing, crepitations, rhonchi, normal symmetric air entry   Abdomen: soft, non-tender; bowel sounds normal; no masses,  no organomegaly   Urinary:       Assessment:    Pregnancy: [id number] Patient Active Problem List   Diagnosis Date Noted  .  Supervision of high-risk pregnancy 09/11/2013  . Diabetes mellitus complicating pregnancy 09/11/2013  . Short interval between pregnancies complicating pregnancy, antepartum 09/11/2013  . Rh negative state in antepartum period 08/24/2013  . Asthma 07/11/2012  . Rubella non-immune status, antepartum 07/11/2012        Plan:     Initial labs drawn. Prenatal vitamins. Problem list reviewed and updated. Genetic Screening discussed Quad Screen: declined.  Ultrasound discussed; fetal survey: requested. Patient has not been checking CBG's Will order baseline labs  Follow up in 2 weeks. 50% of 30 min visit spent on counseling and coordination of care.     Cindy Bruce 09/11/2013

## 2013-09-11 NOTE — Patient Instructions (Signed)
Pregnancy - Second Trimester The second trimester of pregnancy (3 to 6 months) is a period of rapid growth for you and your baby. At the end of the sixth month, your baby is about 9 inches long and weighs 1 1/2 pounds. You will begin to feel the baby move between 18 and 20 weeks of the pregnancy. This is called quickening. Weight gain is faster. A clear fluid (colostrum) may leak out of your breasts. You may feel small contractions of the womb (uterus). This is known as false labor or Braxton-Hicks contractions. This is like a practice for labor when the baby is ready to be born. Usually, the problems with morning sickness have usually passed by the end of your first trimester. Some women develop small dark blotches (called cholasma, mask of pregnancy) on their face that usually goes away after the baby is born. Exposure to the sun makes the blotches worse. Acne may also develop in some pregnant women and pregnant women who have acne, may find that it goes away. PRENATAL EXAMS  Blood work may continue to be done during prenatal exams. These tests are done to check on your health and the probable health of your baby. Blood work is used to follow your blood levels (hemoglobin). Anemia (low hemoglobin) is common during pregnancy. Iron and vitamins are given to help prevent this. You will also be checked for diabetes between 24 and 28 weeks of the pregnancy. Some of the previous blood tests may be repeated.  The size of the uterus is measured during each visit. This is to make sure that the baby is continuing to grow properly according to the dates of the pregnancy.  Your blood pressure is checked every prenatal visit. This is to make sure you are not getting toxemia.  Your urine is checked to make sure you do not have an infection, diabetes or protein in the urine.  Your weight is checked often to make sure gains are happening at the suggested rate. This is to ensure that both you and your baby are  growing normally.  Sometimes, an ultrasound is performed to confirm the proper growth and development of the baby. This is a test which bounces harmless sound waves off the baby so your caregiver can more accurately determine due dates. Sometimes, a test is done on the amniotic fluid surrounding the baby. This test is called an amniocentesis. The amniotic fluid is obtained by sticking a needle into the belly (abdomen). This is done to check the chromosomes in instances where there is a concern about possible genetic problems with the baby. It is also sometimes done near the end of pregnancy if an early delivery is required. In this case, it is done to help make sure the baby's lungs are mature enough for the baby to live outside of the womb. CHANGES OCCURING IN THE SECOND TRIMESTER OF PREGNANCY Your body goes through many changes during pregnancy. They vary from person to person. Talk to your caregiver about changes you notice that you are concerned about.  During the second trimester, you will likely have an increase in your appetite. It is normal to have cravings for certain foods. This varies from person to person and pregnancy to pregnancy.  Your lower abdomen will begin to bulge.  You may have to urinate more often because the uterus and baby are pressing on your bladder. It is also common to get more bladder infections during pregnancy. You can help this by drinking lots of fluids   and emptying your bladder before and after intercourse.  You may begin to get stretch marks on your hips, abdomen, and breasts. These are normal changes in the body during pregnancy. There are no exercises or medicines to take that prevent this change.  You may begin to develop swollen and bulging veins (varicose veins) in your legs. Wearing support hose, elevating your feet for 15 minutes, 3 to 4 times a day and limiting salt in your diet helps lessen the problem.  Heartburn may develop as the uterus grows and  pushes up against the stomach. Antacids recommended by your caregiver helps with this problem. Also, eating smaller meals 4 to 5 times a day helps.  Constipation can be treated with a stool softener or adding bulk to your diet. Drinking lots of fluids, and eating vegetables, fruits, and whole grains are helpful.  Exercising is also helpful. If you have been very active up until your pregnancy, most of these activities can be continued during your pregnancy. If you have been less active, it is helpful to start an exercise program such as walking.  Hemorrhoids may develop at the end of the second trimester. Warm sitz baths and hemorrhoid cream recommended by your caregiver helps hemorrhoid problems.  Backaches may develop during this time of your pregnancy. Avoid heavy lifting, wear low heal shoes, and practice good posture to help with backache problems.  Some pregnant women develop tingling and numbness of their hand and fingers because of swelling and tightening of ligaments in the wrist (carpel tunnel syndrome). This goes away after the baby is born.  As your breasts enlarge, you may have to get a bigger bra. Get a comfortable, cotton, support bra. Do not get a nursing bra until the last month of the pregnancy if you will be nursing the baby.  You may get a dark line from your belly button to the pubic area called the linea nigra.  You may develop rosy cheeks because of increase blood flow to the face.  You may develop spider looking lines of the face, neck, arms, and chest. These go away after the baby is born. HOME CARE INSTRUCTIONS   It is extremely important to avoid all smoking, herbs, alcohol, and unprescribed drugs during your pregnancy. These chemicals affect the formation and growth of the baby. Avoid these chemicals throughout the pregnancy to ensure the delivery of a healthy infant.  Most of your home care instructions are the same as suggested for the first trimester of your  pregnancy. Keep your caregiver's appointments. Follow your caregiver's instructions regarding medicine use, exercise, and diet.  During pregnancy, you are providing food for you and your baby. Continue to eat regular, well-balanced meals. Choose foods such as meat, fish, milk and other low fat dairy products, vegetables, fruits, and whole-grain breads and cereals. Your caregiver will tell you of the ideal weight gain.  A physical sexual relationship may be continued up until near the end of pregnancy if there are no other problems. Problems could include early (premature) leaking of amniotic fluid from the membranes, vaginal bleeding, abdominal pain, or other medical or pregnancy problems.  Exercise regularly if there are no restrictions. Check with your caregiver if you are unsure of the safety of some of your exercises. The greatest weight gain will occur in the last 2 trimesters of pregnancy. Exercise will help you:  Control your weight.  Get you in shape for labor and delivery.  Lose weight after you have the baby.  Wear   a good support or jogging bra for breast tenderness during pregnancy. This may help if worn during sleep. Pads or tissues may be used in the bra if you are leaking colostrum.  Do not use hot tubs, steam rooms or saunas throughout the pregnancy.  Wear your seat belt at all times when driving. This protects you and your baby if you are in an accident.  Avoid raw meat, uncooked cheese, cat litter boxes, and soil used by cats. These carry germs that can cause birth defects in the baby.  The second trimester is also a good time to visit your dentist for your dental health if this has not been done yet. Getting your teeth cleaned is okay. Use a soft toothbrush. Brush gently during pregnancy.  It is easier to leak urine during pregnancy. Tightening up and strengthening the pelvic muscles will help with this problem. Practice stopping your urination while you are going to the  bathroom. These are the same muscles you need to strengthen. It is also the muscles you would use as if you were trying to stop from passing gas. You can practice tightening these muscles up 10 times a set and repeating this about 3 times per day. Once you know what muscles to tighten up, do not perform these exercises during urination. It is more likely to contribute to an infection by backing up the urine.  Ask for help if you have financial, counseling, or nutritional needs during pregnancy. Your caregiver will be able to offer counseling for these needs as well as refer you for other special needs.  Your skin may become oily. If so, wash your face with mild soap, use non-greasy moisturizer and oil or cream based makeup. MEDICINES AND DRUG USE IN PREGNANCY  Take prenatal vitamins as directed. The vitamin should contain 1 milligram of folic acid. Keep all vitamins out of reach of children. Only a couple vitamins or tablets containing iron may be fatal to a baby or young child when ingested.  Avoid use of all medicines, including herbs, over-the-counter medicines, not prescribed or suggested by your caregiver. Only take over-the-counter or prescription medicines for pain, discomfort, or fever as directed by your caregiver. Do not use aspirin.  Let your caregiver also know about herbs you may be using.  Alcohol is related to a number of birth defects. This includes fetal alcohol syndrome. All alcohol, in any form, should be avoided completely. Smoking will cause low birth rate and premature babies.  Street or illegal drugs are very harmful to the baby. They are absolutely forbidden. A baby born to an addicted mother will be addicted at birth. The baby will go through the same withdrawal an adult does. SEEK MEDICAL CARE IF:  You have any concerns or worries during your pregnancy. It is better to call with your questions if you feel they cannot wait, rather than worry about them. SEEK IMMEDIATE  MEDICAL CARE IF:   An unexplained oral temperature above 102 F (38.9 C) develops, or as your caregiver suggests.  You have leaking of fluid from the vagina (birth canal). If leaking membranes are suspected, take your temperature and tell your caregiver of this when you call.  There is vaginal spotting, bleeding, or passing clots. Tell your caregiver of the amount and how many pads are used. Light spotting in pregnancy is common, especially following intercourse.  You develop a bad smelling vaginal discharge with a change in the color from clear to white.  You continue to feel   sick to your stomach (nauseated) and have no relief from remedies suggested. You vomit blood or coffee ground-like materials.  You lose more than 2 pounds of weight or gain more than 2 pounds of weight over 1 week, or as suggested by your caregiver.  You notice swelling of your face, hands, feet, or legs.  You get exposed to [place] measles and have never had them.  You are exposed to fifth disease or chickenpox.  You develop belly (abdominal) pain. Round ligament discomfort is a common non-cancerous (benign) cause of abdominal pain in pregnancy. Your caregiver still must evaluate you.  You develop a bad headache that does not go away.  You develop fever, diarrhea, pain with urination, or shortness of breath.  You develop visual problems, blurry, or double vision.  You fall or are in a car accident or any kind of trauma.  There is mental or physical violence at home. Document Released: 11/17/2001 Document Revised: 08/17/2012 Document Reviewed: 05/22/2009 [facility] Patient Information 2014 [place], [place].  Contraception Choices Contraception (birth control) is the use of any methods or devices to prevent pregnancy. Below are some methods to help avoid pregnancy. HORMONAL METHODS   Contraceptive implant. This is a thin, plastic tube containing progesterone hormone. It does not contain estrogen hormone. Your  caregiver inserts the tube in the inner part of the upper arm. The tube can remain in place for up to 3 years. After 3 years, the implant must be removed. The implant prevents the ovaries from releasing an egg (ovulation), thickens the cervical mucus which prevents sperm from entering the uterus, and thins the lining of the inside of the uterus.  Progesterone-only injections. These injections are given every 3 months by your caregiver to prevent pregnancy. This synthetic progesterone hormone stops the ovaries from releasing eggs. It also thickens cervical mucus and changes the uterine lining. This makes it harder for sperm to survive in the uterus.  Birth control pills. These pills contain estrogen and progesterone hormone. They work by stopping the egg from forming in the ovary (ovulation). Birth control pills are prescribed by a caregiver.Birth control pills can also be used to treat heavy periods.  Minipill. This type of birth control pill contains only the progesterone hormone. They are taken every day of each month and must be prescribed by your caregiver.  Birth control patch. The patch contains hormones similar to those in birth control pills. It must be changed once a week and is prescribed by a caregiver.  Vaginal ring. The ring contains hormones similar to those in birth control pills. It is left in the vagina for 3 weeks, removed for 1 week, and then a new one is put back in place. The patient must be comfortable inserting and removing the ring from the vagina.A caregiver's prescription is necessary.  Emergency contraception. Emergency contraceptives prevent pregnancy after unprotected sexual intercourse. This pill can be taken right after sex or up to 5 days after unprotected sex. It is most effective the sooner you take the pills after having sexual intercourse. Emergency contraceptive pills are available without a prescription. Check with your pharmacist. Do not use emergency  contraception as your only form of birth control. BARRIER METHODS   Female condom. This is a thin sheath (latex or rubber) that is worn over the penis during sexual intercourse. It can be used with spermicide to increase effectiveness.  Female condom. This is a soft, loose-fitting sheath that is put into the vagina before sexual intercourse.  Diaphragm. This  is a soft, latex, dome-shaped barrier that must be fitted by a caregiver. It is inserted into the vagina, along with a spermicidal jelly. It is inserted before intercourse. The diaphragm should be left in the vagina for 6 to 8 hours after intercourse.  Cervical cap. This is a round, soft, latex or plastic cup that fits over the cervix and must be fitted by a caregiver. The cap can be left in place for up to 48 hours after intercourse.  Sponge. This is a soft, circular piece of polyurethane foam. The sponge has spermicide in it. It is inserted into the vagina after wetting it and before sexual intercourse.  Spermicides. These are chemicals that kill or block sperm from entering the cervix and uterus. They come in the form of creams, jellies, suppositories, foam, or tablets. They do not require a prescription. They are inserted into the vagina with an applicator before having sexual intercourse. The process must be repeated every time you have sexual intercourse. INTRAUTERINE CONTRACEPTION  Intrauterine device (IUD). This is a T-shaped device that is put in a woman's uterus during a menstrual period to prevent pregnancy. There are 2 types:  Copper IUD. This type of IUD is wrapped in copper wire and is placed inside the uterus. Copper makes the uterus and fallopian tubes produce a fluid that kills sperm. It can stay in place for 10 years.  Hormone IUD. This type of IUD contains the hormone progestin (synthetic progesterone). The hormone thickens the cervical mucus and prevents sperm from entering the uterus, and it also thins the uterine lining to  prevent implantation of a fertilized egg. The hormone can weaken or kill the sperm that get into the uterus. It can stay in place for 5 years. PERMANENT METHODS OF CONTRACEPTION  Female tubal ligation. This is when the woman's fallopian tubes are surgically sealed, tied, or blocked to prevent the egg from traveling to the uterus.  Female sterilization. This is when the female has the tubes that carry sperm tied off (vasectomy).This blocks sperm from entering the vagina during sexual intercourse. After the procedure, the man can still ejaculate fluid (semen). NATURAL PLANNING METHODS  Natural family planning. This is not having sexual intercourse or using a barrier method (condom, diaphragm, cervical cap) on days the woman could become pregnant.  Calendar method. This is keeping track of the length of each menstrual cycle and identifying when you are fertile.  Ovulation method. This is avoiding sexual intercourse during ovulation.  Symptothermal method. This is avoiding sexual intercourse during ovulation, using a thermometer and ovulation symptoms.  Post-ovulation method. This is timing sexual intercourse after you have ovulated. Regardless of which type or method of contraception you choose, it is important that you use condoms to protect against the transmission of sexually transmitted diseases (STDs). Talk with your caregiver about which form of contraception is most appropriate for you. Document Released: 11/23/2005 Document Revised: 02/15/2012 Document Reviewed: 04/01/2011 [facility] Patient Information 2014 [place], [place].  Breastfeeding A change in hormones during your pregnancy causes growth of your breast tissue and an increase in number and size of milk ducts. The hormone prolactin allows proteins, sugars, and fats from your blood supply to make breast milk in your milk-producing glands. The hormone progesterone prevents breast milk from being released before the birth of your baby. After  the birth of your baby, your progesterone level decreases allowing breast milk to be released. Thoughts of your baby, as well as his or her sucking or crying, can  stimulate the release of milk from the milk-producing glands. Deciding to breastfeed (nurse) is one of the best choices you can make for you and your baby. The information that follows gives a brief review of the benefits, as well as other important skills to know about breastfeeding. BENEFITS OF BREASTFEEDING For your baby  The first milk (colostrum) helps your baby's digestive system function better.   There are antibodies in your milk that help your baby fight off infections.   Your baby has a lower incidence of asthma, allergies, and sudden infant death syndrome (SIDS).   The nutrients in breast milk are better for your baby than infant formulas.  Breast milk improves your baby's brain development.   Your baby will have less gas, colic, and constipation.  Your baby is less likely to develop other conditions, such as childhood obesity, asthma, or diabetes mellitus. For you  Breastfeeding helps develop a very special bond between you and your baby.   Breastfeeding is convenient, always available at the correct temperature, and costs nothing.   Breastfeeding helps to burn calories and helps you lose the weight gained during pregnancy.   Breastfeeding makes your uterus contract back down to normal size faster and slows bleeding following delivery.   Breastfeeding mothers have a lower risk of developing osteoporosis or breast or ovarian cancer later in life.  BREASTFEEDING FREQUENCY  A healthy, full-term baby may breastfeed as often as every hour or space his or her feedings to every 3 hours. Breastfeeding frequency will vary from baby to baby.   Newborns should be fed no less than every 2 3 hours during the day and every 4 5 hours during the night. You should breastfeed a minimum of 8 feedings in a 24 hour  period.  Awaken your baby to breastfeed if it has been 3 4 hours since the last feeding.  Breastfeed when you feel the need to reduce the fullness of your breasts or when your newborn shows signs of hunger. Signs that your baby may be hungry include:  Increased alertness or activity.  Stretching.  Movement of the head from side to side.  Movement of the head and opening of the mouth when the corner of the mouth or cheek is stroked (rooting).  Increased sucking sounds, smacking lips, cooing, sighing, or squeaking.  Hand-to-mouth movements.  Increased sucking of fingers or hands.  Fussing.  Intermittent crying.  Signs of extreme hunger will require calming and consoling before you try to feed your baby. Signs of extreme hunger may include:  Restlessness.  A loud, strong cry.  Screaming.  Frequent feeding will help you make more milk and will help prevent problems, such as sore nipples and engorgement of the breasts.  BREASTFEEDING   Whether lying down or sitting, be sure that the baby's abdomen is facing your abdomen.   Support your breast with 4 fingers under your breast and your thumb above your nipple. Make sure your fingers are well away from your nipple and your baby's mouth.   Stroke your baby's lips gently with your finger or nipple.   When your baby's mouth is open wide enough, place all of your nipple and as much of the colored area around your nipple (areola) as possible into your baby's mouth.  More areola should be visible above his or her upper lip than below his or her lower lip.  Your baby's tongue should be between his or her lower gum and your breast.  Ensure that your baby's  mouth is correctly positioned around the nipple (latched). Your baby's lips should create a seal on your breast.  Signs that your baby has effectively latched onto your nipple include:  Tugging or sucking without pain.  Swallowing heard between sucks.  Absent click or  smacking sound.  Muscle movement above and in front of his or her ears with sucking.  Your baby must suck about 2 3 minutes in order to get your milk. Allow your baby to feed on each breast as long as he or she wants. Nurse your baby until he or she unlatches or falls asleep at the first breast, then offer the second breast.  Signs that your baby is full and satisfied include:  A gradual decrease in the number of sucks or complete cessation of sucking.  Falling asleep.  Extension or relaxation of his or her body.  Retention of a small amount of milk in his or her mouth.  Letting go of your breast by himself or herself.  Signs of effective breastfeeding in you include:  Breasts that have increased firmness, weight, and size prior to feeding.  Breasts that are softer after nursing.  Increased milk volume, as well as a change in milk consistency and color by the 5th day of breastfeeding.  Breast fullness relieved by breastfeeding.  Nipples are not sore, cracked, or bleeding.  If needed, break the suction by putting your finger into the corner of your baby's mouth and sliding your finger between his or her gums. Then, remove your breast from his or her mouth.  It is common for babies to spit up a small amount after a feeding.  Babies often swallow air during feeding. This can make babies fussy. Burping your baby between breasts can help with this.  Vitamin D supplements are recommended for babies who get only breast milk.  Avoid using a pacifier during your baby's first 4 6 weeks.  Avoid supplemental feedings of water, formula, or juice in place of breastfeeding. Breast milk is all the food your baby needs. It is not necessary for your baby to have water or formula. Your breasts will make more milk if supplemental feedings are avoided during the early weeks. HOW TO TELL WHETHER YOUR BABY IS GETTING ENOUGH BREAST MILK Wondering whether or not your baby is getting enough milk is a  common concern among mothers. You can be assured that your baby is getting enough milk if:   Your baby is actively sucking and you hear swallowing.   Your baby seems relaxed and satisfied after a feeding.   Your baby nurses at least 8 12 times in a 24 hour time period.  During the first 59 76 days of age:  Your baby is wetting at least 3 5 diapers in a 24 hour period. The urine should be clear and pale yellow.  Your baby is having at least 3 4 stools in a 24 hour period. The stool should be soft and yellow.  At 29 74 days of age, your baby is having at least 3 6 stools in a 24 hour period. The stool should be seedy and yellow by 79 days of age.  Your baby has a weight loss less than 7 10% during the first 61 days of age.  Your baby does not lose weight after 51 65 days of age.  Your baby gains 4 7 ounces each week after he or she is 80 days of age.  Your baby gains weight by 5 days of  age and is back to birth weight within 2 weeks. ENGORGEMENT In the first week after your baby is born, you may experience extremely full breasts (engorgement). When engorged, your breasts may feel heavy, warm, or tender to the touch. Engorgement peaks within 24 48 hours after delivery of your baby.  Engorgement may be reduced by:  Continuing to breastfeed.  Increasing the frequency of breastfeeding.  Taking warm showers or applying warm, moist heat to your breasts just before each feeding. This increases circulation and helps the milk flow.   Gently massaging your breast before and during the feedings. With your fingertips, massage from your chest wall towards your nipple in a circular motion.   Ensuring that your baby empties at least one breast at every feeding. It also helps to start the next feeding on the opposite breast.   Expressing breast milk by hand or by using a breast pump to empty the breasts if your baby is sleepy, or not nursing well. You may also want to express milk if you are  returning to work oryou feel you are getting engorged.  Ensuring your baby is latched on and positioned properly while breastfeeding. If you follow these suggestions, your engorgement should improve in 24 48 hours. If you are still experiencing difficulty, call your lactation consultant or caregiver.  CARING FOR YOURSELF Take care of your breasts.  Bathe or shower daily.   Avoid using soap on your nipples.   Wear a supportive bra. Avoid wearing underwire style bras.  Air dry your nipples for a 3 after each feeding.   Use only cotton bra pads to absorb breast milk leakage. Leaking of breast milk between feedings is normal.   Use only pure lanolin on your nipples after nursing. You do not need to wash it off before feeding your baby again. Another option is to express a few drops of breast milk and gently massage that milk into your nipples.  Continue breast self-awareness checks. Take care of yourself.  Eat healthy foods. Alternate 3 meals with 3 snacks.  Avoid foods that you notice affect your baby in a bad way.  Drink milk, fruit juice, and water to satisfy your thirst (about 8 glasses a day).   Rest often, relax, and take your prenatal vitamins to prevent fatigue, stress, and anemia.  Avoid chewing and smoking tobacco.  Avoid alcohol and drug use.  Take over-the-counter and prescribed medicine only as directed by your caregiver or pharmacist. You should always check with your caregiver or pharmacist before taking any new medicine, vitamin, or herbal supplement.  Know that pregnancy is possible while breastfeeding. If desired, talk to your caregiver about family planning and safe birth control methods that may be used while breastfeeding. SEEK MEDICAL CARE IF:   You feel like you want to stop breastfeeding or have become frustrated with breastfeeding.  You have painful breasts or nipples.  Your nipples are cracked or bleeding.  Your breasts are red,  tender, or warm.  You have a swollen area on either breast.  You have a fever or chills.  You have nausea or vomiting.  You have drainage from your nipples.  Your breasts do not become full before feedings by the 5th day after delivery.  You feel sad and depressed.  Your baby is too sleepy to eat well.  Your baby is having trouble sleeping.   Your baby is wetting less than 3 diapers in a 24 hour period.  Your baby has less than  3 stools in a 24 hour period.  Your baby's skin or the white part of his or her eyes becomes more yellow.   Your baby is not gaining weight by 15 days of age. MAKE SURE YOU:   Understand these instructions.  Will watch your condition.  Will get help right away if you are not doing well or get worse. Document Released: 11/23/2005 Document Revised: 08/17/2012 Document Reviewed: 06/29/2012 [facility] Patient Information 2014 [place], [place].

## 2013-09-11 NOTE — Progress Notes (Signed)
Pulse- Patient reports itchy white d/c

## 2013-09-12 ENCOUNTER — Encounter: Payer: Self-pay | Admitting: Obstetrics and Gynecology

## 2013-09-12 LAB — OBSTETRIC PANEL
Antibody Screen: NEGATIVE
Basophils Relative: 0 % (ref 0–1)
Eosinophils Absolute: 0 10*3/uL (ref 0.0–0.7)
HCT: 32.7 % — ABNORMAL LOW (ref 36.0–46.0)
Hemoglobin: 11.2 g/dL — ABNORMAL LOW (ref 12.0–15.0)
Hepatitis B Surface Ag: NEGATIVE
Lymphs Abs: 2.2 10*3/uL (ref 0.7–4.0)
MCH: 23.8 pg — ABNORMAL LOW (ref 26.0–34.0)
MCHC: 34.3 g/dL (ref 30.0–36.0)
Monocytes Absolute: 0.5 10*3/uL (ref 0.1–1.0)
Monocytes Relative: 8 % (ref 3–12)
Neutrophils Relative %: 58 % (ref 43–77)
RBC: 4.71 MIL/uL (ref 3.87–5.11)
Rh Type: NEGATIVE

## 2013-09-13 LAB — HEMOGLOBINOPATHY EVALUATION
Hemoglobin Other: 0 %
Hgb A2 Quant: 2.4 % (ref 2.2–3.2)
Hgb A: 97.6 % (ref 96.8–97.8)

## 2013-09-14 ENCOUNTER — Other Ambulatory Visit: Payer: Self-pay | Admitting: Obstetrics and Gynecology

## 2013-09-14 ENCOUNTER — Encounter: Payer: Self-pay | Admitting: Obstetrics and Gynecology

## 2013-09-14 LAB — CULTURE, OB URINE: Colony Count: 10000

## 2013-09-14 MED ORDER — PENICILLIN V POTASSIUM 500 MG PO TABS: 500.0000 mg | ORAL_TABLET | Freq: Four times a day (QID) | ORAL | Status: DC

## 2013-09-19 ENCOUNTER — Telehealth: Payer: Self-pay

## 2013-09-19 NOTE — Telephone Encounter (Signed)
Called pt unable to leave message due to message stating "caller not accepting incoming calls at this time." Sent certified letter and documented in the appt 09/25/13 notes to follow up with pt.

## 2013-09-19 NOTE — Telephone Encounter (Signed)
Message copied by Faythe Casa on Tue Sep 19, 2013  3:00 PM ------      Message from: CONSTANT, PEGGY      Created: Thu Sep 14, 2013 10:19 AM       PLease inform patient of positive UTI. Rx e-prescribed            Peggy ------

## 2013-09-25 ENCOUNTER — Encounter: Payer: Self-pay | Admitting: Obstetrics and Gynecology

## 2013-09-26 LAB — COMPREHENSIVE METABOLIC PANEL
ALT: <8 U/L (ref 0–35)
AST: 9 U/L (ref 0–37)
Alkaline Phosphatase: 50 U/L (ref 39–117)
CO2: 20 mEq/L (ref 19–32)
Creat: 0.49 mg/dL — ABNORMAL LOW (ref 0.50–1.10)
Glucose, Bld: 130 mg/dL — ABNORMAL HIGH (ref 70–99)
Sodium: 133 mEq/L — ABNORMAL LOW (ref 135–145)
Total Bilirubin: 0.2 mg/dL — ABNORMAL LOW (ref 0.3–1.2)

## 2013-09-26 LAB — PROTEIN, URINE, 24 HOUR
Protein, 24H Urine: 72 mg/d (ref 50–100)
Protein, Urine: 11 mg/dL

## 2013-09-27 ENCOUNTER — Encounter: Payer: Self-pay | Admitting: Obstetrics and Gynecology

## 2013-09-27 DIAGNOSIS — O2340 Unspecified infection of urinary tract in pregnancy, unspecified trimester: Secondary | ICD-10-CM

## 2013-09-27 DIAGNOSIS — B951 Streptococcus, group B, as the cause of diseases classified elsewhere: Secondary | ICD-10-CM | POA: Insufficient documentation

## 2013-09-28 ENCOUNTER — Encounter: Payer: Self-pay | Admitting: *Deleted

## 2013-10-09 ENCOUNTER — Ambulatory Visit (INDEPENDENT_AMBULATORY_CARE_PROVIDER_SITE_OTHER): Payer: Medicaid Other | Admitting: Obstetrics & Gynecology

## 2013-10-09 VITALS — BP 133/71 | Wt 169.1 lb

## 2013-10-09 DIAGNOSIS — O0992 Supervision of high risk pregnancy, unspecified, second trimester: Secondary | ICD-10-CM

## 2013-10-09 DIAGNOSIS — O09899 Supervision of other high risk pregnancies, unspecified trimester: Secondary | ICD-10-CM

## 2013-10-09 DIAGNOSIS — O09892 Supervision of other high risk pregnancies, second trimester: Secondary | ICD-10-CM

## 2013-10-09 DIAGNOSIS — O0990 Supervision of high risk pregnancy, unspecified, unspecified trimester: Secondary | ICD-10-CM

## 2013-10-09 DIAGNOSIS — O360121 Maternal care for anti-D [Rh] antibodies, second trimester, fetus 1: Secondary | ICD-10-CM

## 2013-10-09 DIAGNOSIS — Z283 Underimmunization status: Secondary | ICD-10-CM

## 2013-10-09 DIAGNOSIS — O9989 Other specified diseases and conditions complicating pregnancy, childbirth and the puerperium: Secondary | ICD-10-CM

## 2013-10-09 DIAGNOSIS — Z3490 Encounter for supervision of normal pregnancy, unspecified, unspecified trimester: Secondary | ICD-10-CM

## 2013-10-09 DIAGNOSIS — O360990 Maternal care for other rhesus isoimmunization, unspecified trimester, not applicable or unspecified: Secondary | ICD-10-CM

## 2013-10-09 DIAGNOSIS — B951 Streptococcus, group B, as the cause of diseases classified elsewhere: Secondary | ICD-10-CM

## 2013-10-09 DIAGNOSIS — O24912 Unspecified diabetes mellitus in pregnancy, second trimester: Secondary | ICD-10-CM

## 2013-10-09 DIAGNOSIS — O24919 Unspecified diabetes mellitus in pregnancy, unspecified trimester: Secondary | ICD-10-CM

## 2013-10-09 DIAGNOSIS — O2390 Unspecified genitourinary tract infection in pregnancy, unspecified trimester: Secondary | ICD-10-CM

## 2013-10-09 LAB — POCT URINALYSIS DIP (DEVICE)
Bilirubin Urine: NEGATIVE
Glucose, UA: 100 mg/dL — AB
Nitrite: NEGATIVE
Protein, ur: NEGATIVE mg/dL
Urobilinogen, UA: 1 mg/dL (ref 0.0–1.0)

## 2013-10-09 LAB — GLUCOSE, CAPILLARY: Glucose-Capillary: 100 mg/dL — ABNORMAL HIGH (ref 70–99)

## 2013-10-09 MED ORDER — AMOXICILLIN 500 MG PO CAPS: 500.0000 mg | ORAL_CAPSULE | Freq: Three times a day (TID) | ORAL | Status: DC

## 2013-10-09 NOTE — Progress Notes (Signed)
Unable to check fingersticks, has Accu-Chek meter but no strips.  Medicaid pending.  Will send to DM educator to see what we can do.  Random CBG 100.  Declines quad and flu vaccine.  Anatomy scan scheduled.  No other complaints or concerns.  Routine obstetric precautions reviewed.

## 2013-10-09 NOTE — Progress Notes (Signed)
Pulse: 99 Pt went to pick up abx but it wasn't at the pharmacy. Needs it to be resent.

## 2013-10-09 NOTE — Patient Instructions (Signed)
Return to clinic for any obstetric concerns or go to MAU for evaluation  

## 2013-10-09 NOTE — Progress Notes (Signed)
Patient routed to Diabetes RN, Nutrition and SWK. I called patient 3 times with no response.

## 2013-10-09 NOTE — Progress Notes (Signed)
U/S scheduled 10/26/13 at 1030 am with MFM.

## 2013-10-12 ENCOUNTER — Other Ambulatory Visit: Payer: Self-pay

## 2013-10-26 ENCOUNTER — Ambulatory Visit (HOSPITAL_COMMUNITY)
Admission: RE | Admit: 2013-10-26 | Discharge: 2013-10-26 | Disposition: A | Discharge status: Home / Self Care | Payer: Medicaid Other | Source: Ambulatory Visit | Attending: Obstetrics & Gynecology | Admitting: Obstetrics & Gynecology

## 2013-10-26 VITALS — BP 122/75 | HR 106 | Wt 173.0 lb

## 2013-10-26 DIAGNOSIS — O24912 Unspecified diabetes mellitus in pregnancy, second trimester: Secondary | ICD-10-CM

## 2013-10-26 DIAGNOSIS — O24919 Unspecified diabetes mellitus in pregnancy, unspecified trimester: Secondary | ICD-10-CM | POA: Insufficient documentation

## 2013-10-26 NOTE — Progress Notes (Signed)
Cindy Bruce  was seen today for an ultrasound appointment.  See full report in [facility].  Impression: Single IUP at 20 4/7 weeks Poorly controlled diabetes Normal anatomic fetal survey Ultrasound measurements consistent with dates No markers associated with aneuploidy noted Normal amniotic fluid volume  Recommendations: Recommend follow-up ultrasound examination in 4 weeks for interval growth Fetal echo at ~ 22 weeks (scheduled)  Alpha Gula, MD

## 2013-10-28 ENCOUNTER — Encounter: Payer: Self-pay | Admitting: Obstetrics & Gynecology

## 2013-11-06 ENCOUNTER — Encounter: Payer: Self-pay | Admitting: Obstetrics & Gynecology

## 2013-11-10 ENCOUNTER — Encounter (HOSPITAL_COMMUNITY): Payer: Self-pay | Admitting: *Deleted

## 2013-11-10 ENCOUNTER — Inpatient Hospital Stay (HOSPITAL_COMMUNITY)
Admission: AD | Admit: 2013-11-10 | Discharge: 2013-11-10 | Disposition: A | Discharge status: Home / Self Care | Payer: Medicaid Other | Source: Ambulatory Visit | Attending: Family Medicine | Admitting: Family Medicine

## 2013-11-10 DIAGNOSIS — B3731 Acute candidiasis of vulva and vagina: Secondary | ICD-10-CM | POA: Insufficient documentation

## 2013-11-10 DIAGNOSIS — O2390 Unspecified genitourinary tract infection in pregnancy, unspecified trimester: Secondary | ICD-10-CM | POA: Insufficient documentation

## 2013-11-10 DIAGNOSIS — B373 Candidiasis of vulva and vagina: Secondary | ICD-10-CM | POA: Insufficient documentation

## 2013-11-10 DIAGNOSIS — N760 Acute vaginitis: Secondary | ICD-10-CM | POA: Insufficient documentation

## 2013-11-10 DIAGNOSIS — B9689 Other specified bacterial agents as the cause of diseases classified elsewhere: Secondary | ICD-10-CM | POA: Insufficient documentation

## 2013-11-10 DIAGNOSIS — A499 Bacterial infection, unspecified: Secondary | ICD-10-CM | POA: Insufficient documentation

## 2013-11-10 LAB — URINE MICROSCOPIC-ADD ON

## 2013-11-10 LAB — URINALYSIS, ROUTINE W REFLEX MICROSCOPIC
Bilirubin Urine: NEGATIVE
Glucose, UA: 100 mg/dL — AB
Ketones, ur: 15 mg/dL — AB
Nitrite: NEGATIVE
Protein, ur: NEGATIVE mg/dL
pH: 6 (ref 5.0–8.0)

## 2013-11-10 LAB — WET PREP, GENITAL

## 2013-11-10 MED ORDER — FLUCONAZOLE 150 MG PO TABS: 150.0000 mg | ORAL_TABLET | Freq: Every day | ORAL | Status: DC

## 2013-11-10 MED ORDER — METRONIDAZOLE 500 MG PO TABS: 500.0000 mg | ORAL_TABLET | Freq: Two times a day (BID) | ORAL | Status: DC

## 2013-11-10 NOTE — MAU Note (Signed)
States she has felt "contractions and cramping" for 2 weeks. Goes to [employer].

## 2013-11-10 NOTE — MAU Provider Note (Signed)
Chief Complaint:  Contractions   First Provider Initiated Contact with Patient 11/10/13 1901      HPI: Cindy Bruce is a 22 y.o. G3P2002 at 2w0dwho presents to maternity admissions reporting ctx x 1 week.  She is unable to report how often they are but reports they are painful, sharp, and increase when she moves around.  She reports she went to her [facility] appointment ~1 month ago but does not have any scheduled appointments coming up soon. She reports good fetal movement, denies LOF, vaginal bleeding, vaginal itching/burning, urinary symptoms, h/a, dizziness, n/v, or fever/chills.     Past Medical History: Past Medical History  Diagnosis Date  . Asthma   . Diabetes mellitus     Type 2 since 1st pg in 2008 - on insulin    Past obstetric history: OB History  Gravida Para Term Preterm AB SAB TAB Ectopic Multiple Living  3 2 2  0 0 0 0 0 0 2    # Outcome Date GA Lbr Len/2nd Weight Sex Delivery Anes PTL Lv  3 CUR           2 TRM 01/06/13 [redacted]w[redacted]d 12:38 / 00:21 2.483 kg (5 lb 7.6 oz) M SVD EPI  Y  1 TRM 10/16/07 [redacted]w[redacted]d  2.92 kg (6 lb 7 oz) F SVD EPI N Y      Past Surgical History: Past Surgical History  Procedure Laterality Date  . Tooth extraction      Family History: Family History  Problem Relation Age of Onset  . Diabetes Mother   . Diabetes Maternal Grandmother     Social History: History  Substance Use Topics  . Smoking status: Former Smoker -- 0.25 packs/day    Types: Cigarettes  . Smokeless tobacco: Former Neurosurgeon    Quit date: 09/05/2012  . Alcohol Use: No    Allergies: No Known Allergies  Meds:  Prescriptions prior to admission  Medication Sig Dispense Refill  . amoxicillin (AMOXIL) 500 MG capsule Take 1 capsule (500 mg total) by mouth 3 (three) times daily.  21 capsule  2  . metFORMIN (GLUCOPHAGE) 1000 MG tablet Take 1 tablet (1,000 mg total) by mouth 2 (two) times daily with a meal.  60 tablet  2    ROS: Pertinent findings in history of present  illness.  Physical Exam  Blood pressure 115/60, pulse 88, temperature 98.8 F (37.1 C), temperature source Oral, resp. rate 18, height 5\' 1"  (1.549 m), weight 78.926 kg (174 lb), unknown if currently breastfeeding. GENERAL: Well-developed, well-nourished female in no acute distress.  HEENT: normocephalic HEART: normal rate RESP: normal effort ABDOMEN: Soft, non-tender, gravid appropriate for gestational age EXTREMITIES: Nontender, no edema NEURO: alert and oriented SPECULUM EXAM: NEFG, physiologic discharge, no blood, cervix clean Dilation: Closed Effacement (%): Thick Cervical Position: Posterior Exam by:: Estill Bamberg CNM  FHT:  Baseline 150 , moderate variability, accelerations present (10x10), no decelerations Contractions: None on toco or to palpation   Labs: Results for orders placed during the hospital encounter of 11/10/13 (from the past 24 hour(s))  URINALYSIS, ROUTINE W REFLEX MICROSCOPIC     Status: Abnormal   Collection Time    11/10/13  6:21 PM      Result Value Range   Color, Urine YELLOW  YELLOW   APPearance CLEAR  CLEAR   Specific Gravity, Urine >1.030 (*) 1.005 - 1.030   pH 6.0  5.0 - 8.0   Glucose, UA 100 (*) NEGATIVE mg/dL   Hgb urine dipstick NEGATIVE  NEGATIVE   Bilirubin Urine NEGATIVE  NEGATIVE   Ketones, ur 15 (*) NEGATIVE mg/dL   Protein, ur NEGATIVE  NEGATIVE mg/dL   Urobilinogen, UA 2.0 (*) 0.0 - 1.0 mg/dL   Nitrite NEGATIVE  NEGATIVE   Leukocytes, UA SMALL (*) NEGATIVE  URINE MICROSCOPIC-ADD ON     Status: Abnormal   Collection Time    11/10/13  6:21 PM      Result Value Range   Squamous Epithelial / LPF FEW (*) RARE   WBC, UA 3-6  <3 WBC/hpf   RBC / HPF 3-6  <3 RBC/hpf   Bacteria, UA MANY (*) RARE   Urine-Other MUCOUS PRESENT    WET PREP, GENITAL     Status: Abnormal   Collection Time    11/10/13  7:06 PM      Result Value Range   Yeast Wet Prep HPF POC FEW (*) NONE SEEN   Trich, Wet Prep NONE SEEN  NONE SEEN   Clue Cells Wet Prep HPF  POC MODERATE (*) NONE SEEN   WBC, Wet Prep HPF POC MODERATE (*) NONE SEEN    Assessment: 1. Vaginal candidiasis   2. Bacterial vaginosis     Plan: Discharge home PTL precautions given Diflucan 150 mg x1 dose, with 1 refill Flagyl 500 mg BID x 7 days Urine sent for culture Call clinic to schedule your next prenatal visit  Return to MAU as needed     Medication List    ASK your doctor about these medications       amoxicillin 500 MG capsule  Commonly known as:  AMOXIL  Take 1 capsule (500 mg total) by mouth 3 (three) times daily.     metFORMIN 1000 MG tablet  Commonly known as:  GLUCOPHAGE  Take 1 tablet (1,000 mg total) by mouth 2 (two) times daily with a meal.        Sharen Counter Certified Nurse-Midwife 11/10/2013 7:55 PM

## 2013-11-12 LAB — URINE CULTURE

## 2013-11-13 NOTE — MAU Provider Note (Signed)
Chart reviewed and agree with management and plan.  

## 2013-11-17 ENCOUNTER — Encounter: Payer: Self-pay | Admitting: *Deleted

## 2013-11-17 ENCOUNTER — Encounter: Payer: Self-pay | Admitting: Family Medicine

## 2013-11-20 ENCOUNTER — Encounter: Payer: Medicaid Other | Admitting: Family Medicine

## 2013-11-23 ENCOUNTER — Ambulatory Visit (HOSPITAL_COMMUNITY)
Admission: RE | Admit: 2013-11-23 | Discharge: 2013-11-23 | Disposition: A | Discharge status: Home / Self Care | Payer: Medicaid Other | Source: Ambulatory Visit | Attending: Obstetrics & Gynecology | Admitting: Obstetrics & Gynecology

## 2013-11-23 DIAGNOSIS — O24919 Unspecified diabetes mellitus in pregnancy, unspecified trimester: Secondary | ICD-10-CM | POA: Insufficient documentation

## 2013-11-23 DIAGNOSIS — O24912 Unspecified diabetes mellitus in pregnancy, second trimester: Secondary | ICD-10-CM

## 2013-11-27 ENCOUNTER — Encounter: Payer: Medicaid Other | Admitting: Obstetrics & Gynecology

## 2013-12-04 ENCOUNTER — Encounter: Payer: Self-pay | Admitting: *Deleted

## 2013-12-04 ENCOUNTER — Ambulatory Visit (INDEPENDENT_AMBULATORY_CARE_PROVIDER_SITE_OTHER): Payer: Medicaid Other | Admitting: Family Medicine

## 2013-12-04 VITALS — BP 126/68 | Temp 99.5°F | Wt 170.3 lb

## 2013-12-04 DIAGNOSIS — O24912 Unspecified diabetes mellitus in pregnancy, second trimester: Secondary | ICD-10-CM

## 2013-12-04 DIAGNOSIS — O24919 Unspecified diabetes mellitus in pregnancy, unspecified trimester: Secondary | ICD-10-CM

## 2013-12-04 LAB — POCT URINALYSIS DIP (DEVICE)
Bilirubin Urine: NEGATIVE
Glucose, UA: NEGATIVE mg/dL
Hgb urine dipstick: NEGATIVE
Ketones, ur: NEGATIVE mg/dL
Urobilinogen, UA: 1 mg/dL (ref 0.0–1.0)
pH: 7 (ref 5.0–8.0)

## 2013-12-04 MED ORDER — ACCU-CHEK FASTCLIX LANCETS MISC: 1.0000 [IU] | Freq: Four times a day (QID) | Status: DC

## 2013-12-04 MED ORDER — GLUCOSE BLOOD VI STRP: ORAL_STRIP | Status: DC

## 2013-12-04 NOTE — Progress Notes (Signed)
Brings no BS-she has no idea what her BS are doing-has finally gotten Medicaid--needs strips--rx sent in No visit since 11/3--no Medicaid--now has in place Has u/s for f/u growth with MFM on 1/15 Had ECHO-requires f/u in 6 wks to re-look at possible VSD. Needs Rhogam and 28 wk labs next visit +  HgbA1C

## 2013-12-04 NOTE — Progress Notes (Signed)
P= 103 Pt. Reports intermittent lower abdomen/pelvic pressure.

## 2013-12-04 NOTE — Patient Instructions (Addendum)
Following an appropriate diet and keeping your blood sugar under control is the most important thing to do for your health and that of your unborn baby.  Please check your blood sugar 4 times daily.  Please keep accurate BS logs and bring them with you to every visit.  Please bring your meter also.  Goals for Blood sugar should be: 1. Fasting (first thing in the morning before eating) should be less than 90.   2.  2 hours after meals should be less than 120.  Please eat 3 meals and 3 snacks.  Include protein (meat, dairy-cheese, eggs, nuts) with all meals.  Be mindful that carbohydrates increase your blood sugar.  Not just sweet food (cookies, cake, donuts, fruit, juice, soda) but also bread, pasta, rice, and potatoes.  You have to limit how many carbs you are eating.  Adding exercise, as little as 30 minutes a day can decrease your blood sugar.  Gestational Diabetes Mellitus Gestational diabetes mellitus, often simply referred to as gestational diabetes, is a type of diabetes that some women develop during pregnancy. In gestational diabetes, the pancreas does not make enough insulin (a hormone), the cells are less responsive to the insulin that is made (insulin resistance), or both.Normally, insulin moves sugars from food into the tissue cells. The tissue cells use the sugars for energy. The lack of insulin or the lack of normal response to insulin causes excess sugars to build up in the blood instead of going into the tissue cells. As a result, high blood sugar (hyperglycemia) develops. The effect of high sugar (glucose) levels can cause many complications.  RISK FACTORS You have an increased chance of developing gestational diabetes if you have a family history of diabetes and also have one or more of the following risk factors:  A body mass index over 30 (obesity).  A previous pregnancy with gestational diabetes.  An older age at the time of pregnancy. If blood glucose levels are kept  in the normal range during pregnancy, women can have a healthy pregnancy. If your blood glucose levels are not well controlled, there may be risks to you, your unborn baby (fetus), your labor and delivery, or your newborn baby.  SYMPTOMS  If symptoms are experienced, they are much like symptoms you would normally expect during pregnancy. The symptoms of gestational diabetes include:   Increased thirst (polydipsia).  Increased urination (polyuria).  Increased urination during the night (nocturia).  Weight loss. This weight loss may be rapid.  Frequent, recurring infections.  Tiredness (fatigue).  Weakness.  Vision changes, such as blurred vision.  Fruity smell to your breath.  Abdominal pain. DIAGNOSIS Diabetes is diagnosed when blood glucose levels are increased. Your blood glucose level may be checked by one or more of the following blood tests:  A fasting blood glucose test. You will not be allowed to eat for at least 8 hours before a blood sample is taken.  A random blood glucose test. Your blood glucose is checked at any time of the day regardless of when you ate.  A hemoglobin A1c blood glucose test. A hemoglobin A1c test provides information about blood glucose control over the previous 3 months.  An oral glucose tolerance test (OGTT). Your blood glucose is measured after you have not eaten (fasted) for 1 3 hours and then after you drink a glucose-containing beverage. Since the hormones that cause insulin resistance are highest at about 24 28 weeks of a pregnancy, an OGTT is usually performed during that   time. If you have risk factors for gestational diabetes, your caregiver may test you for gestational diabetes earlier than 24 weeks of pregnancy. TREATMENT   You will need to take diabetes medicine or insulin daily to keep blood glucose levels in the desired range.  You will need to match insulin dosing with exercise and healthy food choices. The treatment goal is to  maintain the before meal (preprandial), bedtime, and overnight blood glucose level at 60 99 mg/dL during pregnancy. The treatment goal is to further maintain peak after meal blood sugar (postprandial glucose) level at 100 140 mg/dL. HOME CARE INSTRUCTIONS   Have your hemoglobin A1c level checked twice a year.  Perform daily blood glucose monitoring as directed by your caregiver. It is common to perform frequent blood glucose monitoring.  Monitor urine ketones when you are ill and as directed by your caregiver.  Take your diabetes medicine and insulin as directed by your caregiver to maintain your blood glucose level in the desired range.  Never run out of diabetes medicine or insulin. It is needed every day.  Adjust insulin based on your intake of carbohydrates. Carbohydrates can raise blood glucose levels but need to be included in your diet. Carbohydrates provide vitamins, minerals, and fiber which are an essential part of a healthy diet. Carbohydrates are found in fruits, vegetables, whole grains, dairy products, legumes, and foods containing added sugars.    Eat healthy foods. Alternate 3 meals with 3 snacks.  Maintain a healthy weight gain. The usual total expected weight gain varies according to your prepregnancy body mass index (BMI).  Carry a medical alert card or wear your medical alert jewelry.  Carry a 15 gram carbohydrate snack with you at all times to treat low blood glucose (hypoglycemia). Some examples of 15 gram carbohydrate snacks include:  Glucose tablets, 3 or 4   Glucose gel, 15 gram tube  Raisins, 2 tablespoons (24 g)  Jelly beans, 6  Animal crackers, 8  Fruit juice, regular soda, or low fat milk, 4 ounces (120 mL)  Gummy treats, 9    Recognize hypoglycemia. Hypoglycemia during pregnancy occurs with blood glucose levels of 60 mg/dL and below. The risk for hypoglycemia increases when fasting or skipping meals, during or after intense exercise, and during  sleep. Hypoglycemia symptoms can include:  Tremors or shakes.  Decreased ability to concentrate.  Sweating.  Increased heart rate.  Headache.  Dry mouth.  Hunger.  Irritability.  Anxiety.  Restless sleep.  Altered speech or coordination.  Confusion.  Treat hypoglycemia promptly. If you are alert and able to safely swallow, follow the 15:15 rule:  Take 15 20 grams of rapid-acting glucose or carbohydrate. Rapid-acting options include glucose gel, glucose tablets, or 4 ounces (120 mL) of fruit juice, regular soda, or low fat milk.  Check your blood glucose level 15 minutes after taking the glucose.   Take 15 20 grams more of glucose if the repeat blood glucose level is still 70 mg/dL or below.  Eat a meal or snack within 1 hour once blood glucose levels return to normal.  Be alert to polyuria and polydipsia which are early signs of hyperglycemia. An early awareness of hyperglycemia allows for prompt treatment. Treat hyperglycemia as directed by your caregiver.  Engage in at least 30 minutes of physical activity a day or as directed by your caregiver. Ten minutes of physical activity timed 30 minutes after each meal is encouraged to control postprandial blood glucose levels.  Adjust your insulin dosing and   food intake as needed if you start a new exercise or sport.  Follow your sick day plan at any time you are unable to eat or drink as usual.  Avoid tobacco and alcohol use.  Follow up with your caregiver regularly.  Follow the advice of your caregiver regarding your prenatal and post-delivery (postpartum) appointments, meal planning, exercise, medicines, vitamins, blood tests, other medical tests, and physical activities.  Perform daily skin and foot care. Examine your skin and feet daily for cuts, bruises, redness, nail problems, bleeding, blisters, or sores.  Brush your teeth and gums at least twice a day and floss at least once a day. Follow up with your dentist  regularly.  Schedule an eye exam during the first trimester of your pregnancy or as directed by your caregiver.  Share your diabetes management plan with your workplace or school.  Stay up-to-date with immunizations.  Learn to manage stress.  Obtain ongoing diabetes education and support as needed. SEEK MEDICAL CARE IF:   You are unable to eat food or drink fluids for more than 6 hours.  You have nausea and vomiting for more than 6 hours.  You have a blood glucose level of 200 mg/dL and you have ketones in your urine.  There is a change in mental status.  You develop vision problems.  You have a persistent headache.  You have upper abdominal pain or discomfort.  You develop an additional serious illness.  You have diarrhea for more than 6 hours.  You have been sick or have had a fever for a couple of days and are not getting better. SEEK IMMEDIATE MEDICAL CARE IF:   You have difficulty breathing.  You no longer feel the baby moving.  You are bleeding or have discharge from your vagina.  You start having premature contractions or labor. MAKE SURE YOU:  Understand these instructions.  Will watch your condition.  Will get help right away if you are not doing well or get worse. Document Released: 03/01/2001 Document Revised: 03/20/2013 Document Reviewed: 06/21/2012 [facility] Patient Information 2014 [place], [place].  Second Trimester of Pregnancy The second trimester is from week 13 through week 28, months 4 through 6. The second trimester is often a time when you feel your best. Your body has also adjusted to being pregnant, and you begin to feel better physically. Usually, morning sickness has lessened or quit completely, you may have more energy, and you may have an increase in appetite. The second trimester is also a time when the fetus is growing rapidly. At the end of the sixth month, the fetus is about 9 inches long and weighs about 1 pounds. You will likely  begin to feel the baby move (quickening) between 18 and 20 weeks of the pregnancy. BODY CHANGES Your body goes through many changes during pregnancy. The changes vary from woman to woman.   Your weight will continue to increase. You will notice your lower abdomen bulging out.  You may begin to get stretch marks on your hips, abdomen, and breasts.  You may develop headaches that can be relieved by medicines approved by your caregiver.  You may urinate more often because the fetus is pressing on your bladder.  You may develop or continue to have heartburn as a result of your pregnancy.  You may develop constipation because certain hormones are causing the muscles that push waste through your intestines to slow down.  You may develop hemorrhoids or swollen, bulging veins (varicose veins).  You may  have back pain because of the weight gain and pregnancy hormones relaxing your joints between the bones in your pelvis and as a result of a shift in weight and the muscles that support your balance.  Your breasts will continue to grow and be tender.  Your gums may bleed and may be sensitive to brushing and flossing.  Dark spots or blotches (chloasma, mask of pregnancy) may develop on your face. This will likely fade after the baby is born.  A dark line from your belly button to the pubic area (linea nigra) may appear. This will likely fade after the baby is born. WHAT TO EXPECT AT YOUR PRENATAL VISITS During a routine prenatal visit:  You will be weighed to make sure you and the fetus are growing normally.  Your blood pressure will be taken.  Your abdomen will be measured to track your baby's growth.  The fetal heartbeat will be listened to.  Any test results from the previous visit will be discussed. Your caregiver may ask you:  How you are feeling.  If you are feeling the baby move.  If you have had any abnormal symptoms, such as leaking fluid, bleeding, severe headaches, or  abdominal cramping.  If you have any questions. Other tests that may be performed during your second trimester include:  Blood tests that check for:  Low iron levels (anemia).  Gestational diabetes (between 24 and 28 weeks).  Rh antibodies.  Urine tests to check for infections, diabetes, or protein in the urine.  An ultrasound to confirm the proper growth and development of the baby.  An amniocentesis to check for possible genetic problems.  Fetal screens for spina bifida and Down syndrome. HOME CARE INSTRUCTIONS   Avoid all smoking, herbs, alcohol, and unprescribed drugs. These chemicals affect the formation and growth of the baby.  Follow your caregiver's instructions regarding medicine use. There are medicines that are either safe or unsafe to take during pregnancy.  Exercise only as directed by your caregiver. Experiencing uterine cramps is a good sign to stop exercising.  Continue to eat regular, healthy meals.  Wear a good support bra for breast tenderness.  Do not use hot tubs, steam rooms, or saunas.  Wear your seat belt at all times when driving.  Avoid raw meat, uncooked cheese, cat litter boxes, and soil used by cats. These carry germs that can cause birth defects in the baby.  Take your prenatal vitamins.  Try taking a stool softener (if your caregiver approves) if you develop constipation. Eat more high-fiber foods, such as fresh vegetables or fruit and whole grains. Drink plenty of fluids to keep your urine clear or pale yellow.  Take warm sitz baths to soothe any pain or discomfort caused by hemorrhoids. Use hemorrhoid cream if your caregiver approves.  If you develop varicose veins, wear support hose. Elevate your feet for 15 minutes, 3 4 times a day. Limit salt in your diet.  Avoid heavy lifting, wear low heel shoes, and practice good posture.  Rest with your legs elevated if you have leg cramps or low back pain.  Visit your dentist if you have not  gone yet during your pregnancy. Use a soft toothbrush to brush your teeth and be gentle when you floss.  A sexual relationship may be continued unless your caregiver directs you otherwise.  Continue to go to all your prenatal visits as directed by your caregiver. SEEK MEDICAL CARE IF:   You have dizziness.  You have mild pelvic  cramps, pelvic pressure, or nagging pain in the abdominal area.  You have persistent nausea, vomiting, or diarrhea.  You have a bad smelling vaginal discharge.  You have pain with urination. SEEK IMMEDIATE MEDICAL CARE IF:   You have a fever.  You are leaking fluid from your vagina.  You have spotting or bleeding from your vagina.  You have severe abdominal cramping or pain.  You have rapid weight gain or loss.  You have shortness of breath with chest pain.  You notice sudden or extreme swelling of your face, hands, ankles, feet, or legs.  You have not felt your baby move in over an hour.  You have severe headaches that do not go away with medicine.  You have vision changes. Document Released: 11/17/2001 Document Revised: 07/26/2013 Document Reviewed: 01/24/2013 [facility] Patient Information 2014 [place], [place].  Breastfeeding Deciding to breastfeed is one of the best choices you can make for you and your baby. A change in hormones during pregnancy causes your breast tissue to grow and increases the number and size of your milk ducts. These hormones also allow proteins, sugars, and fats from your blood supply to make breast milk in your milk-producing glands. Hormones prevent breast milk from being released before your baby is born as well as prompt milk flow after birth. Once breastfeeding has begun, thoughts of your baby, as well as his or her sucking or crying, can stimulate the release of milk from your milk-producing glands.  BENEFITS OF BREASTFEEDING For Your Baby  Your first milk (colostrum) helps your baby's digestive system function  better.   There are antibodies in your milk that help your baby fight off infections.   Your baby has a lower incidence of asthma, allergies, and sudden infant death syndrome.   The nutrients in breast milk are better for your baby than infant formulas and are designed uniquely for your baby's needs.   Breast milk improves your baby's brain development.   Your baby is less likely to develop other conditions, such as childhood obesity, asthma, or type 2 diabetes mellitus.  For You   Breastfeeding helps to create a very special bond between you and your baby.   Breastfeeding is convenient. Breast milk is always available at the correct temperature and costs nothing.   Breastfeeding helps to burn calories and helps you lose the weight gained during pregnancy.   Breastfeeding makes your uterus contract to its prepregnancy size faster and slows bleeding (lochia) after you give birth.   Breastfeeding helps to lower your risk of developing type 2 diabetes mellitus, osteoporosis, and breast or ovarian cancer later in life. SIGNS THAT YOUR BABY IS HUNGRY Early Signs of Hunger  Increased alertness or activity.  Stretching.  Movement of the head from side to side.  Movement of the head and opening of the mouth when the corner of the mouth or cheek is stroked (rooting).  Increased sucking sounds, smacking lips, cooing, sighing, or squeaking.  Hand-to-mouth movements.  Increased sucking of fingers or hands. Late Signs of Hunger  Fussing.  Intermittent crying. Extreme Signs of Hunger Signs of extreme hunger will require calming and consoling before your baby will be able to breastfeed successfully. Do not wait for the following signs of extreme hunger to occur before you initiate breastfeeding:   Restlessness.  A loud, strong cry.   Screaming. BREASTFEEDING BASICS Breastfeeding Initiation  Find a comfortable place to sit or lie down, with your neck and back well  supported.  Place a  pillow or rolled up blanket under your baby to bring him or her to the level of your breast (if you are seated). Nursing pillows are specially designed to help support your arms and your baby while you breastfeed.  Make sure that your baby's abdomen is facing your abdomen.   Gently massage your breast. With your fingertips, massage from your chest wall toward your nipple in a circular motion. This encourages milk flow. You may need to continue this action during the feeding if your milk flows slowly.  Support your breast with 4 fingers underneath and your thumb above your nipple. Make sure your fingers are well away from your nipple and your baby's mouth.   Stroke your baby's lips gently with your finger or nipple.   When your baby's mouth is open wide enough, quickly bring your baby to your breast, placing your entire nipple and as much of the colored area around your nipple (areola) as possible into your baby's mouth.   More areola should be visible above your baby's upper lip than below the lower lip.   Your baby's tongue should be between his or her lower gum and your breast.   Ensure that your baby's mouth is correctly positioned around your nipple (latched). Your baby's lips should create a seal on your breast and be turned out (everted).  It is common for your baby to suck about 2 3 minutes in order to start the flow of breast milk. Latching Teaching your baby how to latch on to your breast properly is very important. An improper latch can cause nipple pain and decreased milk supply for you and poor weight gain in your baby. Also, if your baby is not latched onto your nipple properly, he or she may swallow some air during feeding. This can make your baby fussy. Burping your baby when you switch breasts during the feeding can help to get rid of the air. However, teaching your baby to latch on properly is still the best way to prevent fussiness from swallowing air  while breastfeeding. Signs that your baby has successfully latched on to your nipple:    Silent tugging or silent sucking, without causing you pain.   Swallowing heard between every 3 4 sucks.    Muscle movement above and in front of his or her ears while sucking.  Signs that your baby has not successfully latched on to nipple:   Sucking sounds or smacking sounds from your baby while breastfeeding.  Nipple pain. If you think your baby has not latched on correctly, slip your finger into the corner of your baby's mouth to break the suction and place it between your baby's gums. Attempt breastfeeding initiation again. Signs of Successful Breastfeeding Signs from your baby:   A gradual decrease in the number of sucks or complete cessation of sucking.   Falling asleep.   Relaxation of his or her body.   Retention of a small amount of milk in his or her mouth.   Letting go of your breast by himself or herself. Signs from you:  Breasts that have increased in firmness, weight, and size 1 3 hours after feeding.   Breasts that are softer immediately after breastfeeding.  Increased milk volume, as well as a change in milk consistency and color by the 5th day of breastfeeding.   Nipples that are not sore, cracked, or bleeding. Signs That Your Pecola Leisure is Getting Enough Milk  Wetting at least 3 diapers in a 24-hour period. The urine  should be clear and pale yellow by age 807 days.  At least 3 stools in a 24-hour period by age 807 days. The stool should be soft and yellow.  At least 3 stools in a 24-hour period by age 78 days. The stool should be seedy and yellow.  No loss of weight greater than 10% of birth weight during the first 22 days of age.  Average weight gain of 4 7 ounces (120 210 mL) per week after age 26 days.  Consistent daily weight gain by age 807 days, without weight loss after the age of 2 weeks. After a feeding, your baby may spit up a small amount. This is  common. BREASTFEEDING FREQUENCY AND DURATION Frequent feeding will help you make more milk and can prevent sore nipples and breast engorgement. Breastfeed when you feel the need to reduce the fullness of your breasts or when your baby shows signs of hunger. This is called "breastfeeding on demand." Avoid introducing a pacifier to your baby while you are working to establish breastfeeding (the first 4 6 weeks after your baby is born). After this time you may choose to use a pacifier. Research has shown that pacifier use during the first year of a baby's life decreases the risk of sudden infant death syndrome (SIDS). Allow your baby to feed on each breast as long as he or she wants. Breastfeed until your baby is finished feeding. When your baby unlatches or falls asleep while feeding from the first breast, offer the second breast. Because newborns are often sleepy in the first few weeks of life, you may need to awaken your baby to get him or her to feed. Breastfeeding times will vary from baby to baby. However, the following rules can serve as a guide to help you ensure that your baby is properly fed:  Newborns (babies 13 weeks of age or younger) may breastfeed every 1 3 hours.  Newborns should not go longer than 3 hours during the day or 5 hours during the night without breastfeeding.  You should breastfeed your baby a minimum of 8 times in a 24-hour period until you begin to introduce solid foods to your baby at around 84 months of age. BREAST MILK PUMPING Pumping and storing breast milk allows you to ensure that your baby is exclusively fed your breast milk, even at times when you are unable to breastfeed. This is especially important if you are going back to work while you are still breastfeeding or when you are not able to be present during feedings. Your lactation consultant can give you guidelines on how long it is safe to store breast milk.  A breast pump is a machine that allows you to pump milk  from your breast into a sterile bottle. The pumped breast milk can then be stored in a refrigerator or freezer. Some breast pumps are operated by hand, while others use electricity. Ask your lactation consultant which type will work best for you. Breast pumps can be purchased, but some hospitals and breastfeeding support groups lease breast pumps on a monthly basis. A lactation consultant can teach you how to hand express breast milk, if you prefer not to use a pump.  CARING FOR YOUR BREASTS WHILE YOU BREASTFEED Nipples can become dry, cracked, and sore while breastfeeding. The following recommendations can help keep your breasts moisturized and healthy:  Avoid using soap on your nipples.   Wear a supportive bra. Although not required, special nursing bras and tank tops are  designed to allow access to your breasts for breastfeeding without taking off your entire bra or top. Avoid wearing underwire style bras or extremely tight bras.  Air dry your nipples for 3 after each feeding.   Use only cotton bra pads to absorb leaked breast milk. Leaking of breast milk between feedings is normal.   Use lanolin on your nipples after breastfeeding. Lanolin helps to maintain your skin's normal moisture barrier. If you use pure lanolin you do not need to wash it off before feeding your baby again. Pure lanolin is not toxic to your baby. You may also hand express a few drops of breast milk and gently massage that milk into your nipples and allow the milk to air dry. In the first few weeks after giving birth, some women experience extremely full breasts (engorgement). Engorgement can make your breasts feel heavy, warm, and tender to the touch. Engorgement peaks within 3 5 days after you give birth. The following recommendations can help ease engorgement:  Completely empty your breasts while breastfeeding or pumping. You may want to start by applying warm, moist heat (in the shower or with warm  water-soaked hand towels) just before feeding or pumping. This increases circulation and helps the milk flow. If your baby does not completely empty your breasts while breastfeeding, pump any extra milk after he or she is finished.  Wear a snug bra (nursing or regular) or tank top for 1 2 days to signal your body to slightly decrease milk production.  Apply ice packs to your breasts, unless this is too uncomfortable for you.  Make sure that your baby is latched on and positioned properly while breastfeeding. If engorgement persists after 48 hours of following these recommendations, contact your health care provider or a Advertising copywriter. OVERALL HEALTH CARE RECOMMENDATIONS WHILE BREASTFEEDING  Eat healthy foods. Alternate between meals and snacks, eating 3 of each per day. Because what you eat affects your breast milk, some of the foods may make your baby more irritable than usual. Avoid eating these foods if you are sure that they are negatively affecting your baby.  Drink milk, fruit juice, and water to satisfy your thirst (about 10 glasses a day).   Rest often, relax, and continue to take your prenatal vitamins to prevent fatigue, stress, and anemia.  Continue breast self-awareness checks.  Avoid chewing and smoking tobacco.  Avoid alcohol and drug use. Some medicines that may be harmful to your baby can pass through breast milk. It is important to ask your health care provider before taking any medicine, including all over-the-counter and prescription medicine as well as vitamin and herbal supplements. It is possible to become pregnant while breastfeeding. If birth control is desired, ask your health care provider about options that will be safe for your baby. SEEK MEDICAL CARE IF:   You feel like you want to stop breastfeeding or have become frustrated with breastfeeding.  You have painful breasts or nipples.  Your nipples are cracked or bleeding.  Your breasts are red,  tender, or warm.  You have a swollen area on either breast.  You have a fever or chills.  You have nausea or vomiting.  You have drainage other than breast milk from your nipples.  Your breasts do not become full before feedings by the 5th day after you give birth.  You feel sad and depressed.  Your baby is too sleepy to eat well.  Your baby is having trouble sleeping.   Your baby is  wetting less than 3 diapers in a 24-hour period.  Your baby has less than 3 stools in a 24-hour period.  Your baby's skin or the white part of his or her eyes becomes yellow.   Your baby is not gaining weight by 54 days of age. SEEK IMMEDIATE MEDICAL CARE IF:   Your baby is overly tired (lethargic) and does not want to wake up and feed.  Your baby develops an unexplained fever. Document Released: 11/23/2005 Document Revised: 07/26/2013 Document Reviewed: 05/17/2013 [facility] Patient Information 2014 [place], [place].

## 2013-12-07 NOTE — L&D Delivery Note (Signed)
Delivery Note At 12:31 PM a healthy female was delivered via Vaginal, Spontaneous Delivery (Presentation: Left Occiput Anterior).  APGAR: 8, 9; weight .   Placenta status: Intact, Spontaneous.  Cord: 3 vessels with the following complications: None.  Cord pH: NA  Anesthesia: Epidural  Episiotomy: None Lacerations: Superficial periurethral - hemostatic Suture Repair: None Est. Blood Loss (250 mL):   Upon arrival patient was complete and pushing. She pushed with good maternal effort to deliver a healthy boy in meconium stained fluids. Baby delivered without difficulty after nuchal x 1 was reduced. NICU was present due to Fetal Echo showing RV dysfunction and dilation. Due to decreased fetal tone on delivery, umbilical cord was clamped and baby taken to warmer for evaluation. Placenta delivered intact with 3V cord. Pitocin was started and uterus massaged until bleeding slowed.  Vaginal canal and perineum was inspected and revealed superficial periurethral and superficial vaginal introitus lacerations which were all hemostatic and not requiring suturing. Counts of sharps, instruments, and lap pads were all correct.    Mom to postpartum.  Baby to Couplet care / Skin to Skin.  Wenda LowJoyner, James 02/26/2014, 12:50 PM   I was present for and supervised the delivery of this newborn. I agree with above documentation.   Kathaleya Mcduffee, Redmond BasemanKELI L, MD

## 2013-12-18 ENCOUNTER — Encounter (HOSPITAL_COMMUNITY): Payer: Self-pay | Admitting: *Deleted

## 2013-12-18 ENCOUNTER — Inpatient Hospital Stay (HOSPITAL_COMMUNITY)
Admission: AD | Admit: 2013-12-18 | Discharge: 2013-12-18 | Disposition: A | Discharge status: Home / Self Care | Payer: Medicaid Other | Source: Ambulatory Visit | Attending: Family Medicine | Admitting: Family Medicine

## 2013-12-18 ENCOUNTER — Encounter: Payer: Medicaid Other | Admitting: Obstetrics & Gynecology

## 2013-12-18 DIAGNOSIS — K529 Noninfective gastroenteritis and colitis, unspecified: Secondary | ICD-10-CM

## 2013-12-18 DIAGNOSIS — Z87891 Personal history of nicotine dependence: Secondary | ICD-10-CM | POA: Insufficient documentation

## 2013-12-18 DIAGNOSIS — R109 Unspecified abdominal pain: Secondary | ICD-10-CM | POA: Insufficient documentation

## 2013-12-18 DIAGNOSIS — K5289 Other specified noninfective gastroenteritis and colitis: Secondary | ICD-10-CM

## 2013-12-18 DIAGNOSIS — E119 Type 2 diabetes mellitus without complications: Secondary | ICD-10-CM | POA: Insufficient documentation

## 2013-12-18 DIAGNOSIS — O24919 Unspecified diabetes mellitus in pregnancy, unspecified trimester: Secondary | ICD-10-CM | POA: Insufficient documentation

## 2013-12-18 DIAGNOSIS — R197 Diarrhea, unspecified: Secondary | ICD-10-CM | POA: Insufficient documentation

## 2013-12-18 DIAGNOSIS — O212 Late vomiting of pregnancy: Secondary | ICD-10-CM | POA: Insufficient documentation

## 2013-12-18 DIAGNOSIS — Z794 Long term (current) use of insulin: Secondary | ICD-10-CM | POA: Insufficient documentation

## 2013-12-18 LAB — URINALYSIS, ROUTINE W REFLEX MICROSCOPIC
Bilirubin Urine: NEGATIVE
GLUCOSE, UA: NEGATIVE mg/dL
Hgb urine dipstick: NEGATIVE
Ketones, ur: 15 mg/dL — AB
Nitrite: NEGATIVE
Protein, ur: NEGATIVE mg/dL
Specific Gravity, Urine: >1.03 — ABNORMAL HIGH (ref 1.005–1.030)
Urobilinogen, UA: 1 mg/dL (ref 0.0–1.0)
pH: 6 (ref 5.0–8.0)

## 2013-12-18 LAB — URINE MICROSCOPIC-ADD ON

## 2013-12-18 MED ORDER — SODIUM CHLORIDE 0.9 % IV SOLN
8.0000 mg | Freq: Once | INTRAVENOUS | Status: AC
  Administered 2013-12-18: 8 mg via INTRAVENOUS
  Filled 2013-12-18: qty 4

## 2013-12-18 MED ORDER — ONDANSETRON 4 MG PO TBDP: 4.0000 mg | ORAL_TABLET | Freq: Four times a day (QID) | ORAL | Status: DC | PRN

## 2013-12-18 MED ORDER — LACTATED RINGERS IV BOLUS (SEPSIS)
1000.0000 mL | Freq: Once | INTRAVENOUS | Status: AC
  Administered 2013-12-18: 1000 mL via INTRAVENOUS

## 2013-12-18 MED ORDER — ONDANSETRON 4 MG PO TBDP
4.0000 mg | ORAL_TABLET | Freq: Once | ORAL | Status: AC
  Administered 2013-12-18: 4 mg via ORAL
  Filled 2013-12-18: qty 1

## 2013-12-18 NOTE — MAU Note (Signed)
Reports a lingering cough from a head cold a few weeks ago, denies fever and headache.  Says All these symptoms began this AM.

## 2013-12-18 NOTE — MAU Note (Signed)
Pt not in lobby when called

## 2013-12-18 NOTE — MAU Provider Note (Signed)
Chief Complaint:  No chief complaint on file.   Cindy Bruce is a 23 y.o.  [id number] with IUP at 7244w3d presenting for No chief complaint on file.  Pt states that acutely this AM she started having profuse vomiting and diarrhea at 1030 am.  Has persisted since then till around 2 hours ago. Now having severe nausea but no recent vomiting.  +abd cramps but feels like "something is moving through me". Hasn't been able to keep down any liquids all day.   +FM.  No LOF, VB, painful contractions.  No fevers, chills, chest pain, sob, dysuria, hematuria.   [facility] at [facility] and has appt later this week.  Complicated by DM on po medications. .    Menstrual History: OB History   Grav Para Term Preterm Abortions TAB SAB Ect Mult Living   3 2 2  0 0 0 0 0 0 2       No LMP recorded. Patient is pregnant.      Past Medical History  Diagnosis Date  . Asthma   . 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 Tooth extraction      Family History  Problem Relation Age of Onset  . Diabetes Mother   . Diabetes Maternal Grandmother     History  Substance Use Topics  . Smoking status: Former Smoker -- 0.25 packs/day    Types: Cigarettes  . Smokeless tobacco: Former NeurosurgeonUser    Quit date: 09/05/2012  . Alcohol Use: No     No Known Allergies  No prescriptions prior to admission    Review of Systems - Negative except for what is mentioned in HPI.  Physical Exam  Blood pressure 113/70, pulse 116, temperature 99.1 F (37.3 C), temperature source Oral, resp. rate 20, height 5\' 1"  (1.549 m), weight 77.622 kg (171 lb 2 oz), not currently breastfeeding. GENERAL: Well-developed, well-nourished female in no acute distress.  HEENT: dry mucous membranes.  LUNGS: Clear to auscultation bilaterally.  HEART: Regular rate and rhythm. ABDOMEN: Soft, nontender, nondistended, hyperactive bowel sounds,  gravid.  EXTREMITIES: Nontender, no  edema, 2+ distal pulses.  FHT:  Baseline rate 145 bpm   Variability moderate  Accelerations present   Decelerations none Contractions: none   Labs: Results for orders placed during the hospital encounter of 12/18/13 (from the past 24 hour(s))  URINALYSIS, ROUTINE W REFLEX MICROSCOPIC   Collection Time    12/18/13  2:00 PM      Result Value Range   Color, Urine YELLOW  YELLOW   APPearance CLEAR  CLEAR   Specific Gravity, Urine >1.030 (*) 1.005 - 1.030   pH 6.0  5.0 - 8.0   Glucose, UA NEGATIVE  NEGATIVE mg/dL   Hgb urine dipstick NEGATIVE  NEGATIVE   Bilirubin Urine NEGATIVE  NEGATIVE   Ketones, ur 15 (*) NEGATIVE mg/dL   Protein, ur NEGATIVE  NEGATIVE mg/dL   Urobilinogen, UA 1.0  0.0 - 1.0 mg/dL   Nitrite NEGATIVE  NEGATIVE   Leukocytes, UA MODERATE (*) NEGATIVE  URINE MICROSCOPIC-ADD ON   Collection Time    12/18/13  2:00 PM      Result Value Range   Squamous Epithelial / LPF MANY (*) RARE   WBC, UA 7-10  <3 WBC/hpf   Urine-Other FEW YEAST      Imaging Studies:  [place] Ob Follow Up  11/23/2013   OBSTETRICAL  ULTRASOUND: This exam was performed within a [facility]. The OB US report was generated in the [facility] system, and faxed to the ordering physician.   This report is also available in [employer] and in the [employer]. See [facility] Obstetric US report.   Assessment: Cindy Bruce is  23 y.o. G3P2002 at [redacted]w[redacted]d presents with nausea/vomiting/diarrhea.  .  Plan: 1) N/V/D - most consistent with viral gastroenteritis - acute onset and nwo already improving - given IVF here with improvement in symptoms and capillary refill.  - zofran x2 - able to tolerate po after antiemetics.   2) FWB - appropriate for GA  3) d/c to home  - return precautions given including worsening of abd pain, change in symptoms, inability to tolerate po.    Cindy Bruce L 1/12/20157:04 PM

## 2013-12-18 NOTE — Discharge Instructions (Signed)
Viral Gastroenteritis Viral gastroenteritis is also known as stomach flu. This condition affects the stomach and intestinal tract. It can cause sudden diarrhea and vomiting. The illness typically lasts 3 to 8 days. Most people develop an immune response that eventually gets rid of the virus. While this natural response develops, the virus can make you quite ill. CAUSES  Many different viruses can cause gastroenteritis, such as rotavirus or noroviruses. You can catch one of these viruses by consuming contaminated food or water. You may also catch a virus by sharing utensils or other personal items with an infected person or by touching a contaminated surface. SYMPTOMS  The most common symptoms are diarrhea and vomiting. These problems can cause a severe loss of body fluids (dehydration) and a body salt (electrolyte) imbalance. Other symptoms may include:  Fever.  Headache.  Fatigue.  Abdominal pain. DIAGNOSIS  Your caregiver can usually diagnose viral gastroenteritis based on your symptoms and a physical exam. A stool sample may also be taken to test for the presence of viruses or other infections. TREATMENT  This illness typically goes away on its own. Treatments are aimed at rehydration. The most serious cases of viral gastroenteritis involve vomiting so severely that you are not able to keep fluids down. In these cases, fluids must be given through an intravenous line (IV). HOME CARE INSTRUCTIONS   Drink enough fluids to keep your urine clear or pale yellow. Drink small amounts of fluids frequently and increase the amounts as tolerated.  Ask your caregiver for specific rehydration instructions.  Avoid:  Foods high in sugar.  Alcohol.  Carbonated drinks.  Tobacco.  Juice.  Caffeine drinks.  Extremely hot or cold fluids.  Fatty, greasy foods.  Too much intake of anything at one time.  Dairy products until 24 to 48 hours after diarrhea stops.  You may consume probiotics.  Probiotics are active cultures of beneficial bacteria. They may lessen the amount and number of diarrheal stools in adults. Probiotics can be found in yogurt with active cultures and in supplements.  Wash your hands well to avoid spreading the virus.  Only take over-the-counter or prescription medicines for pain, discomfort, or fever as directed by your caregiver. Do not give aspirin to children. Antidiarrheal medicines are not recommended.  Ask your caregiver if you should continue to take your regular prescribed and over-the-counter medicines.  Keep all follow-up appointments as directed by your caregiver. SEEK IMMEDIATE MEDICAL CARE IF:   You are unable to keep fluids down.  You do not urinate at least once every 6 to 8 hours.  You develop shortness of breath.  You notice blood in your stool or vomit. This may look like coffee grounds.  You have abdominal pain that increases or is concentrated in one small area (localized).  You have persistent vomiting or diarrhea.  You have a fever.  The patient is a child younger than 3 months, and he or she has a fever.  The patient is a child older than 3 months, and he or she has a fever and persistent symptoms.  The patient is a child older than 3 months, and he or she has a fever and symptoms suddenly get worse.  The patient is a baby, and he or she has no tears when crying. MAKE SURE YOU:   Understand these instructions.  Will watch your condition.  Will get help right away if you are not doing well or get worse. Document Released: 11/23/2005 Document Revised: 02/15/2012 Document Reviewed: 09/09/2011   [employer] Patient Information 2014 [employer].  

## 2013-12-18 NOTE — MAU Note (Signed)
Pt states here for n/v/d that began around 1030. Feels tired, weak, hungry and thirsty, however is unable to keep anything down. Has had milky white clumpy vaginal discharge with itching as well.

## 2013-12-19 NOTE — MAU Provider Note (Signed)
Attestation of Attending Supervision of Advanced Practitioner (PA/CNM/NP): Evaluation and management procedures were performed by the Advanced Practitioner under my supervision and collaboration.  I have reviewed the Advanced Practitioner's note and chart, and I agree with the management and plan.  Reva BoresPRATT,Koreen Lizaola S, MD [facility] Attending 12/19/2013 12:46 AM

## 2013-12-21 ENCOUNTER — Ambulatory Visit (HOSPITAL_COMMUNITY): Payer: Medicaid Other

## 2013-12-21 DIAGNOSIS — O35BXX0 Maternal care for other (suspected) fetal abnormality and damage, fetal cardiac anomalies, not applicable or unspecified: Secondary | ICD-10-CM | POA: Insufficient documentation

## 2013-12-25 ENCOUNTER — Encounter: Payer: Medicaid Other | Admitting: Family Medicine

## 2013-12-26 ENCOUNTER — Other Ambulatory Visit (HOSPITAL_COMMUNITY): Payer: Self-pay | Admitting: Obstetrics and Gynecology

## 2013-12-26 DIAGNOSIS — O24919 Unspecified diabetes mellitus in pregnancy, unspecified trimester: Secondary | ICD-10-CM

## 2013-12-28 ENCOUNTER — Encounter (HOSPITAL_COMMUNITY): Payer: Self-pay

## 2013-12-28 ENCOUNTER — Ambulatory Visit (HOSPITAL_COMMUNITY)
Admission: RE | Admit: 2013-12-28 | Discharge: 2013-12-28 | Disposition: A | Discharge status: Home / Self Care | Payer: Medicaid Other | Source: Ambulatory Visit | Attending: Obstetrics & Gynecology | Admitting: Obstetrics & Gynecology

## 2013-12-28 DIAGNOSIS — O24919 Unspecified diabetes mellitus in pregnancy, unspecified trimester: Secondary | ICD-10-CM

## 2013-12-28 DIAGNOSIS — O358XX0 Maternal care for other (suspected) fetal abnormality and damage, not applicable or unspecified: Secondary | ICD-10-CM | POA: Insufficient documentation

## 2014-01-01 ENCOUNTER — Ambulatory Visit (INDEPENDENT_AMBULATORY_CARE_PROVIDER_SITE_OTHER): Payer: Medicaid Other | Admitting: Family Medicine

## 2014-01-01 VITALS — BP 110/74 | Wt 169.3 lb

## 2014-01-01 DIAGNOSIS — O09899 Supervision of other high risk pregnancies, unspecified trimester: Secondary | ICD-10-CM

## 2014-01-01 DIAGNOSIS — O2340 Unspecified infection of urinary tract in pregnancy, unspecified trimester: Secondary | ICD-10-CM

## 2014-01-01 DIAGNOSIS — O26899 Other specified pregnancy related conditions, unspecified trimester: Secondary | ICD-10-CM

## 2014-01-01 DIAGNOSIS — B951 Streptococcus, group B, as the cause of diseases classified elsewhere: Secondary | ICD-10-CM

## 2014-01-01 DIAGNOSIS — Z6791 Unspecified blood type, Rh negative: Secondary | ICD-10-CM

## 2014-01-01 DIAGNOSIS — O2390 Unspecified genitourinary tract infection in pregnancy, unspecified trimester: Secondary | ICD-10-CM

## 2014-01-01 DIAGNOSIS — N390 Urinary tract infection, site not specified: Secondary | ICD-10-CM

## 2014-01-01 DIAGNOSIS — O24919 Unspecified diabetes mellitus in pregnancy, unspecified trimester: Secondary | ICD-10-CM

## 2014-01-01 DIAGNOSIS — O360990 Maternal care for other rhesus isoimmunization, unspecified trimester, not applicable or unspecified: Secondary | ICD-10-CM

## 2014-01-01 DIAGNOSIS — O0990 Supervision of high risk pregnancy, unspecified, unspecified trimester: Secondary | ICD-10-CM

## 2014-01-01 DIAGNOSIS — E119 Type 2 diabetes mellitus without complications: Secondary | ICD-10-CM

## 2014-01-01 LAB — WET PREP, GENITAL: Trich, Wet Prep: NONE SEEN

## 2014-01-01 LAB — RPR

## 2014-01-01 LAB — CBC
HCT: 29.3 % — ABNORMAL LOW (ref 36.0–46.0)
Hemoglobin: 9.9 g/dL — ABNORMAL LOW (ref 12.0–15.0)
MCH: 23.9 pg — ABNORMAL LOW (ref 26.0–34.0)
MCHC: 33.8 g/dL (ref 30.0–36.0)
MCV: 70.6 fL — ABNORMAL LOW (ref 78.0–100.0)
Platelets: 318 10*3/uL (ref 150–400)
RBC: 4.15 MIL/uL (ref 3.87–5.11)
RDW: 16 % — AB (ref 11.5–15.5)
WBC: 7.8 10*3/uL (ref 4.0–10.5)

## 2014-01-01 LAB — POCT URINALYSIS DIP (DEVICE)
BILIRUBIN URINE: NEGATIVE
Glucose, UA: NEGATIVE mg/dL
Hgb urine dipstick: NEGATIVE
Ketones, ur: 40 mg/dL — AB
Nitrite: NEGATIVE
PH: 6 (ref 5.0–8.0)
Protein, ur: 30 mg/dL — AB
Specific Gravity, Urine: 1.025 (ref 1.005–1.030)
Urobilinogen, UA: 1 mg/dL (ref 0.0–1.0)

## 2014-01-01 LAB — HIV ANTIBODY (ROUTINE TESTING W REFLEX): HIV: NONREACTIVE

## 2014-01-01 LAB — HEMOGLOBIN A1C
HEMOGLOBIN A1C: 7 % — AB (ref <5.7)
MEAN PLASMA GLUCOSE: 154 mg/dL — AB (ref <117)

## 2014-01-01 MED ORDER — RHO D IMMUNE GLOBULIN 1500 UNIT/2ML IJ SOLN
300.0000 ug | Freq: Once | INTRAMUSCULAR | Status: AC
  Administered 2014-01-01: 300 ug via INTRAMUSCULAR

## 2014-01-01 MED ORDER — GLYBURIDE 2.5 MG PO TABS: ORAL_TABLET | ORAL | Status: DC

## 2014-01-01 NOTE — Patient Instructions (Signed)
Third Trimester of Pregnancy  The third trimester is from week 29 through week 42, months 7 through 9. The third trimester is a time when the fetus is growing rapidly. At the end of the ninth month, the fetus is about 20 inches in length and weighs 6 10 pounds.   BODY CHANGES  Your body goes through many changes during pregnancy. The changes vary from woman to woman.    Your weight will continue to increase. You can expect to gain 25 35 pounds (11 16 kg) by the end of the pregnancy.   You may begin to get stretch marks on your hips, abdomen, and breasts.   You may urinate more often because the fetus is moving lower into your pelvis and pressing on your bladder.   You may develop or continue to have heartburn as a result of your pregnancy.   You may develop constipation because certain hormones are causing the muscles that push waste through your intestines to slow down.   You may develop hemorrhoids or swollen, bulging veins (varicose veins).   You may have pelvic pain because of the weight gain and pregnancy hormones relaxing your joints between the bones in your pelvis. Back aches may result from over exertion of the muscles supporting your posture.   Your breasts will continue to grow and be tender. A yellow discharge may leak from your breasts called colostrum.   Your belly button may stick out.   You may feel short of breath because of your expanding uterus.   You may notice the fetus "dropping," or moving lower in your abdomen.   You may have a bloody mucus discharge. This usually occurs a few days to a week before labor begins.   Your cervix becomes thin and soft (effaced) near your due date.  WHAT TO EXPECT AT YOUR PRENATAL EXAMS   You will have prenatal exams every 2 weeks until week 36. Then, you will have weekly prenatal exams. During a routine prenatal visit:   You will be weighed to make sure you and the fetus are growing normally.   Your blood pressure is taken.   Your abdomen will be  measured to track your baby's growth.   The fetal heartbeat will be listened to.   Any test results from the previous visit will be discussed.   You may have a cervical check near your due date to see if you have effaced.  At around 36 weeks, your caregiver will check your cervix. At the same time, your caregiver will also perform a test on the secretions of the vaginal tissue. This test is to determine if a type of bacteria, Group B streptococcus, is present. Your caregiver will explain this further.  Your caregiver may ask you:   What your birth plan is.   How you are feeling.   If you are feeling the baby move.   If you have had any abnormal symptoms, such as leaking fluid, bleeding, severe headaches, or abdominal cramping.   If you have any questions.  Other tests or screenings that may be performed during your third trimester include:   Blood tests that check for low iron levels (anemia).   Fetal testing to check the health, activity level, and growth of the fetus. Testing is done if you have certain medical conditions or if there are problems during the pregnancy.  FALSE LABOR  You may feel small, irregular contractions that eventually go away. These are called Braxton Hicks contractions, or   false labor. Contractions may last for hours, days, or even weeks before true labor sets in. If contractions come at regular intervals, intensify, or become painful, it is best to be seen by your caregiver.   SIGNS OF LABOR    Menstrual-like cramps.   Contractions that are 5 minutes apart or less.   Contractions that start on the top of the uterus and spread down to the lower abdomen and back.   A sense of increased pelvic pressure or back pain.   A watery or bloody mucus discharge that comes from the vagina.  If you have any of these signs before the 37th week of pregnancy, call your caregiver right away. You need to go to the hospital to get checked immediately.  HOME CARE INSTRUCTIONS    Avoid all  smoking, herbs, alcohol, and unprescribed drugs. These chemicals affect the formation and growth of the baby.   Follow your caregiver's instructions regarding medicine use. There are medicines that are either safe or unsafe to take during pregnancy.   Exercise only as directed by your caregiver. Experiencing uterine cramps is a good sign to stop exercising.   Continue to eat regular, healthy meals.   Wear a good support bra for breast tenderness.   Do not use hot tubs, steam rooms, or saunas.   Wear your seat belt at all times when driving.   Avoid raw meat, uncooked cheese, cat litter boxes, and soil used by cats. These carry germs that can cause birth defects in the baby.   Take your prenatal vitamins.   Try taking a stool softener (if your caregiver approves) if you develop constipation. Eat more high-fiber foods, such as fresh vegetables or fruit and whole grains. Drink plenty of fluids to keep your urine clear or pale yellow.   Take warm sitz baths to soothe any pain or discomfort caused by hemorrhoids. Use hemorrhoid cream if your caregiver approves.   If you develop varicose veins, wear support hose. Elevate your feet for 15 minutes, 3 4 times a day. Limit salt in your diet.   Avoid heavy lifting, wear low heal shoes, and practice good posture.   Rest a lot with your legs elevated if you have leg cramps or low back pain.   Visit your dentist if you have not gone during your pregnancy. Use a soft toothbrush to brush your teeth and be gentle when you floss.   A sexual relationship may be continued unless your caregiver directs you otherwise.   Do not travel far distances unless it is absolutely necessary and only with the approval of your caregiver.   Take prenatal classes to understand, practice, and ask questions about the labor and delivery.   Make a trial run to the hospital.   Pack your hospital bag.   Prepare the baby's nursery.   Continue to go to all your prenatal visits as directed  by your caregiver.  SEEK MEDICAL CARE IF:   You are unsure if you are in labor or if your water has broken.   You have dizziness.   You have mild pelvic cramps, pelvic pressure, or nagging pain in your abdominal area.   You have persistent nausea, vomiting, or diarrhea.   You have a bad smelling vaginal discharge.   You have pain with urination.  SEEK IMMEDIATE MEDICAL CARE IF:    You have a fever.   You are leaking fluid from your vagina.   You have spotting or bleeding from your vagina.     You have severe abdominal cramping or pain.   You have rapid weight loss or gain.   You have shortness of breath with chest pain.   You notice sudden or extreme swelling of your face, hands, ankles, feet, or legs.   You have not felt your baby move in over an hour.   You have severe headaches that do not go away with medicine.   You have vision changes.  Document Released: 11/17/2001 Document Revised: 07/26/2013 Document Reviewed: 01/24/2013  [employer] Patient Information 2014 [employer].

## 2014-01-01 NOTE — Progress Notes (Signed)
S: 23 yo G3P2002 @ 5755w3d hx of class B dm DM - CBG: fasting (98-132) none at goal - CBG pp: 115-190 (approx half higher than goal)  - cut out sodas. Drinks water and green tea - on metformin BID  O: see flowsheet  A/P DM- start glyburide 2.5mg  at dinner. Cont metformin. Will need to f/u in 1 week to ensure it is improving.  Vaginal discharge- treated for BV awhile ago, now thinks it is back. Wet prep done today PTL precautions discussed.  F/u in 1 week.

## 2014-01-01 NOTE — Progress Notes (Signed)
Pulse: 110 Pt concerned that she isn't gaining weight.  Thinks she has BV again.

## 2014-01-02 ENCOUNTER — Telehealth: Payer: Self-pay

## 2014-01-02 ENCOUNTER — Other Ambulatory Visit: Payer: Self-pay | Admitting: Family Medicine

## 2014-01-02 ENCOUNTER — Telehealth: Payer: Self-pay | Admitting: *Deleted

## 2014-01-02 DIAGNOSIS — B3731 Acute candidiasis of vulva and vagina: Secondary | ICD-10-CM

## 2014-01-02 DIAGNOSIS — B373 Candidiasis of vulva and vagina: Secondary | ICD-10-CM

## 2014-01-02 LAB — ANTIBODY SCREEN: Antibody Screen: NEGATIVE

## 2014-01-02 MED ORDER — FLUCONAZOLE 150 MG PO TABS: 150.0000 mg | ORAL_TABLET | Freq: Once | ORAL | Status: DC

## 2014-01-02 MED ORDER — METRONIDAZOLE 500 MG PO TABS: 500.0000 mg | ORAL_TABLET | Freq: Two times a day (BID) | ORAL | Status: DC

## 2014-01-02 NOTE — Telephone Encounter (Signed)
Called pt. To inform her of low hemoglobin and need to start iron. Informed her she can pick it up at any pharmacy by the vitamins. Advised her to take it once in the morning and once at night and that it may cause constipation. Advised her to take it with some orange juice and to ensure she is drinking plenty of water. Pt. Verbalized understanding. Pt. Then asked about her wet prep that had been done. Results show moderate yeast. Informed pt. I would prescribe Diflucan 150mg  tab to her pharmacy. Results show few clue cells-- Pt. States her discharge is thick and itchy with a slight odor. Advised pt. That her infection is more of a yeast infection this time, so I will only prescribe Diflucan but if discharge does not clear up to call and we can prescribe flagyl. Pt. Verbalized understanding and had no further questions or concerns.

## 2014-01-02 NOTE — Telephone Encounter (Addendum)
Message copied by Jill SideAY, DIANE L on Tue Jan 02, 2014 12:11 PM ------      Message from: Vale HavenBECK, KELI L      Created: Tue Jan 02, 2014 12:00 PM       Hi ladies. Please let the pt know that she had both yeast and BV on her wet prep. I sent two rx to the pharmacy for her.  Thanks!   ------  Called pt and informed her of test results being positive for yeast and BV.  [facility]  Pt voiced understanding and had no questions.

## 2014-01-02 NOTE — Telephone Encounter (Signed)
Message copied by Louanna RawAMPBELL, Ralf Konopka M on Tue Jan 02, 2014 11:36 AM ------      Message from: Vale HavenBECK, KELI L      Created: Mon Jan 01, 2014  8:30 PM       Hi ladies, can you call this pt and let her know that her hemoglobin is low and that she needs to start twice a day iron? Thanks! ------

## 2014-01-03 ENCOUNTER — Encounter: Payer: Self-pay | Admitting: *Deleted

## 2014-01-08 ENCOUNTER — Encounter: Payer: Medicaid Other | Admitting: Obstetrics & Gynecology

## 2014-01-15 ENCOUNTER — Ambulatory Visit (INDEPENDENT_AMBULATORY_CARE_PROVIDER_SITE_OTHER): Payer: Medicaid Other | Admitting: Family Medicine

## 2014-01-15 VITALS — BP 125/78 | Temp 97.7°F | Wt 177.1 lb

## 2014-01-15 DIAGNOSIS — O0990 Supervision of high risk pregnancy, unspecified, unspecified trimester: Secondary | ICD-10-CM

## 2014-01-15 DIAGNOSIS — Z6791 Unspecified blood type, Rh negative: Secondary | ICD-10-CM

## 2014-01-15 DIAGNOSIS — N390 Urinary tract infection, site not specified: Secondary | ICD-10-CM

## 2014-01-15 DIAGNOSIS — O2390 Unspecified genitourinary tract infection in pregnancy, unspecified trimester: Secondary | ICD-10-CM

## 2014-01-15 DIAGNOSIS — O360990 Maternal care for other rhesus isoimmunization, unspecified trimester, not applicable or unspecified: Secondary | ICD-10-CM

## 2014-01-15 DIAGNOSIS — O2340 Unspecified infection of urinary tract in pregnancy, unspecified trimester: Secondary | ICD-10-CM

## 2014-01-15 DIAGNOSIS — J45909 Unspecified asthma, uncomplicated: Secondary | ICD-10-CM

## 2014-01-15 DIAGNOSIS — E119 Type 2 diabetes mellitus without complications: Secondary | ICD-10-CM

## 2014-01-15 DIAGNOSIS — O26899 Other specified pregnancy related conditions, unspecified trimester: Secondary | ICD-10-CM

## 2014-01-15 DIAGNOSIS — O24919 Unspecified diabetes mellitus in pregnancy, unspecified trimester: Secondary | ICD-10-CM

## 2014-01-15 DIAGNOSIS — B951 Streptococcus, group B, as the cause of diseases classified elsewhere: Secondary | ICD-10-CM

## 2014-01-15 LAB — POCT URINALYSIS DIP (DEVICE)
BILIRUBIN URINE: NEGATIVE
Ketones, ur: 15 mg/dL — AB
Nitrite: NEGATIVE
PH: 5.5 (ref 5.0–8.0)
Protein, ur: NEGATIVE mg/dL
SPECIFIC GRAVITY, URINE: 1.02 (ref 1.005–1.030)
Urobilinogen, UA: 1 mg/dL (ref 0.0–1.0)

## 2014-01-15 LAB — GLUCOSE, CAPILLARY: Glucose-Capillary: 171 mg/dL — ABNORMAL HIGH (ref 70–99)

## 2014-01-15 MED ORDER — GLYBURIDE 2.5 MG PO TABS: 2.5000 mg | ORAL_TABLET | Freq: Two times a day (BID) | ORAL | Status: DC

## 2014-01-15 NOTE — Progress Notes (Signed)
Pulse- 120  Pain-cramping  Pressure-lower abd Pt reports lossing mucous plug last night

## 2014-01-15 NOTE — Progress Notes (Signed)
S: 23 yo G3P2002 @ 330w3d here for ROBV - states had some thick mucous last night - this AM has started having some cramping and pressure - +Fm - no LOF, VB.  DM- forgot book CBG in AM - around 90 per report PP- around 120-130  O: see flowsheet  A/P 1) cramping - will put on the monitor for NST as now 32 week class B DM - NST- cat I tracing.  Baseline 150 with accels to the 170s. No decels.  - no ctx on TOCO - cervix 0/thick/high - PTL precautions discussed.   2) DM - started on glyburide 2.5mg  at last visit.  Unclear if helping although per report seems to be helping fastings - random glucose checked today and 175 but after sweet tea at [employer] - start glybuirde BID as fasting seems improved.  - f/u in 1 week for recheck advised to bring book to NST and AFI appt.

## 2014-01-15 NOTE — Patient Instructions (Signed)
Third Trimester of Pregnancy  The third trimester is from week 29 through week 42, months 7 through 9. The third trimester is a time when the fetus is growing rapidly. At the end of the ninth month, the fetus is about 20 inches in length and weighs 6 10 pounds.   BODY CHANGES  Your body goes through many changes during pregnancy. The changes vary from woman to woman.    Your weight will continue to increase. You can expect to gain 25 35 pounds (11 16 kg) by the end of the pregnancy.   You may begin to get stretch marks on your hips, abdomen, and breasts.   You may urinate more often because the fetus is moving lower into your pelvis and pressing on your bladder.   You may develop or continue to have heartburn as a result of your pregnancy.   You may develop constipation because certain hormones are causing the muscles that push waste through your intestines to slow down.   You may develop hemorrhoids or swollen, bulging veins (varicose veins).   You may have pelvic pain because of the weight gain and pregnancy hormones relaxing your joints between the bones in your pelvis. Back aches may result from over exertion of the muscles supporting your posture.   Your breasts will continue to grow and be tender. A yellow discharge may leak from your breasts called colostrum.   Your belly button may stick out.   You may feel short of breath because of your expanding uterus.   You may notice the fetus "dropping," or moving lower in your abdomen.   You may have a bloody mucus discharge. This usually occurs a few days to a week before labor begins.   Your cervix becomes thin and soft (effaced) near your due date.  WHAT TO EXPECT AT YOUR PRENATAL EXAMS   You will have prenatal exams every 2 weeks until week 36. Then, you will have weekly prenatal exams. During a routine prenatal visit:   You will be weighed to make sure you and the fetus are growing normally.   Your blood pressure is taken.   Your abdomen will be  measured to track your baby's growth.   The fetal heartbeat will be listened to.   Any test results from the previous visit will be discussed.   You may have a cervical check near your due date to see if you have effaced.  At around 36 weeks, your caregiver will check your cervix. At the same time, your caregiver will also perform a test on the secretions of the vaginal tissue. This test is to determine if a type of bacteria, Group B streptococcus, is present. Your caregiver will explain this further.  Your caregiver may ask you:   What your birth plan is.   How you are feeling.   If you are feeling the baby move.   If you have had any abnormal symptoms, such as leaking fluid, bleeding, severe headaches, or abdominal cramping.   If you have any questions.  Other tests or screenings that may be performed during your third trimester include:   Blood tests that check for low iron levels (anemia).   Fetal testing to check the health, activity level, and growth of the fetus. Testing is done if you have certain medical conditions or if there are problems during the pregnancy.  FALSE LABOR  You may feel small, irregular contractions that eventually go away. These are called Braxton Hicks contractions, or   false labor. Contractions may last for hours, days, or even weeks before true labor sets in. If contractions come at regular intervals, intensify, or become painful, it is best to be seen by your caregiver.   SIGNS OF LABOR    Menstrual-like cramps.   Contractions that are 5 minutes apart or less.   Contractions that start on the top of the uterus and spread down to the lower abdomen and back.   A sense of increased pelvic pressure or back pain.   A watery or bloody mucus discharge that comes from the vagina.  If you have any of these signs before the 37th week of pregnancy, call your caregiver right away. You need to go to the hospital to get checked immediately.  HOME CARE INSTRUCTIONS    Avoid all  smoking, herbs, alcohol, and unprescribed drugs. These chemicals affect the formation and growth of the baby.   Follow your caregiver's instructions regarding medicine use. There are medicines that are either safe or unsafe to take during pregnancy.   Exercise only as directed by your caregiver. Experiencing uterine cramps is a good sign to stop exercising.   Continue to eat regular, healthy meals.   Wear a good support bra for breast tenderness.   Do not use hot tubs, steam rooms, or saunas.   Wear your seat belt at all times when driving.   Avoid raw meat, uncooked cheese, cat litter boxes, and soil used by cats. These carry germs that can cause birth defects in the baby.   Take your prenatal vitamins.   Try taking a stool softener (if your caregiver approves) if you develop constipation. Eat more high-fiber foods, such as fresh vegetables or fruit and whole grains. Drink plenty of fluids to keep your urine clear or pale yellow.   Take warm sitz baths to soothe any pain or discomfort caused by hemorrhoids. Use hemorrhoid cream if your caregiver approves.   If you develop varicose veins, wear support hose. Elevate your feet for 15 minutes, 3 4 times a day. Limit salt in your diet.   Avoid heavy lifting, wear low heal shoes, and practice good posture.   Rest a lot with your legs elevated if you have leg cramps or low back pain.   Visit your dentist if you have not gone during your pregnancy. Use a soft toothbrush to brush your teeth and be gentle when you floss.   A sexual relationship may be continued unless your caregiver directs you otherwise.   Do not travel far distances unless it is absolutely necessary and only with the approval of your caregiver.   Take prenatal classes to understand, practice, and ask questions about the labor and delivery.   Make a trial run to the hospital.   Pack your hospital bag.   Prepare the baby's nursery.   Continue to go to all your prenatal visits as directed  by your caregiver.  SEEK MEDICAL CARE IF:   You are unsure if you are in labor or if your water has broken.   You have dizziness.   You have mild pelvic cramps, pelvic pressure, or nagging pain in your abdominal area.   You have persistent nausea, vomiting, or diarrhea.   You have a bad smelling vaginal discharge.   You have pain with urination.  SEEK IMMEDIATE MEDICAL CARE IF:    You have a fever.   You are leaking fluid from your vagina.   You have spotting or bleeding from your vagina.     You have severe abdominal cramping or pain.   You have rapid weight loss or gain.   You have shortness of breath with chest pain.   You notice sudden or extreme swelling of your face, hands, ankles, feet, or legs.   You have not felt your baby move in over an hour.   You have severe headaches that do not go away with medicine.   You have vision changes.  Document Released: 11/17/2001 Document Revised: 07/26/2013 Document Reviewed: 01/24/2013  [employer] Patient Information 2014 [employer].

## 2014-01-18 ENCOUNTER — Ambulatory Visit (INDEPENDENT_AMBULATORY_CARE_PROVIDER_SITE_OTHER): Payer: Medicaid Other | Admitting: *Deleted

## 2014-01-18 DIAGNOSIS — O24919 Unspecified diabetes mellitus in pregnancy, unspecified trimester: Secondary | ICD-10-CM

## 2014-01-18 DIAGNOSIS — E119 Type 2 diabetes mellitus without complications: Secondary | ICD-10-CM

## 2014-01-18 LAB — US OB FOLLOW UP

## 2014-01-18 NOTE — Progress Notes (Signed)
NST performed today was reviewed and was found to be reactive. AFI normal at 15.8 cm. Continue recommended antenatal testing and prenatal care.

## 2014-01-22 ENCOUNTER — Other Ambulatory Visit: Payer: Medicaid Other

## 2014-01-23 ENCOUNTER — Other Ambulatory Visit: Payer: Self-pay | Admitting: Family Medicine

## 2014-01-23 DIAGNOSIS — O24919 Unspecified diabetes mellitus in pregnancy, unspecified trimester: Secondary | ICD-10-CM

## 2014-01-25 ENCOUNTER — Ambulatory Visit (HOSPITAL_COMMUNITY)
Admission: RE | Admit: 2014-01-25 | Discharge: 2014-01-25 | Disposition: A | Discharge status: Home / Self Care | Payer: Medicaid Other | Source: Ambulatory Visit | Attending: Obstetrics & Gynecology | Admitting: Obstetrics & Gynecology

## 2014-01-25 ENCOUNTER — Ambulatory Visit (HOSPITAL_COMMUNITY)
Admission: RE | Admit: 2014-01-25 | Discharge: 2014-01-25 | Disposition: A | Discharge status: Home / Self Care | Payer: Medicaid Other | Source: Ambulatory Visit | Attending: Family Medicine | Admitting: Family Medicine

## 2014-01-25 DIAGNOSIS — O24919 Unspecified diabetes mellitus in pregnancy, unspecified trimester: Secondary | ICD-10-CM | POA: Insufficient documentation

## 2014-01-25 DIAGNOSIS — O358XX0 Maternal care for other (suspected) fetal abnormality and damage, not applicable or unspecified: Secondary | ICD-10-CM | POA: Insufficient documentation

## 2014-01-29 ENCOUNTER — Other Ambulatory Visit: Payer: Medicaid Other

## 2014-01-30 ENCOUNTER — Other Ambulatory Visit: Payer: Medicaid Other

## 2014-02-01 ENCOUNTER — Telehealth: Payer: Self-pay | Admitting: *Deleted

## 2014-02-01 ENCOUNTER — Other Ambulatory Visit: Payer: Medicaid Other

## 2014-02-01 NOTE — Telephone Encounter (Signed)
Called pt and informed her that the clinic is closing for this afternoon and her appt @ 1400 is being cancelled.  We will also be closed tomorrow. Pt confirmed good FM. She was advised to go to Maternity Admissions if she should notice decreased FM or any pregnancy problem. She should keep her next appt on 3/2 @ 1030 as scheduled.  Pt voiced understanding.

## 2014-02-05 ENCOUNTER — Encounter: Payer: Self-pay | Admitting: *Deleted

## 2014-02-05 ENCOUNTER — Ambulatory Visit (INDEPENDENT_AMBULATORY_CARE_PROVIDER_SITE_OTHER): Payer: Medicaid Other | Admitting: Obstetrics & Gynecology

## 2014-02-05 VITALS — BP 125/78 | Temp 98.6°F | Wt 180.3 lb

## 2014-02-05 DIAGNOSIS — O0990 Supervision of high risk pregnancy, unspecified, unspecified trimester: Secondary | ICD-10-CM

## 2014-02-05 DIAGNOSIS — Z23 Encounter for immunization: Secondary | ICD-10-CM

## 2014-02-05 DIAGNOSIS — Z6791 Unspecified blood type, Rh negative: Secondary | ICD-10-CM

## 2014-02-05 DIAGNOSIS — O360990 Maternal care for other rhesus isoimmunization, unspecified trimester, not applicable or unspecified: Secondary | ICD-10-CM

## 2014-02-05 DIAGNOSIS — E119 Type 2 diabetes mellitus without complications: Secondary | ICD-10-CM

## 2014-02-05 DIAGNOSIS — O24919 Unspecified diabetes mellitus in pregnancy, unspecified trimester: Secondary | ICD-10-CM

## 2014-02-05 DIAGNOSIS — O26899 Other specified pregnancy related conditions, unspecified trimester: Secondary | ICD-10-CM

## 2014-02-05 LAB — POCT URINALYSIS DIP (DEVICE)
BILIRUBIN URINE: NEGATIVE
Glucose, UA: 250 mg/dL — AB
Hgb urine dipstick: NEGATIVE
KETONES UR: NEGATIVE mg/dL
Nitrite: NEGATIVE
PH: 5.5 (ref 5.0–8.0)
Protein, ur: NEGATIVE mg/dL
SPECIFIC GRAVITY, URINE: 1.02 (ref 1.005–1.030)
Urobilinogen, UA: 2 mg/dL — ABNORMAL HIGH (ref 0.0–1.0)

## 2014-02-05 MED ORDER — TETANUS-DIPHTH-ACELL PERTUSSIS 5-2.5-18.5 LF-MCG/0.5 IM SUSP: 0.5000 mL | Freq: Once | INTRAMUSCULAR | Status: DC

## 2014-02-05 NOTE — Progress Notes (Signed)
P=107, c/o contractions every day all day about 7 minutes or longer apart.  C/o back pain.

## 2014-02-05 NOTE — Patient Instructions (Signed)
Third Trimester of Pregnancy  The third trimester is from week 29 through week 42, months 7 through 9. The third trimester is a time when the fetus is growing rapidly. At the end of the ninth month, the fetus is about 20 inches in length and weighs 6 10 pounds.   BODY CHANGES  Your body goes through many changes during pregnancy. The changes vary from woman to woman.    Your weight will continue to increase. You can expect to gain 25 35 pounds (11 16 kg) by the end of the pregnancy.   You may begin to get stretch marks on your hips, abdomen, and breasts.   You may urinate more often because the fetus is moving lower into your pelvis and pressing on your bladder.   You may develop or continue to have heartburn as a result of your pregnancy.   You may develop constipation because certain hormones are causing the muscles that push waste through your intestines to slow down.   You may develop hemorrhoids or swollen, bulging veins (varicose veins).   You may have pelvic pain because of the weight gain and pregnancy hormones relaxing your joints between the bones in your pelvis. Back aches may result from over exertion of the muscles supporting your posture.   Your breasts will continue to grow and be tender. A yellow discharge may leak from your breasts called colostrum.   Your belly button may stick out.   You may feel short of breath because of your expanding uterus.   You may notice the fetus "dropping," or moving lower in your abdomen.   You may have a bloody mucus discharge. This usually occurs a few days to a week before labor begins.   Your cervix becomes thin and soft (effaced) near your due date.  WHAT TO EXPECT AT YOUR PRENATAL EXAMS   You will have prenatal exams every 2 weeks until week 36. Then, you will have weekly prenatal exams. During a routine prenatal visit:   You will be weighed to make sure you and the fetus are growing normally.   Your blood pressure is taken.   Your abdomen will be  measured to track your baby's growth.   The fetal heartbeat will be listened to.   Any test results from the previous visit will be discussed.   You may have a cervical check near your due date to see if you have effaced.  At around 36 weeks, your caregiver will check your cervix. At the same time, your caregiver will also perform a test on the secretions of the vaginal tissue. This test is to determine if a type of bacteria, Group B streptococcus, is present. Your caregiver will explain this further.  Your caregiver may ask you:   What your birth plan is.   How you are feeling.   If you are feeling the baby move.   If you have had any abnormal symptoms, such as leaking fluid, bleeding, severe headaches, or abdominal cramping.   If you have any questions.  Other tests or screenings that may be performed during your third trimester include:   Blood tests that check for low iron levels (anemia).   Fetal testing to check the health, activity level, and growth of the fetus. Testing is done if you have certain medical conditions or if there are problems during the pregnancy.  FALSE LABOR  You may feel small, irregular contractions that eventually go away. These are called Braxton Hicks contractions, or   false labor. Contractions may last for hours, days, or even weeks before true labor sets in. If contractions come at regular intervals, intensify, or become painful, it is best to be seen by your caregiver.   SIGNS OF LABOR    Menstrual-like cramps.   Contractions that are 5 minutes apart or less.   Contractions that start on the top of the uterus and spread down to the lower abdomen and back.   A sense of increased pelvic pressure or back pain.   A watery or bloody mucus discharge that comes from the vagina.  If you have any of these signs before the 37th week of pregnancy, call your caregiver right away. You need to go to the hospital to get checked immediately.  HOME CARE INSTRUCTIONS    Avoid all  smoking, herbs, alcohol, and unprescribed drugs. These chemicals affect the formation and growth of the baby.   Follow your caregiver's instructions regarding medicine use. There are medicines that are either safe or unsafe to take during pregnancy.   Exercise only as directed by your caregiver. Experiencing uterine cramps is a good sign to stop exercising.   Continue to eat regular, healthy meals.   Wear a good support bra for breast tenderness.   Do not use hot tubs, steam rooms, or saunas.   Wear your seat belt at all times when driving.   Avoid raw meat, uncooked cheese, cat litter boxes, and soil used by cats. These carry germs that can cause birth defects in the baby.   Take your prenatal vitamins.   Try taking a stool softener (if your caregiver approves) if you develop constipation. Eat more high-fiber foods, such as fresh vegetables or fruit and whole grains. Drink plenty of fluids to keep your urine clear or pale yellow.   Take warm sitz baths to soothe any pain or discomfort caused by hemorrhoids. Use hemorrhoid cream if your caregiver approves.   If you develop varicose veins, wear support hose. Elevate your feet for 15 minutes, 3 4 times a day. Limit salt in your diet.   Avoid heavy lifting, wear low heal shoes, and practice good posture.   Rest a lot with your legs elevated if you have leg cramps or low back pain.   Visit your dentist if you have not gone during your pregnancy. Use a soft toothbrush to brush your teeth and be gentle when you floss.   A sexual relationship may be continued unless your caregiver directs you otherwise.   Do not travel far distances unless it is absolutely necessary and only with the approval of your caregiver.   Take prenatal classes to understand, practice, and ask questions about the labor and delivery.   Make a trial run to the hospital.   Pack your hospital bag.   Prepare the baby's nursery.   Continue to go to all your prenatal visits as directed  by your caregiver.  SEEK MEDICAL CARE IF:   You are unsure if you are in labor or if your water has broken.   You have dizziness.   You have mild pelvic cramps, pelvic pressure, or nagging pain in your abdominal area.   You have persistent nausea, vomiting, or diarrhea.   You have a bad smelling vaginal discharge.   You have pain with urination.  SEEK IMMEDIATE MEDICAL CARE IF:    You have a fever.   You are leaking fluid from your vagina.   You have spotting or bleeding from your vagina.     You have severe abdominal cramping or pain.   You have rapid weight loss or gain.   You have shortness of breath with chest pain.   You notice sudden or extreme swelling of your face, hands, ankles, feet, or legs.   You have not felt your baby move in over an hour.   You have severe headaches that do not go away with medicine.   You have vision changes.  Document Released: 11/17/2001 Document Revised: 07/26/2013 Document Reviewed: 01/24/2013  [employer] Patient Information 2014 [employer].

## 2014-02-05 NOTE — Progress Notes (Signed)
NST reactive today. FBS <92, PP <127, most<120,no change in medications

## 2014-02-08 ENCOUNTER — Ambulatory Visit (INDEPENDENT_AMBULATORY_CARE_PROVIDER_SITE_OTHER): Payer: Medicaid Other | Admitting: *Deleted

## 2014-02-08 VITALS — BP 119/70

## 2014-02-08 DIAGNOSIS — O24919 Unspecified diabetes mellitus in pregnancy, unspecified trimester: Secondary | ICD-10-CM

## 2014-02-08 DIAGNOSIS — E119 Type 2 diabetes mellitus without complications: Secondary | ICD-10-CM

## 2014-02-08 NOTE — Progress Notes (Signed)
P - 97 

## 2014-02-12 ENCOUNTER — Encounter: Payer: Self-pay | Admitting: Family Medicine

## 2014-02-12 ENCOUNTER — Ambulatory Visit (INDEPENDENT_AMBULATORY_CARE_PROVIDER_SITE_OTHER): Payer: Medicaid Other | Admitting: Family Medicine

## 2014-02-12 VITALS — BP 119/78 | Temp 99.0°F | Wt 178.6 lb

## 2014-02-12 DIAGNOSIS — E119 Type 2 diabetes mellitus without complications: Secondary | ICD-10-CM

## 2014-02-12 DIAGNOSIS — O2340 Unspecified infection of urinary tract in pregnancy, unspecified trimester: Secondary | ICD-10-CM

## 2014-02-12 DIAGNOSIS — B951 Streptococcus, group B, as the cause of diseases classified elsewhere: Secondary | ICD-10-CM

## 2014-02-12 DIAGNOSIS — O24919 Unspecified diabetes mellitus in pregnancy, unspecified trimester: Secondary | ICD-10-CM

## 2014-02-12 DIAGNOSIS — O09899 Supervision of other high risk pregnancies, unspecified trimester: Secondary | ICD-10-CM

## 2014-02-12 DIAGNOSIS — O0990 Supervision of high risk pregnancy, unspecified, unspecified trimester: Secondary | ICD-10-CM

## 2014-02-12 DIAGNOSIS — N390 Urinary tract infection, site not specified: Secondary | ICD-10-CM

## 2014-02-12 DIAGNOSIS — O2390 Unspecified genitourinary tract infection in pregnancy, unspecified trimester: Secondary | ICD-10-CM

## 2014-02-12 LAB — POCT URINALYSIS DIP (DEVICE)
Bilirubin Urine: NEGATIVE
GLUCOSE, UA: 100 mg/dL — AB
HGB URINE DIPSTICK: NEGATIVE
KETONES UR: NEGATIVE mg/dL
Nitrite: NEGATIVE
Protein, ur: 30 mg/dL — AB
Urobilinogen, UA: 1 mg/dL (ref 0.0–1.0)
pH: 5.5 (ref 5.0–8.0)

## 2014-02-12 NOTE — Progress Notes (Signed)
Pulse- 114 Patient reports pelvic/vaginal pain & pressure; also reporting some braxton hicks contractions

## 2014-02-12 NOTE — Patient Instructions (Signed)
Third Trimester of Pregnancy  The third trimester is from week 29 through week 42, months 7 through 9. The third trimester is a time when the fetus is growing rapidly. At the end of the ninth month, the fetus is about 20 inches in length and weighs 6 10 pounds.   BODY CHANGES  Your body goes through many changes during pregnancy. The changes vary from woman to woman.    Your weight will continue to increase. You can expect to gain 25 35 pounds (11 16 kg) by the end of the pregnancy.   You may begin to get stretch marks on your hips, abdomen, and breasts.   You may urinate more often because the fetus is moving lower into your pelvis and pressing on your bladder.   You may develop or continue to have heartburn as a result of your pregnancy.   You may develop constipation because certain hormones are causing the muscles that push waste through your intestines to slow down.   You may develop hemorrhoids or swollen, bulging veins (varicose veins).   You may have pelvic pain because of the weight gain and pregnancy hormones relaxing your joints between the bones in your pelvis. Back aches may result from over exertion of the muscles supporting your posture.   Your breasts will continue to grow and be tender. A yellow discharge may leak from your breasts called colostrum.   Your belly button may stick out.   You may feel short of breath because of your expanding uterus.   You may notice the fetus "dropping," or moving lower in your abdomen.   You may have a bloody mucus discharge. This usually occurs a few days to a week before labor begins.   Your cervix becomes thin and soft (effaced) near your due date.  WHAT TO EXPECT AT YOUR PRENATAL EXAMS   You will have prenatal exams every 2 weeks until week 36. Then, you will have weekly prenatal exams. During a routine prenatal visit:   You will be weighed to make sure you and the fetus are growing normally.   Your blood pressure is taken.   Your abdomen will be  measured to track your baby's growth.   The fetal heartbeat will be listened to.   Any test results from the previous visit will be discussed.   You may have a cervical check near your due date to see if you have effaced.  At around 36 weeks, your caregiver will check your cervix. At the same time, your caregiver will also perform a test on the secretions of the vaginal tissue. This test is to determine if a type of bacteria, Group B streptococcus, is present. Your caregiver will explain this further.  Your caregiver may ask you:   What your birth plan is.   How you are feeling.   If you are feeling the baby move.   If you have had any abnormal symptoms, such as leaking fluid, bleeding, severe headaches, or abdominal cramping.   If you have any questions.  Other tests or screenings that may be performed during your third trimester include:   Blood tests that check for low iron levels (anemia).   Fetal testing to check the health, activity level, and growth of the fetus. Testing is done if you have certain medical conditions or if there are problems during the pregnancy.  FALSE LABOR  You may feel small, irregular contractions that eventually go away. These are called Braxton Hicks contractions, or   false labor. Contractions may last for hours, days, or even weeks before true labor sets in. If contractions come at regular intervals, intensify, or become painful, it is best to be seen by your caregiver.   SIGNS OF LABOR    Menstrual-like cramps.   Contractions that are 5 minutes apart or less.   Contractions that start on the top of the uterus and spread down to the lower abdomen and back.   A sense of increased pelvic pressure or back pain.   A watery or bloody mucus discharge that comes from the vagina.  If you have any of these signs before the 37th week of pregnancy, call your caregiver right away. You need to go to the hospital to get checked immediately.  HOME CARE INSTRUCTIONS    Avoid all  smoking, herbs, alcohol, and unprescribed drugs. These chemicals affect the formation and growth of the baby.   Follow your caregiver's instructions regarding medicine use. There are medicines that are either safe or unsafe to take during pregnancy.   Exercise only as directed by your caregiver. Experiencing uterine cramps is a good sign to stop exercising.   Continue to eat regular, healthy meals.   Wear a good support bra for breast tenderness.   Do not use hot tubs, steam rooms, or saunas.   Wear your seat belt at all times when driving.   Avoid raw meat, uncooked cheese, cat litter boxes, and soil used by cats. These carry germs that can cause birth defects in the baby.   Take your prenatal vitamins.   Try taking a stool softener (if your caregiver approves) if you develop constipation. Eat more high-fiber foods, such as fresh vegetables or fruit and whole grains. Drink plenty of fluids to keep your urine clear or pale yellow.   Take warm sitz baths to soothe any pain or discomfort caused by hemorrhoids. Use hemorrhoid cream if your caregiver approves.   If you develop varicose veins, wear support hose. Elevate your feet for 15 minutes, 3 4 times a day. Limit salt in your diet.   Avoid heavy lifting, wear low heal shoes, and practice good posture.   Rest a lot with your legs elevated if you have leg cramps or low back pain.   Visit your dentist if you have not gone during your pregnancy. Use a soft toothbrush to brush your teeth and be gentle when you floss.   A sexual relationship may be continued unless your caregiver directs you otherwise.   Do not travel far distances unless it is absolutely necessary and only with the approval of your caregiver.   Take prenatal classes to understand, practice, and ask questions about the labor and delivery.   Make a trial run to the hospital.   Pack your hospital bag.   Prepare the baby's nursery.   Continue to go to all your prenatal visits as directed  by your caregiver.  SEEK MEDICAL CARE IF:   You are unsure if you are in labor or if your water has broken.   You have dizziness.   You have mild pelvic cramps, pelvic pressure, or nagging pain in your abdominal area.   You have persistent nausea, vomiting, or diarrhea.   You have a bad smelling vaginal discharge.   You have pain with urination.  SEEK IMMEDIATE MEDICAL CARE IF:    You have a fever.   You are leaking fluid from your vagina.   You have spotting or bleeding from your vagina.     You have severe abdominal cramping or pain.   You have rapid weight loss or gain.   You have shortness of breath with chest pain.   You notice sudden or extreme swelling of your face, hands, ankles, feet, or legs.   You have not felt your baby move in over an hour.   You have severe headaches that do not go away with medicine.   You have vision changes.  Document Released: 11/17/2001 Document Revised: 07/26/2013 Document Reviewed: 01/24/2013  [employer] Patient Information 2014 [employer].

## 2014-02-12 NOTE — Progress Notes (Signed)
S: 23 yo G3P2002 @[redacted]w[redacted]d  here for ROBV  DM- class B - fasting <90 - pp <130 Taking glyburide BID and doing much better with it.   Irregular contractions. +FM. No lof, vb   O: see flowsheet  A/P 1)DM - class b. Controlled on BID glyburide and metformin.  - no changes today - US ordered for 38 weeks  2) NST - cat I tracing. Baseline 145, mod var, +accels.  - cont twice weekly testing.   36 week gc/chl today. GBS already positive. Not repeated.

## 2014-02-13 LAB — GC/CHLAMYDIA PROBE AMP
CT PROBE, AMP APTIMA: NEGATIVE
GC Probe RNA: NEGATIVE

## 2014-02-13 NOTE — Progress Notes (Signed)
NST 02/08/14 reactive

## 2014-02-14 ENCOUNTER — Encounter (HOSPITAL_COMMUNITY): Payer: Self-pay | Admitting: Emergency Medicine

## 2014-02-14 ENCOUNTER — Inpatient Hospital Stay (HOSPITAL_COMMUNITY)
Admission: EM | Admit: 2014-02-14 | Discharge: 2014-02-17 | DRG: 781 | Disposition: A | Discharge status: Home / Self Care | Payer: Medicaid Other | Attending: Obstetrics & Gynecology | Admitting: Obstetrics & Gynecology

## 2014-02-14 ENCOUNTER — Emergency Department (HOSPITAL_COMMUNITY): Payer: Medicaid Other

## 2014-02-14 DIAGNOSIS — O2340 Unspecified infection of urinary tract in pregnancy, unspecified trimester: Secondary | ICD-10-CM

## 2014-02-14 DIAGNOSIS — L03213 Periorbital cellulitis: Secondary | ICD-10-CM | POA: Diagnosis present

## 2014-02-14 DIAGNOSIS — O24919 Unspecified diabetes mellitus in pregnancy, unspecified trimester: Secondary | ICD-10-CM

## 2014-02-14 DIAGNOSIS — B951 Streptococcus, group B, as the cause of diseases classified elsewhere: Secondary | ICD-10-CM

## 2014-02-14 DIAGNOSIS — L03211 Cellulitis of face: Secondary | ICD-10-CM | POA: Diagnosis present

## 2014-02-14 DIAGNOSIS — Z87891 Personal history of nicotine dependence: Secondary | ICD-10-CM

## 2014-02-14 DIAGNOSIS — O09899 Supervision of other high risk pregnancies, unspecified trimester: Secondary | ICD-10-CM

## 2014-02-14 DIAGNOSIS — O99891 Other specified diseases and conditions complicating pregnancy: Principal | ICD-10-CM | POA: Diagnosis present

## 2014-02-14 DIAGNOSIS — Z794 Long term (current) use of insulin: Secondary | ICD-10-CM

## 2014-02-14 DIAGNOSIS — K029 Dental caries, unspecified: Secondary | ICD-10-CM | POA: Diagnosis present

## 2014-02-14 DIAGNOSIS — O9989 Other specified diseases and conditions complicating pregnancy, childbirth and the puerperium: Principal | ICD-10-CM

## 2014-02-14 DIAGNOSIS — E119 Type 2 diabetes mellitus without complications: Secondary | ICD-10-CM | POA: Diagnosis present

## 2014-02-14 DIAGNOSIS — A4901 Methicillin susceptible Staphylococcus aureus infection, unspecified site: Secondary | ICD-10-CM

## 2014-02-14 DIAGNOSIS — L0201 Cutaneous abscess of face: Secondary | ICD-10-CM

## 2014-02-14 DIAGNOSIS — O0990 Supervision of high risk pregnancy, unspecified, unspecified trimester: Secondary | ICD-10-CM

## 2014-02-14 LAB — BASIC METABOLIC PANEL
BUN: 6 mg/dL (ref 6–23)
CO2: 17 mEq/L — ABNORMAL LOW (ref 19–32)
Calcium: 8.9 mg/dL (ref 8.4–10.5)
Chloride: 100 mEq/L (ref 96–112)
Creatinine, Ser: 0.51 mg/dL (ref 0.50–1.10)
GFR calc Af Amer: >90 mL/min (ref >90)
GFR calc non Af Amer: >90 mL/min (ref >90)
Glucose, Bld: 87 mg/dL (ref 70–99)
Potassium: 3.8 mEq/L (ref 3.7–5.3)
Sodium: 133 mEq/L — ABNORMAL LOW (ref 137–147)

## 2014-02-14 LAB — CBC WITH DIFFERENTIAL/PLATELET
Basophils Absolute: 0 10*3/uL (ref 0.0–0.1)
Basophils Relative: 0 % (ref 0–1)
Eosinophils Absolute: 0 10*3/uL (ref 0.0–0.7)
Eosinophils Relative: 0 % (ref 0–5)
HCT: 30.3 % — ABNORMAL LOW (ref 36.0–46.0)
Hemoglobin: 9.8 g/dL — ABNORMAL LOW (ref 12.0–15.0)
Lymphocytes Relative: 16 % (ref 12–46)
Lymphs Abs: 1.7 10*3/uL (ref 0.7–4.0)
MCH: 22.4 pg — ABNORMAL LOW (ref 26.0–34.0)
MCHC: 32.3 g/dL (ref 30.0–36.0)
MCV: 69.2 fL — ABNORMAL LOW (ref 78.0–100.0)
Monocytes Absolute: 0.8 10*3/uL (ref 0.1–1.0)
Monocytes Relative: 7 % (ref 3–12)
Neutro Abs: 8.2 10*3/uL — ABNORMAL HIGH (ref 1.7–7.7)
Neutrophils Relative %: 77 % (ref 43–77)
Platelets: 284 10*3/uL (ref 150–400)
RBC: 4.38 MIL/uL (ref 3.87–5.11)
RDW: 16.3 % — ABNORMAL HIGH (ref 11.5–15.5)
WBC: 10.7 10*3/uL — ABNORMAL HIGH (ref 4.0–10.5)

## 2014-02-14 MED ORDER — ACETAMINOPHEN 500 MG PO TABS
1000.0000 mg | ORAL_TABLET | Freq: Once | ORAL | Status: AC
  Administered 2014-02-14: 1000 mg via ORAL
  Filled 2014-02-14: qty 2

## 2014-02-14 MED ORDER — SODIUM CHLORIDE 0.9 % IV BOLUS (SEPSIS)
1000.0000 mL | Freq: Once | INTRAVENOUS | Status: AC
  Administered 2014-02-14: 1000 mL via INTRAVENOUS

## 2014-02-14 MED ORDER — PRENATAL MULTIVITAMIN CH
1.0000 | ORAL_TABLET | Freq: Every day | ORAL | Status: DC
  Administered 2014-02-15 – 2014-02-17 (×3): 1 via ORAL
  Filled 2014-02-14 (×3): qty 1

## 2014-02-14 MED ORDER — IOHEXOL 300 MG/ML  SOLN
100.0000 mL | Freq: Once | INTRAMUSCULAR | Status: AC | PRN
  Administered 2014-02-14: 80 mL via INTRAVENOUS

## 2014-02-14 MED ORDER — ZOLPIDEM TARTRATE 5 MG PO TABS
5.0000 mg | ORAL_TABLET | Freq: Every evening | ORAL | Status: DC | PRN
  Administered 2014-02-16: 5 mg via ORAL
  Filled 2014-02-14: qty 1

## 2014-02-14 MED ORDER — CLINDAMYCIN PHOSPHATE 900 MG/50ML IV SOLN
900.0000 mg | Freq: Three times a day (TID) | INTRAVENOUS | Status: DC
  Administered 2014-02-14 – 2014-02-16 (×6): 900 mg via INTRAVENOUS
  Filled 2014-02-14 (×7): qty 50

## 2014-02-14 MED ORDER — VANCOMYCIN HCL IN DEXTROSE 1-5 GM/200ML-% IV SOLN
1000.0000 mg | Freq: Once | INTRAVENOUS | Status: AC
  Administered 2014-02-14: 1000 mg via INTRAVENOUS
  Filled 2014-02-14: qty 200

## 2014-02-14 MED ORDER — VANCOMYCIN HCL IN DEXTROSE 1-5 GM/200ML-% IV SOLN
1000.0000 mg | Freq: Three times a day (TID) | INTRAVENOUS | Status: DC
  Administered 2014-02-15 – 2014-02-16 (×5): 1000 mg via INTRAVENOUS
  Filled 2014-02-14 (×7): qty 200

## 2014-02-14 MED ORDER — SODIUM CHLORIDE 0.9 % IV SOLN
INTRAVENOUS | Status: DC
  Administered 2014-02-14 – 2014-02-17 (×5): via INTRAVENOUS

## 2014-02-14 MED ORDER — MORPHINE SULFATE 4 MG/ML IJ SOLN
4.0000 mg | Freq: Once | INTRAMUSCULAR | Status: DC
  Filled 2014-02-14: qty 1

## 2014-02-14 MED ORDER — ACETAMINOPHEN 325 MG PO TABS
650.0000 mg | ORAL_TABLET | ORAL | Status: DC | PRN
  Administered 2014-02-16: 650 mg via ORAL
  Filled 2014-02-14: qty 2

## 2014-02-14 MED ORDER — CALCIUM CARBONATE ANTACID 500 MG PO CHEW: 2.0000 | CHEWABLE_TABLET | ORAL | Status: DC | PRN

## 2014-02-14 MED ORDER — DOCUSATE SODIUM 100 MG PO CAPS
100.0000 mg | ORAL_CAPSULE | Freq: Every day | ORAL | Status: DC
  Administered 2014-02-15 – 2014-02-17 (×2): 100 mg via ORAL
  Filled 2014-02-14 (×3): qty 1

## 2014-02-14 MED ORDER — CLINDAMYCIN PHOSPHATE 900 MG/50ML IV SOLN
900.0000 mg | Freq: Once | INTRAVENOUS | Status: AC
  Administered 2014-02-14: 900 mg via INTRAVENOUS
  Filled 2014-02-14: qty 50

## 2014-02-14 NOTE — ED Notes (Signed)
ENT at bedside performing I and D. Pt resting comfortably in bed.

## 2014-02-14 NOTE — ED Notes (Signed)
[facility] Pt will be assigned room at Antenatal- Room 58. Laurene FootmanKylie Wilson RN given report. Contacted admitting Md Emelda FearFerguson to ensure bed request was correct and that admission orders have not been completed at this time. Md Emelda FearFerguson in agree ance with plan. AC, Charge RN and Barbaraann ShareKim Tassone with Maternity Admissions made aware of plan of care.

## 2014-02-14 NOTE — Op Note (Signed)
02/14/2014  6:46 PM    Katheran JamesBanks, Korey  [id number]   Pre-Op Dx:  Facial abscess  Post-op Dx: Same  Proc: Incision and drainage, right facial abscess   Surg:  Flo ShanksWOLICKI, Elester Apodaca T MD  Anes:  Local  EBL:  Minimal  Comp:  None  Findings:  Tender induration of the right face in the region of the nasolabial fold with a small eschar and tiny draining tract at the skin surface.  No obvious connection with the teeth. Several  ramified tracts on exploration.  Procedure: Was informed consent Hurricaine spray in the right upper gingivobuccal sulcus followed by 6 cc of 2% Xylocaine with 1-200,000 epinephrine infraorbital nerve block.    After ascertaining adequate anesthesia, the area was cleaned with Betadine solution. A 5 mm sharp incision was performed with an 11 blade scalpel and frank pus was encountered and cultured. The    cavity was explored with a hemostat. All loculations were opened. The cavity was packed with 1/4 inch iodoform Nu Gauze. A small absorbent dressing was placed on the surface. Hemostasis was observed. She tolerated the procedure nicely.  [facility]   [facility] given her gravid status.  Plan:  Infectious disease consultation. Clindamycin plus vancomycin for the time being pending culture results. Will have her doctors remove the Nu Gauze Friday morning.  Cephus RicherWOLICKI,  Kale Rondeau T MD

## 2014-02-14 NOTE — ED Notes (Signed)
Pt is [redacted] weeks pregnant.
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
## 2014-02-14 NOTE — ED Notes (Signed)
Md Silverio LayYao and [place] at bedside evaluating pt.

## 2014-02-14 NOTE — ED Notes (Signed)
Pt c/o eye swelling that started 2 days ago. Denies blurred vision. Hx of abscess in other locations. Pt is currently pregnant, 36 weeks and 4 days. Denies abd pain/cramping. sts she has been feeling the baby move. Nad, skin warm and dry, resp e/u.

## 2014-02-14 NOTE — ED Provider Notes (Signed)
CSN: [id number]     Arrival date & time 02/14/14  1104 History   First MD Initiated Contact with Patient 02/14/14 1215     Chief Complaint  Patient presents with  . Eye Pain  . Abscess     (Consider location/radiation/quality/duration/timing/severity/associated sxs/prior Treatment) HPI: Cindy Bruce is a 23 year old woman who is [redacted] weeks pregnant with a PMH of DM Type 2 who presents to the ED today with a chief complaint of right facial swelling and pain.  She reports that three days ago she noticed a small red, raised spot by her right nare.  Over the course of the past three days the redness and swelling have gradually spread, to the point where today her eye is swollen shut.  She also states that her right face is painful due to the pressure.  She has applied warm compresses and triple antibiotic ointment with no improvement in pain or swelling.  She states that the pustule that has now formed where the initial spot was has not drained; she denies picking, scratching, squeezing, or inserting objects into the pustule.  She denies fever, chills, and malaise.  She denies vision changes and photophobia.  She reports that the right eye has been watering, and it is painful when she holds the eye open.  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  These were not MRSA positive, and she does not believe she has ever had a MRSA infection before.  She does not work in healthcare or in a nursing facility.  She was last treated with antibiotics 2-3 weeks ago for bacterial vaginosis.    Past Medical History  Diagnosis Date  . Asthma   . 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 Tooth extraction     Family History  Problem Relation Age of Onset  . Diabetes Mother   . Diabetes Maternal Grandmother    History  Substance Use Topics  .  Smoking status: Former Smoker -- 0.25 packs/day    Types: Cigarettes  . Smokeless tobacco: Former Neurosurgeon    Quit date: 09/05/2012  . Alcohol Use: No   OB History   Grav Para Term Preterm Abortions TAB SAB Ect Mult Living   3 2 2  0 0 0 0 0 0 2     Review of Systems    Allergies  Food and Watermelon  Home Medications   Current Outpatient Rx  Name  Route  Sig  Dispense  Refill  . acetaminophen (TYLENOL) 325 MG tablet   Oral   Take 650 mg by mouth every 6 (six) hours as needed for mild pain or headache.         . glyBURIDE (DIABETA) 2.5 MG tablet   Oral   Take 1 tablet (2.5 mg total) by mouth 2 (two) times daily with a meal. Take one tablet by mouth at dinner   60 tablet   1   . metFORMIN (GLUCOPHAGE) 1000 MG tablet   Oral   Take 1 tablet (1,000 mg total) by mouth 2 (two) times daily with a meal.   60 tablet   2   . neomycin-bacitracin-polymyxin (NEOSPORIN) ointment   Topical   Apply 1 application topically 2 (two) times daily as needed for wound care. apply to eye         .  ACCU-CHEK FASTCLIX LANCETS MISC   Percutaneous   1 Units by Percutaneous route 4 (four) times daily.   100 each   12     Dx: 648.83   . glucose blood (ACCU-CHEK SMARTVIEW) test strip      Check blood sugars 4x/daily   100 each   12     Dx code:  648.83    BP 115/75  Pulse 102  Temp(Src) 98.6 F (37 C)  Resp 18  Ht 5\' 1"  (1.549 m)  Wt 180 lb 3.2 oz (81.738 kg)  BMI 34.07 kg/m2  SpO2 100% Physical Exam  Nursing note and vitals reviewed. Constitutional: She is oriented to person, place, and time. She appears well-developed and well-nourished. No distress.  HENT:  Head: Normocephalic and atraumatic.  Mouth/Throat: Oropharynx is clear and moist.  Fluctuant pustule noted 2-3 cm lateral to the right nare.  Right face is erythematous, swollen, and painful to light touch from below pustule to the inferior edge of the eyebrow.  Right eye is swollen shut but can be opened manually.   Some clear, watery discharge noted when eye opened.  No conjunctival redness, no pain with EOMs.  Eyes: Pupils are equal, round, and reactive to light.  Neck: Normal range of motion. Neck supple.  Cardiovascular: Normal rate, regular rhythm and normal heart sounds.  Exam reveals no gallop and no friction rub.   No murmur heard. Pulmonary/Chest: Effort normal and breath sounds normal. No respiratory distress.  Neurological: She is alert and oriented to person, place, and time. She exhibits normal muscle tone. Coordination normal.  Skin: Skin is warm and dry. No rash noted. No erythema.    ED Course  Procedures (including critical care time) Labs Review Labs Reviewed  CBC WITH DIFFERENTIAL - Abnormal; Notable for the following:    WBC 10.7 (*)    Hemoglobin 9.8 (*)    HCT 30.3 (*)    MCV 69.2 (*)    MCH 22.4 (*)    RDW 16.3 (*)    Neutro Abs 8.2 (*)    All other components within normal limits  BASIC METABOLIC PANEL - Abnormal; Notable for the following:    Sodium 133 (*)    CO2 17 (*)    All other components within normal limits  WOUND CULTURE   Imaging Review Ct Maxillofacial W/cm  02/14/2014   CLINICAL DATA Right eye pain and swelling  EXAM CT MAXILLOFACIAL WITH CONTRAST  TECHNIQUE Multidetector CT imaging of the maxillofacial structures was performed with intravenous contrast. Multiplanar CT image reconstructions were also generated. A small metallic BB was placed on the right temple in order to reliably differentiate right from left.  CONTRAST 80mL OMNIPAQUE IOHEXOL 300 MG/ML  SOLN  COMPARISON None.  FINDINGS Patient is pregnant and was shielded for the CT of the face.  There is moderate soft tissue swelling around the right eye involving the upper and lower eyelid. There is stranding in the subcutaneous fat of the right maxilla. Findings are most compatible with cellulitis. No soft tissue abscess. This is periorbital cellulitis and does not extend into the orbit. No orbital  abscess. The globe is normal bilaterally. Extraocular muscles are normal.  There is a foreign body in the soft tissues medial to the medial canthus of the right orbit. There is surrounding soft tissue swelling but no abscess. This could be a piece of glass or plastic or possibly metal. Correlate with history. This could be an incidental finding however could be related to  ongoing infection.  Mucosal edema is present in the left maxillary sinus and left ethmoid sinus. No air-fluid level.  Multiple dental caries. Multiple areas of periapical abscess and extensive dental infection.  IMPRESSION Periorbital cellulitis on the right without abscess or extension into the orbit. There is a radiopaque foreign bodies in the soft tissues medial to the right medial canthus. This foreign body measures 2 x 5 mm and could be an incidental finding however correlation with history is suggested.  Severe dental infection  Chronic sinusitis.  SIGNATURE  Electronically Signed   By: Marlan Palau M.D.   On: 02/14/2014 16:00     Patient started on empiric clindamycin.  Consulted maxillofacial, who recommended head CT (see above) and admission for IV antibiotics and observation.  Dr. Lazarus Salines performed I&D at the bedside, culture of drainage obtained.  ID consulted and recommend switching from clindamycin to vancomycin.  Due to Ms. Sammons advanced pregnancy (36 weeks), she will be transferred to [facility].      Carlyle Dolly, PA-C 02/14/14 1955  Carlyle Dolly, PA-C 02/14/14 1956

## 2014-02-15 ENCOUNTER — Other Ambulatory Visit: Payer: Medicaid Other

## 2014-02-15 DIAGNOSIS — L03211 Cellulitis of face: Secondary | ICD-10-CM

## 2014-02-15 DIAGNOSIS — O24919 Unspecified diabetes mellitus in pregnancy, unspecified trimester: Secondary | ICD-10-CM

## 2014-02-15 DIAGNOSIS — L0201 Cutaneous abscess of face: Secondary | ICD-10-CM

## 2014-02-15 DIAGNOSIS — O9989 Other specified diseases and conditions complicating pregnancy, childbirth and the puerperium: Principal | ICD-10-CM

## 2014-02-15 DIAGNOSIS — O99891 Other specified diseases and conditions complicating pregnancy: Principal | ICD-10-CM

## 2014-02-15 DIAGNOSIS — A4901 Methicillin susceptible Staphylococcus aureus infection, unspecified site: Secondary | ICD-10-CM

## 2014-02-15 DIAGNOSIS — E119 Type 2 diabetes mellitus without complications: Secondary | ICD-10-CM

## 2014-02-15 LAB — CBC
HCT: 28.4 % — ABNORMAL LOW (ref 36.0–46.0)
HEMOGLOBIN: 9 g/dL — AB (ref 12.0–15.0)
MCH: 21.7 pg — AB (ref 26.0–34.0)
MCHC: 31.7 g/dL (ref 30.0–36.0)
MCV: 68.4 fL — ABNORMAL LOW (ref 78.0–100.0)
Platelets: 304 10*3/uL (ref 150–400)
RBC: 4.15 MIL/uL (ref 3.87–5.11)
RDW: 16.5 % — ABNORMAL HIGH (ref 11.5–15.5)
WBC: 9.2 10*3/uL (ref 4.0–10.5)

## 2014-02-15 LAB — GLUCOSE, CAPILLARY
GLUCOSE-CAPILLARY: 165 mg/dL — AB (ref 70–99)
GLUCOSE-CAPILLARY: 86 mg/dL (ref 70–99)
GLUCOSE-CAPILLARY: 90 mg/dL (ref 70–99)
Glucose-Capillary: 165 mg/dL — ABNORMAL HIGH (ref 70–99)
Glucose-Capillary: 85 mg/dL (ref 70–99)

## 2014-02-15 LAB — URINALYSIS, ROUTINE W REFLEX MICROSCOPIC
Bilirubin Urine: NEGATIVE
Glucose, UA: 500 mg/dL — AB
Hgb urine dipstick: NEGATIVE
KETONES UR: NEGATIVE mg/dL
LEUKOCYTES UA: NEGATIVE
Nitrite: NEGATIVE
PH: 5.5 (ref 5.0–8.0)
Protein, ur: NEGATIVE mg/dL
Urobilinogen, UA: 0.2 mg/dL (ref 0.0–1.0)

## 2014-02-15 LAB — MRSA PCR SCREENING: MRSA by PCR: NEGATIVE

## 2014-02-15 LAB — VANCOMYCIN, TROUGH: VANCOMYCIN TR: 5.6 ug/mL — AB (ref 10.0–20.0)

## 2014-02-15 MED ORDER — INSULIN ASPART 100 UNIT/ML ~~LOC~~ SOLN
0.0000 [IU] | Freq: Three times a day (TID) | SUBCUTANEOUS | Status: DC
  Administered 2014-02-15: 3 [IU] via SUBCUTANEOUS

## 2014-02-15 MED ORDER — INSULIN ASPART 100 UNIT/ML ~~LOC~~ SOLN
4.0000 [IU] | Freq: Three times a day (TID) | SUBCUTANEOUS | Status: DC
  Administered 2014-02-15 – 2014-02-17 (×6): 4 [IU] via SUBCUTANEOUS

## 2014-02-15 MED ORDER — GLYBURIDE 2.5 MG PO TABS
2.5000 mg | ORAL_TABLET | Freq: Two times a day (BID) | ORAL | Status: DC
  Administered 2014-02-15 – 2014-02-17 (×5): 2.5 mg via ORAL
  Filled 2014-02-15 (×7): qty 1

## 2014-02-15 MED ORDER — HYDROCODONE-ACETAMINOPHEN 5-325 MG PO TABS
2.0000 | ORAL_TABLET | ORAL | Status: DC | PRN
  Administered 2014-02-15 (×3): 2 via ORAL
  Administered 2014-02-16: 1 via ORAL
  Administered 2014-02-16: 2 via ORAL
  Filled 2014-02-15 (×5): qty 2

## 2014-02-15 NOTE — H&P (Signed)
Cindy Bruce is a [age] y.o. female presenting for admission to antenatal for severe orbital cellulitis, s/p I&D of right facial abscess by EENT, and placed on IV Vancomycin and Cleocin. She has had facial swelling x 3 days, unable to see out of right  Eye x 2 days.  . Due to her advanced pregnancy, [facility]. She received contrast medium at time of CT head, and cannot take her usual metformin x 2 days. History OB History   Grav Para Term Preterm Abortions TAB SAB Ect Mult Living   3 2 2  0 0 0 0 0 0 2     Past Medical History  Diagnosis Date  . Asthma   . 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 Tooth extraction     Family History: family history includes Diabetes in her maternal grandmother and mother. Social History:  reports that she has quit smoking. Her smoking use included Cigarettes. She smoked 0.25 packs per day. She quit smokeless tobacco use about 17 months ago. She reports that she does not drink alcohol or use illicit drugs.   Prenatal Transfer Tool  Maternal Diabetes: Yes:  Diabetes Type:  Pre-pregnancy, Insulin/Medication controlled Genetic Screening: Normal Maternal Ultrasounds/Referrals: Normal Fetal Ultrasounds or other Referrals:   Maternal Substance Abuse:  No Significant Maternal Medications:  Meds include: Other:  Significant Maternal Lab Results:  Lab values include: Other:  Other Comments:  Ct face done see report  Review of Systems  Constitutional: Positive for fever. Negative for chills.  Genitourinary: Positive for dysuria.  Neurological: Positive for headaches.      Blood pressure 130/72, pulse 105, temperature 97.9 F (36.6 C), temperature source Oral, resp. rate 18, height 5\' 1"  (1.549 m), weight 81.647 kg (180 lb), SpO2 100.00%. Exam Physical Exam  Constitutional: She appears well-developed and well-nourished.  HENT:  Gross swelling  and edema around right orbit, pt experienced swelling x 3 days, and eye swollen shut x 2d   gravid nontender uterus. Prenatal labs: ABO, Rh: O/NEG/-- (10/06 1122) Antibody: NEG (01/26 1152) Rubella: 0.83 (10/06 1122) RPR: NON REAC (01/26 1150)  HBsAg: NEGATIVE (10/06 1122)  HIV: NON REACTIVE (01/26 1150)  GBS:     Assessment/Plan: Periorbital cellulitis on Zosyn, vancomycin Admit Consult ENT, and Inf disease Currently 36 wks, monitor PTL sx.   Cindy Bruce 02/15/2014, 12:15 AM
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
## 2014-02-15 NOTE — Progress Notes (Signed)
UR completed 

## 2014-02-16 LAB — CBC
HCT: 27.7 % — ABNORMAL LOW (ref 36.0–46.0)
Hemoglobin: 8.8 g/dL — ABNORMAL LOW (ref 12.0–15.0)
MCH: 21.9 pg — ABNORMAL LOW (ref 26.0–34.0)
MCHC: 31.8 g/dL (ref 30.0–36.0)
MCV: 69.1 fL — AB (ref 78.0–100.0)
PLATELETS: 268 10*3/uL (ref 150–400)
RBC: 4.01 MIL/uL (ref 3.87–5.11)
RDW: 16.6 % — AB (ref 11.5–15.5)
WBC: 7.5 10*3/uL (ref 4.0–10.5)

## 2014-02-16 LAB — GLUCOSE, CAPILLARY
GLUCOSE-CAPILLARY: 86 mg/dL (ref 70–99)
Glucose-Capillary: 75 mg/dL (ref 70–99)
Glucose-Capillary: 101 mg/dL — ABNORMAL HIGH (ref 70–99)
Glucose-Capillary: 56 mg/dL — ABNORMAL LOW (ref 70–99)

## 2014-02-16 LAB — WET PREP, GENITAL
Clue Cells Wet Prep HPF POC: NONE SEEN
TRICH WET PREP: NONE SEEN

## 2014-02-16 LAB — VANCOMYCIN, TROUGH: Vancomycin Tr: 8.3 ug/mL — ABNORMAL LOW (ref 10.0–20.0)

## 2014-02-16 MED ORDER — HYDROCODONE-ACETAMINOPHEN 5-325 MG PO TABS: 1.0000 | ORAL_TABLET | ORAL | Status: DC | PRN

## 2014-02-16 MED ORDER — VANCOMYCIN HCL 10 G IV SOLR
1250.0000 mg | Freq: Three times a day (TID) | INTRAVENOUS | Status: DC
  Administered 2014-02-16 – 2014-02-17 (×3): 1250 mg via INTRAVENOUS
  Filled 2014-02-16 (×5): qty 1250

## 2014-02-16 MED ORDER — MUPIROCIN 2 % EX OINT
TOPICAL_OINTMENT | Freq: Two times a day (BID) | CUTANEOUS | Status: DC
  Administered 2014-02-16 – 2014-02-17 (×3): via NASAL
  Filled 2014-02-16: qty 22

## 2014-02-16 MED ORDER — CLINDAMYCIN HCL 300 MG PO CAPS
600.0000 mg | ORAL_CAPSULE | Freq: Three times a day (TID) | ORAL | Status: DC
  Administered 2014-02-16 – 2014-02-17 (×3): 600 mg via ORAL
  Filled 2014-02-16 (×6): qty 2

## 2014-02-16 MED ORDER — MISOPROSTOL 200 MCG PO TABS
ORAL_TABLET | ORAL | Status: AC
  Filled 2014-02-16: qty 5

## 2014-02-16 NOTE — Consult Note (Signed)
Neonatology Consult  Note:  At the request of the patients obstetrician Dr. Glo Herring I met with Cindy Bruce who is at 32 wks currently with pregnancy complicated by a facial abscess, poorly controled type 2 DM and fetus with possible cardiac anomalies.   I am able to view the 12/4 echocardiogram performed by Dr. 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  The patient is unsure when her last echocardiogram was performed however thinks that she was seen in February.  This would be consistent with Dr. Jonna Clark recommendation for a repeat echo to be performed 6 weeks after the first, however I am not able to access this report at present.  Mother was unsure as to the findings of the last echo however seemed to think that there was "a hole in the heart". Consult limited by inability to view last echo (if performed) however we reviewed likely normal initial course until the PVR decreases and plan to obtain an echocardiogram within the first 24 hours of birth.  Discussed possible NICU admission and possible referral to [facility] for cardiac surgery should the postnatal echo demonstrate a significant lesion which cannot be medically managed.  Thank you for allowing [place] to participate in her care.    Higinio Roger, DO  Neonatologist  The total length of face-to-face or floor / unit time for this encounter was 20 minutes.  Counseling and / or coordination of care was greater than fifty percent of the time.

## 2014-02-16 NOTE — Progress Notes (Signed)
S: Vaginal discharge when using the toilet, mucus on paper after wiping. Concern of membranes rupture. No VB, infrequent ctx,   O: Speculum exam: Thick white, cottage-cheese discharge. Minimal vault erythema, no pooling of fluids, no cervical discharge with cough.   A/P: 23 y.o. [id number] @ 6485w0d presents for periorbital cellulitis, being followed by ID. No evidence of ferning, no suggestive of ROM.   # Vaginal Discharge - wet prep pending   # continue Vanc/clinda until culture results, per ID.      Reviewed with resident Agree with note Continue care. Aviva SignsMarie L Jenelle Drennon, CNM

## 2014-02-16 NOTE — ED Provider Notes (Signed)
Medical screening examination/treatment/procedure(s) were conducted as a shared visit with non-physician practitioner(s) and myself.  I personally evaluated the patient during the encounter.   EKG Interpretation None      Cindy NewportZenda T Bruce is a 23 y.o. female 8936 weeks pregnant with R facial abscess. Started with a pimple R face, now spreads to her eye. On exam, impressive erythema around R eye and R face. EOMI, no obvious conjunctivitis. CT showed periorbital cellulitis with no obvious abscess. Given clinda in the ED. ENT, Dr. Lazarus SalinesWolicki, consulted and performed I and D at bedside. He recommend admission for IV abx. Patient was [redacted] weeks pregnant, so will transfer to women's.    Richardean Canalavid H Vernelle Wisner, MD 02/16/14 [phone]

## 2014-02-16 NOTE — Progress Notes (Signed)
Pt is on Vancomycin for nasolabial cellulitis.  She is not a pharmacy protocol, but recommended to check trough today which was 8.3mg /L.  Pt has had I&D of wound and is improved per Dr. Shawnie PonsPratt, but per discussion with MD will increase her Vancomycin dose to 1250 mg IV every 8 hours to get trough in range of 10-15 mg/L.  If plans to continue Vancomycin through weekend, recommend to check serum creatinine.  Awaiting sensitivities of wound culture which is growing Staph aureus.  ID following.    Cindy Bruce, Pharm.D.

## 2014-02-16 NOTE — Progress Notes (Signed)
[facility] COMPREHENSIVE PROGRESS NOTE  Cindy Bruce is a 23 y.o. G3P2002 at [redacted]w[redacted]d who was admitted on 02/14/14 s/p I&D of right facial abscess.    Estimated Date of Delivery: 03/09/14  Length of Stay:  2 Days. 02/14/2014  Subjective: Improved swelling and decreased pain Patient reports good fetal movement.  She reports no uterine contractions, no bleeding and no loss of fluid per vagina.  Vitals:  Blood pressure 129/69, pulse 92, temperature 98.5 F (36.9 C), temperature source Oral, resp. rate 18, height $RemoveBe'5\' 1"'ItxbYbBiN$  (1.549 m), weight 183 lb 6.4 oz (83.19 kg), SpO2 100.00%. Physical Examination: Face: Swollen lower lid on the right > than upper lid on the right side as well as as rightsided facial swelling. Bandaged. She is able to see out of both eyes. Abdomen: Soft, gravid, nontendeder Cervical Exam: Not evaluated Extremities: extremities normal, atraumatic, no cyanosis or edema and Homans sign is negative, no sign of DVT with DTRs 2+ bilaterally Membranes:intact  Fetal Monitoring:  Baseline: 140 bpm, Variability: Moderate, Accelerations: Reactive and Decelerations: Absent Tocometry: Flat*  Results for orders placed during the hospital encounter of 02/14/14 (from the past 48 hour(s))  CBC WITH DIFFERENTIAL     Status: Abnormal   Collection Time    02/14/14  1:16 PM      Result Value Ref Range   WBC 10.7 (*) 4.0 - 10.5 K/uL   RBC 4.38  3.87 - 5.11 MIL/uL   Hemoglobin 9.8 (*) 12.0 - 15.0 g/dL   HCT 30.3 (*) 36.0 - 46.0 %   MCV 69.2 (*) 78.0 - 100.0 fL   MCH 22.4 (*) 26.0 - 34.0 pg   MCHC 32.3  30.0 - 36.0 g/dL   RDW 16.3 (*) 11.5 - 15.5 %   Platelets 284  150 - 400 K/uL   Neutrophils Relative % 77  43 - 77 %   Lymphocytes Relative 16  12 - 46 %   Monocytes Relative 7  3 - 12 %   Eosinophils Relative 0  0 - 5 %   Basophils Relative 0  0 - 1 %   Neutro Abs 8.2 (*) 1.7 - 7.7 K/uL   Lymphs Abs 1.7  0.7 - 4.0 K/uL   Monocytes Absolute 0.8  0.1 - 1.0 K/uL    Eosinophils Absolute 0.0  0.0 - 0.7 K/uL   Basophils Absolute 0.0  0.0 - 0.1 K/uL   RBC Morphology POLYCHROMASIA PRESENT    BASIC METABOLIC PANEL     Status: Abnormal   Collection Time    02/14/14  1:16 PM      Result Value Ref Range   Sodium 133 (*) 137 - 147 mEq/L   Potassium 3.8  3.7 - 5.3 mEq/L   Chloride 100  96 - 112 mEq/L   CO2 17 (*) 19 - 32 mEq/L   Glucose, Bld 87  70 - 99 mg/dL   BUN 6  6 - 23 mg/dL   Creatinine, Ser 0.51  0.50 - 1.10 mg/dL   Calcium 8.9  8.4 - 10.5 mg/dL   GFR calc non Af Amer >90  >90 mL/min   GFR calc Af Amer >90  >90 mL/min   Comment: (NOTE)     The eGFR has been calculated using the CKD EPI equation.     This calculation has not been validated in all clinical situations.     eGFR's persistently <90 mL/min signify possible Chronic Kidney     Disease.  WOUND CULTURE  Status: None   Collection Time    02/14/14  7:03 PM      Result Value Ref Range   Specimen Description WOUND     Special Requests RIGHT NASOLABIAL FOLD     Gram Stain       Value: NO WBC SEEN     NO SQUAMOUS EPITHELIAL CELLS SEEN     NO ORGANISMS SEEN     Performed at [facility]   Culture       Value: NO GROWTH 1 DAY     Performed at [facility]   Report Status PENDING    GLUCOSE, CAPILLARY     Status: Abnormal   Collection Time    02/15/14 12:16 AM      Result Value Ref Range   Glucose-Capillary 165 (*) 70 - 99 mg/dL  URINALYSIS, ROUTINE W REFLEX MICROSCOPIC     Status: Abnormal   Collection Time    02/15/14  2:05 AM      Result Value Ref Range   Color, Urine YELLOW  YELLOW   APPearance CLEAR  CLEAR   Specific Gravity, Urine >1.030 (*) 1.005 - 1.030   pH 5.5  5.0 - 8.0   Glucose, UA 500 (*) NEGATIVE mg/dL   Hgb urine dipstick NEGATIVE  NEGATIVE   Bilirubin Urine NEGATIVE  NEGATIVE   Ketones, ur NEGATIVE  NEGATIVE mg/dL   Protein, ur NEGATIVE  NEGATIVE mg/dL   Urobilinogen, UA 0.2  0.0 - 1.0 mg/dL   Nitrite NEGATIVE  NEGATIVE   Leukocytes, UA  NEGATIVE  NEGATIVE   Comment: MICROSCOPIC NOT DONE ON URINES WITH NEGATIVE PROTEIN, BLOOD, LEUKOCYTES, NITRITE, OR GLUCOSE <1000 mg/dL.  CBC     Status: Abnormal   Collection Time    02/15/14  5:58 AM      Result Value Ref Range   WBC 9.2  4.0 - 10.5 K/uL   RBC 4.15  3.87 - 5.11 MIL/uL   Hemoglobin 9.0 (*) 12.0 - 15.0 g/dL   HCT 28.4 (*) 36.0 - 46.0 %   MCV 68.4 (*) 78.0 - 100.0 fL   MCH 21.7 (*) 26.0 - 34.0 pg   MCHC 31.7  30.0 - 36.0 g/dL   RDW 16.5 (*) 11.5 - 15.5 %   Platelets 304  150 - 400 K/uL  GLUCOSE, CAPILLARY     Status: Abnormal   Collection Time    02/15/14  7:58 AM      Result Value Ref Range   Glucose-Capillary 165 (*) 70 - 99 mg/dL  VANCOMYCIN, TROUGH     Status: Abnormal   Collection Time    02/15/14 11:55 AM      Result Value Ref Range   Vancomycin Tr 5.6 (*) 10.0 - 20.0 ug/mL   Comment: Performed at [place], CAPILLARY     Status: None   Collection Time    02/15/14  2:45 PM      Result Value Ref Range   Glucose-Capillary 86  70 - 99 mg/dL   Comment 1 Documented in Chart     Comment 2 Notify RN    MRSA PCR SCREENING     Status: None   Collection Time    02/15/14  6:20 PM      Result Value Ref Range   MRSA by PCR NEGATIVE  NEGATIVE   Comment:            The GeneXpert MRSA Assay (FDA     approved for NASAL  specimens     only), is one component of a     comprehensive MRSA colonization     surveillance program. It is not     intended to diagnose MRSA     infection nor to guide or     monitor treatment for     MRSA infections.  GLUCOSE, CAPILLARY     Status: None   Collection Time    02/15/14  8:50 PM      Result Value Ref Range   Glucose-Capillary 85  70 - 99 mg/dL  GLUCOSE, CAPILLARY     Status: None   Collection Time    02/15/14 10:40 PM      Result Value Ref Range   Glucose-Capillary 90  70 - 99 mg/dL  CBC     Status: Abnormal   Collection Time    02/16/14  6:08 AM      Result Value Ref Range   WBC 7.5  4.0 - 10.5 K/uL    RBC 4.01  3.87 - 5.11 MIL/uL   Hemoglobin 8.8 (*) 12.0 - 15.0 g/dL   HCT 27.7 (*) 36.0 - 46.0 %   MCV 69.1 (*) 78.0 - 100.0 fL   Comment: REPEATED TO VERIFY   MCH 21.9 (*) 26.0 - 34.0 pg   MCHC 31.8  30.0 - 36.0 g/dL   RDW 16.6 (*) 11.5 - 15.5 %   Platelets 268  150 - 400 K/uL    Ct Maxillofacial W/cm  02/14/2014   CLINICAL DATA Right eye pain and swelling  EXAM CT MAXILLOFACIAL WITH CONTRAST  TECHNIQUE Multidetector CT imaging of the maxillofacial structures was performed with intravenous contrast. Multiplanar CT image reconstructions were also generated. A small metallic BB was placed on the right temple in order to reliably differentiate right from left.  CONTRAST 47mL OMNIPAQUE IOHEXOL 300 MG/ML  SOLN  COMPARISON None.  FINDINGS Patient is pregnant and was shielded for the CT of the face.  There is moderate soft tissue swelling around the right eye involving the upper and lower eyelid. There is stranding in the subcutaneous fat of the right maxilla. Findings are most compatible with cellulitis. No soft tissue abscess. This is periorbital cellulitis and does not extend into the orbit. No orbital abscess. The globe is normal bilaterally. Extraocular muscles are normal.  There is a foreign body in the soft tissues medial to the medial canthus of the right orbit. There is surrounding soft tissue swelling but no abscess. This could be a piece of glass or plastic or possibly metal. Correlate with history. This could be an incidental finding however could be related to ongoing infection.  Mucosal edema is present in the left maxillary sinus and left ethmoid sinus. No air-fluid level.  Multiple dental caries. Multiple areas of periapical abscess and extensive dental infection.  IMPRESSION Periorbital cellulitis on the right without abscess or extension into the orbit. There is a radiopaque foreign bodies in the soft tissues medial to the right medial canthus. This foreign body measures 2 x 5 mm and could  be an incidental finding however correlation with history is suggested.  Severe dental infection  Chronic sinusitis.  SIGNATURE  Electronically Signed   By: Franchot Gallo M.D.   On: 02/14/2014 16:00    Current scheduled medications . clindamycin (CLEOCIN) IV  900 mg Intravenous 3 times per day  . docusate sodium  100 mg Oral Daily  . glyBURIDE  2.5 mg Oral BID WC  . insulin aspart  0-15  Units Subcutaneous TID WC  . insulin aspart  4 Units Subcutaneous TID WC  . prenatal multivitamin  1 tablet Oral Q1200  . vancomycin  1,000 mg Intravenous Q8H   I have reviewed the patient's current medications.  ASSESSMENT: Patient Active Problem List   Diagnosis Date Noted  . Periorbital cellulitis of right eye 02/14/2014  . Cellulitis and abscess of face 02/14/2014  . GBS (group B streptococcus) UTI complicating pregnancy 50/15/8682  . Supervision of high-risk pregnancy 09/11/2013  . Diabetes mellitus complicating pregnancy, antepartum 09/11/2013  . Short interval between pregnancies complicating pregnancy, antepartum 09/11/2013  . Rh negative state in antepartum period 08/24/2013  . Asthma 07/11/2012  . Rubella non-immune status, antepartum 07/11/2012    PLAN: Continue care of facial cellulitis as per ID and ENT teams; appreciate their care of our patient Will follow up with ENT re wound care and follow up Continue Vancomycin and Clindamycin for now.  MRSA negative.  Wound cultures negative for bacteria for now but still pending. No acute pregnancy issues for now, continue routine antenatal care.   Verita Schneiders, MD, [place] Attending [place], [place]

## 2014-02-16 NOTE — Progress Notes (Signed)
Antenatal Nutrition Assessment:  Currently  [redacted] weeks gestation, with facial abscess. Height  61 "  Weight 183 lbs  pre-pregnancy weight 167 lbs .  Pre-pregnancy  BMI 31.6  IBW 105 lbs Total weight gain 16.lbs Weight gain goals 11-25 lbs Estimated needs: 1650-1850 kcal/day, 60-70 grams protein/day, 2 liters fluid/day  Carbohydrate modified gestational diet  Current diet prescription will provide for increased needs.  Nutrition related labs  CBG (last 3)   Recent Labs  02/15/14 2240 02/16/14 0824 02/16/14 1248  GLUCAP 90 75 86      Nutrition Dx: Increased nutrient needs r/t pregnancy and fetal growth requirements aeb [redacted] weeks gestation.  No educational needs assessed at this time.  Elisabeth CaraKatherine Buena Boehm M.Odis LusterEd. R.D. LDN Neonatal Nutrition Support Specialist Pager [phone]

## 2014-02-16 NOTE — Progress Notes (Signed)
    [facility]    Date of Admission:  02/14/2014   Total days of antibiotics 3           ID: Cindy Bruce is a 23 y.o. female with periorbital facial cellulitis  Active Problems:   Periorbital cellulitis of right eye   Cellulitis and abscess of face    Subjective: Afebrile, swelling improving  Medications:  . clindamycin (CLEOCIN) IV  900 mg Intravenous 3 times per day  . docusate sodium  100 mg Oral Daily  . glyBURIDE  2.5 mg Oral BID WC  . insulin aspart  0-15 Units Subcutaneous TID WC  . insulin aspart  4 Units Subcutaneous TID WC  . mupirocin ointment   Nasal BID  . prenatal multivitamin  1 tablet Oral Q1200  . vancomycin  1,250 mg Intravenous Q8H    Objective: Vital signs in last 24 hours: Temp:  [98.3 F (36.8 C)-98.6 F (37 C)] 98.3 F (36.8 C) (03/13 1201) Pulse Rate:  [90-98] 98 (03/13 1201) Resp:  [18-24] 18 (03/13 1201) BP: (100-129)/(63-69) 100/63 mmHg (03/13 1201) Weight:  [183 lb 6.4 oz (83.19 kg)-183 lb 8 oz (83.235 kg)] 183 lb 8 oz (83.235 kg) (03/13 0825) Did not examine   Lab Results  Recent Labs  02/14/14 1316 02/15/14 0558 02/16/14 0608  WBC 10.7* 9.2 7.5  HGB 9.8* 9.0* 8.8*  HCT 30.3* 28.4* 27.7*  NA 133*  --   --   K 3.8  --   --   CL 100  --   --   CO2 17*  --   --   BUN 6  --   --   CREATININE 0.51  --   --     Microbiology: Wound cx = staph aureus suscepitibilities pending Studies/Results: No results found.   Assessment/Plan: Staph aureus periorbital/facial cellulitis =continue on clindamycin and vancomycin for now. Will switch clindamycin to oral 600mg  Q8hr If culture results show MRSA, would discharge on clindamycin. If MSSA (oxacillin sensitive), would discharge on keflex 500mg  QID  X 7 days excluding what she has received in house. We will have pharmacy follow her labs to adjust for appropriate vancomycin dosing.  [name], [facility] Cell:  [phone] Pager: [phone]  02/16/2014, 4:18 PM

## 2014-02-16 NOTE — Progress Notes (Signed)
ANTIBIOTIC CONSULT NOTE - FOLLOW UP  Pharmacy Consult for Vancomycin Indication: Periorbital/ facial cellulitis  Allergies  Allergen Reactions  . Food Anaphylaxis    cantaloupe  . 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  Recent Labs  02/15/14 1155 02/16/14 1145  VANCOTROUGH 5.6* 8.3*     Microbiology: Recent Results (from the past 720 hour(s))  GC/CHLAMYDIA PROBE AMP     Status: None   Collection Time    02/12/14 12:49 PM      Result Value Ref Range Status   CT Probe RNA NEGATIVE   Final   GC Probe RNA NEGATIVE   Final   Comment:                                                                                             **Normal Reference Range: Negative**                 Assay performed using the Gen-Probe APTIMA COMBO2 (R) Assay.           Acceptable specimen types for this assay include APTIMA Swabs (Unisex,     endocervical, urethral, or vaginal), first void urine, and ThinPrep     liquid based cytology samples.  WOUND CULTURE     Status: None   Collection Time    02/14/14  7:03 PM      Result Value Ref Range Status   Specimen Description WOUND   Final   Special Requests RIGHT NASOLABIAL FOLD   Final   Gram Stain     Final   Value: NO WBC SEEN     NO SQUAMOUS EPITHELIAL CELLS SEEN     NO ORGANISMS SEEN     Performed at [facility]   Culture     Final   Value: MODERATE STAPHYLOCOCCUS AUREUS     Note: RIFAMPIN AND GENTAMICIN SHOULD NOT BE USED AS SINGLE  DRUGS FOR TREATMENT OF STAPH INFECTIONS.     Performed at [facility]   Report Status PENDING   Incomplete  MRSA PCR SCREENING     Status: None   Collection Time    02/15/14  6:20 PM      Result Value Ref Range Status   MRSA by PCR NEGATIVE  NEGATIVE Final   Comment:            The GeneXpert MRSA Assay (FDA     approved for NASAL specimens     only), is one component of a     comprehensive MRSA colonization  surveillance program. It is not     intended to diagnose MRSA     infection nor to guide or     monitor treatment for     MRSA infections.   Anti-infectives   Start     Dose/Rate Route Frequency Ordered Stop   02/16/14 2200  clindamycin (CLEOCIN) capsule 600 mg     600 mg Oral 3 times per day 02/16/14 1623     02/16/14 2000  vancomycin (VANCOCIN) 1,250 mg in sodium chloride 0.9 % 250 mL IVPB     1,250 mg 166.7 mL/hr over 90 Minutes Intravenous Every 8 hours 02/16/14 1413     02/15/14 0400  vancomycin (VANCOCIN) IVPB 1000 mg/200 mL premix  Status:  Discontinued     1,000 mg 200 mL/hr over 60 Minutes Intravenous Every 8 hours 02/14/14 2230 02/16/14 1412   02/14/14 2300  clindamycin (CLEOCIN) IVPB 900 mg  Status:  Discontinued     900 mg 100 mL/hr over 30 Minutes Intravenous 3 times per day 02/14/14 2230 02/16/14 1623   02/14/14 1900  vancomycin (VANCOCIN) IVPB 1000 mg/200 mL premix     1,000 mg 200 mL/hr over 60 Minutes Intravenous  Once 02/14/14 1846 02/14/14 2023   02/14/14 1330  clindamycin (CLEOCIN) IVPB 900 mg     900 mg 100 mL/hr over 30 Minutes Intravenous  Once 02/14/14 1316 02/14/14 1411      Assessment: 23yo F [redacted] weeks GA admitted for nasolabial cellulitis s/p I&D. Vanomycin initiated on 3/11 by MD. Trough checked today=8.80mcg/ml. Pharmacist recommended increase in dose to 1250mg  IV q8h to achieve levels > 23mcg/ml. ID requested Pharmacy consult for Vanc dosing. Awaiting sensitivities for wound culture of Staph aureus.  Goal of Therapy:  Vanc  trough 10-15mcg/ml due to current improvement in pt's status. If MRSA + and decline in pt improvement, may need to increase target to 15-23mcg/ml.  Plan:  1. Continue current regimen of Vancomycin 1250mg  IV q8h. 2. Plan to check SCr if continued through weekend. 3. Will continue to monitor culture for sensitivities. Thanks!  Cindy Bruce 02/16/2014,5:47 PM

## 2014-02-16 NOTE — Progress Notes (Signed)
MWilliams, CNM notified of pt c/o leaking and watery discharge and cramping. Coming to evaluate pt.

## 2014-02-16 NOTE — Progress Notes (Signed)
Neo Consult called to Dr. Algernon Huxleyattray. He will be down later this evening.

## 2014-02-16 NOTE — Progress Notes (Signed)
02/16/2014 9:15 AM  Cindy Bruce, Cindy Bruce [id number]  Post-Op Day 2    Temp:  [97.8 F (36.6 C)-98.6 F (37 C)] 98.4 F (36.9 C) (03/13 0755) Pulse Rate:  [90-101] 90 (03/13 0755) Resp:  [18-24] 24 (03/13 0755) BP: (113-134)/(64-78) 114/66 mmHg (03/13 0755) Weight:  [83.19 kg (183 lb 6.4 oz)-83.235 kg (183 lb 8 oz)] 83.235 kg (183 lb 8 oz) (03/13 0825),    No intake or output data in the 24 hours ending 02/16/14 0915  Results for orders placed during the hospital encounter of 02/14/14 (from the past 24 hour(s))  VANCOMYCIN, TROUGH     Status: Abnormal   Collection Time    02/15/14 11:55 AM      Result Value Ref Range   Vancomycin Tr 5.6 (*) 10.0 - 20.0 ug/mL  GLUCOSE, CAPILLARY     Status: None   Collection Time    02/15/14  2:45 PM      Result Value Ref Range   Glucose-Capillary 86  70 - 99 mg/dL   Comment 1 Documented in Chart     Comment 2 Notify RN    MRSA PCR SCREENING     Status: None   Collection Time    02/15/14  6:20 PM      Result Value Ref Range   MRSA by PCR NEGATIVE  NEGATIVE  GLUCOSE, CAPILLARY     Status: None   Collection Time    02/15/14  8:50 PM      Result Value Ref Range   Glucose-Capillary 85  70 - 99 mg/dL  GLUCOSE, CAPILLARY     Status: None   Collection Time    02/15/14 10:40 PM      Result Value Ref Range   Glucose-Capillary 90  70 - 99 mg/dL  CBC     Status: Abnormal   Collection Time    02/16/14  6:08 AM      Result Value Ref Range   WBC 7.5  4.0 - 10.5 K/uL   RBC 4.01  3.87 - 5.11 MIL/uL   Hemoglobin 8.8 (*) 12.0 - 15.0 g/dL   HCT 40.927.7 (*) 81.136.0 - 91.446.0 %   MCV 69.1 (*) 78.0 - 100.0 fL   MCH 21.9 (*) 26.0 - 34.0 pg   MCHC 31.8  30.0 - 36.0 g/dL   RDW 78.216.6 (*) 95.611.5 - 21.315.5 %   Platelets 268  150 - 400 K/uL  GLUCOSE, CAPILLARY     Status: None   Collection Time    02/16/14  8:24 AM      Result Value Ref Range   Glucose-Capillary 75  70 - 99 mg/dL   Comment 1 Documented in Chart     Comment 2 Notify RN     Culture: staph aureus, not  further identified thus far  SUBJECTIVE:  Less pain.  Able to open eyes.   OBJECTIVE:  Less facial edema.  Ribbon gauze removed.  No further pus expressed.  IMPRESSION:  Staph facial abscess  PLAN:  Not sure if this is MRSA yet.  Could send her home on presumptive treatment for MRSA, or await final culture ID.  Infectious disease to weigh in.  Recheck my office 1 week. Bactroban ointment ordered, as  well as wound precautions.  OK to shower when IV out.  Flo ShanksWOLICKI, Demontrez Rindfleisch

## 2014-02-17 LAB — GLUCOSE, CAPILLARY
GLUCOSE-CAPILLARY: 77 mg/dL (ref 70–99)
Glucose-Capillary: 72 mg/dL (ref 70–99)

## 2014-02-17 LAB — CBC
HCT: 27.5 % — ABNORMAL LOW (ref 36.0–46.0)
Hemoglobin: 8.9 g/dL — ABNORMAL LOW (ref 12.0–15.0)
MCH: 22.3 pg — ABNORMAL LOW (ref 26.0–34.0)
MCHC: 32.4 g/dL (ref 30.0–36.0)
MCV: 68.9 fL — ABNORMAL LOW (ref 78.0–100.0)
Platelets: 261 10*3/uL (ref 150–400)
RBC: 3.99 MIL/uL (ref 3.87–5.11)
RDW: 16.8 % — AB (ref 11.5–15.5)
WBC: 7.1 10*3/uL (ref 4.0–10.5)

## 2014-02-17 LAB — WOUND CULTURE: GRAM STAIN: NONE SEEN

## 2014-02-17 MED ORDER — PRENATAL MULTIVITAMIN CH: 1.0000 | ORAL_TABLET | Freq: Every day | ORAL | Status: DC

## 2014-02-17 MED ORDER — INSULIN ASPART 100 UNIT/ML ~~LOC~~ SOLN: 4.0000 [IU] | Freq: Three times a day (TID) | SUBCUTANEOUS | Status: DC

## 2014-02-17 MED ORDER — CLINDAMYCIN HCL 300 MG PO CAPS: 600.0000 mg | ORAL_CAPSULE | Freq: Three times a day (TID) | ORAL | Status: AC

## 2014-02-17 NOTE — Discharge Instructions (Signed)
Abscess An abscess (boil or furuncle) is an infected area on or under the skin. This area is filled with yellowish-white fluid (pus) and other material (debris). HOME CARE   Only take medicines as told by your doctor.  If you were given antibiotic medicine, take it as directed. Finish the medicine even if you start to feel better.  If gauze is used, follow your doctor's directions for changing the gauze.  To avoid spreading the infection:  Keep your abscess covered with a bandage.  Wash your hands well.  Do not share personal care items, towels, or whirlpools with others.  Avoid skin contact with others.  Keep your skin and clothes clean around the abscess.  Keep all doctor visits as told. GET HELP RIGHT AWAY IF:   You have more pain, puffiness (swelling), or redness in the wound site.  You have more fluid or blood coming from the wound site.  You have muscle aches, chills, or you feel sick.  You have a fever. MAKE SURE YOU:   Understand these instructions.  Will watch your condition.  Will get help right away if you are not doing well or get worse. Document Released: 05/11/2008 Document Revised: 05/24/2012 Document Reviewed: 02/05/2012 [facility] Patient Information 2014 [place]. MRSA Infection During Pregnancy Staphylococcus aureus (staph) is a type of bacteria normally found on the skin or in the nose of healthy people. If staph gets inside the body through a cut or sore, a serious infection can occur. Methicillin-resistant Staphylococcus aureus (MRSA) is an infection that is hard to cure because it involves bacteria that have become resistant to the antibiotic medicines normally used to kill them. A staph infection can be mild and affect only the skin. However, if the infection goes deeper into the body, it can be very serious.  Healthcare-associated MRSA (HA-MRSA) infections occur in hospitals, healthcare facilities, and nursing homes and are usually more severe.  Community-associated MRSA (CA-MRSA) usually causes skin infections but can develop into a more serious illness. PREGNANT WOMEN WITH MRSA  Pregnant women can be a carrier of MRSA bacteria and not have an infection. If no infection is present in the woman, there is no risk to the baby. If a pregnant woman has a MRSA infection, there is a small chance of passing the infection to the baby during a vaginal delivery. It does not appear that there is an increased risk of miscarriage or birth defects in pregnant women who are carriers of MRSA bacteria or who have an active infection. CAUSES A person can be "colonized" with MRSA. This means that the bacteria can be carried on the skin or in the nose, but no signs or symptoms of the illness are present. You can become colonized with MRSA in a variety of ways: By touching the skin of another person who is colonized with MRSA. Through contact with tiny droplets from breathing, coughing, or sneezing. By touching a contaminated surface (such as a countertop, door handle, or phone). You can develop an infection from MRSA if your skin is colonized and the bacteria enter an opening (cut, scrape, or wound) in the skin. MRSA is spread though direct contact, not through the air.  SYMPTOMS  A pimple with yellowish-white fluid (pus) in it. A fluid-filled sac (boil) on the skin. Pus draining from the skin. A fluid-filled area (abscess) under the skin or somewhere in your body. Fever with or without chills. DIAGNOSIS  A physical exam may be performed. If a skin infection is present,  the infection can be tested for MRSA with a culture. If an infection of the lung, bone, joint, or other internal area is present, blood tests and imaging studies (X-ray, CT scan, or echocardiogram) may be performed. Finding out the results of your test Ask when your test results will be ready. Make sure you get your test results. TREATMENT  Antibiotics are given that are not resistant to  the staph bacteria. The pimple, boil, or abscess may be drained or cut. HOME CARE INSTRUCTIONS  Take your antibiotics as directed. Finish them even if you start to feel better. Wash your hands often with soap and warm water. Do this especially after using the restroom, changing diapers, handling money, and right after leaving public places. Avoid people known to have a MRSA infection. Do not come in contact with people with sores and bandages that protect sores. Clean cuts and scrapes thoroughly. Cover them with a bandage. Do not try to drain a boil or pimple on your own. This could worsen the infection. Do not share towels, soaps, razors, and other personal items with people. Wash your laundry separately from the rest of the household. SEEK MEDICAL CARE IF: You have a pimple with pus in it. You have a boil on the skin. You have pus draining from the skin. You have an abscess under the skin or somewhere in your body. SEEK IMMEDIATE MEDICAL CARE IF: You have a fever. Document Released: 09/01/2008 Document Revised: 02/15/2012 Document Reviewed: 04/05/2011 [facility] Patient Information 2014 [place], [place]. Fetal Movement Counts Patient Name: __________________________________________________ Patient Due Date: ____________________ Performing a fetal movement count is highly recommended in high-risk pregnancies, but it is good for every pregnant woman to do. Your caregiver may ask you to start counting fetal movements at 28 weeks of the pregnancy. Fetal movements often increase: After eating a full meal. After physical activity. After eating or drinking something sweet or cold. At rest. Pay attention to when you feel the baby is most active. This will help you notice a pattern of your baby's sleep and wake cycles and what factors contribute to an increase in fetal movement. It is important to perform a fetal movement count at the same time each day when your baby is normally most active.  HOW  TO COUNT FETAL MOVEMENTS Find a quiet and comfortable area to sit or lie down on your left side. Lying on your left side provides the best blood and oxygen circulation to your baby. Write down the day and time on a sheet of paper or in a journal. Start counting kicks, flutters, swishes, rolls, or jabs in a 2 hour period. You should feel at least 10 movements within 2 hours. If you do not feel 10 movements in 2 hours, wait 2 3 hours and count again. Look for a change in the pattern or not enough counts in 2 hours. SEEK MEDICAL CARE IF: You feel less than 10 counts in 2 hours, tried twice. There is no movement in over an hour. The pattern is changing or taking longer each day to reach 10 counts in 2 hours. You feel the baby is not moving as he or she usually does. Date: ____________ Movements: ____________ Start time: ____________ Doreatha Martin time: ____________  Date: ____________ Movements: ____________ Start time: ____________ Doreatha Martin time: ____________ Date: ____________ Movements: ____________ Start time: ____________ Doreatha Martin time: ____________ Date: ____________ Movements: ____________ Start time: ____________ Doreatha Martin time: ____________ Date: ____________ Movements: ____________ Start time: ____________ Doreatha Martin time: ____________ Date: ____________ Movements: ____________ Start  time: ____________ Doreatha Martin time: ____________ Date: ____________ Movements: ____________ Start time: ____________ Doreatha Martin time: ____________ Date: ____________ Movements: ____________ Start time: ____________ Doreatha Martin time: ____________  Date: ____________ Movements: ____________ Start time: ____________ Doreatha Martin time: ____________ Date: ____________ Movements: ____________ Start time: ____________ Doreatha Martin time: ____________ Date: ____________ Movements: ____________ Start time: ____________ Doreatha Martin time: ____________ Date: ____________ Movements: ____________ Start time: ____________ Doreatha Martin time: ____________ Date: ____________  Movements: ____________ Start time: ____________ Doreatha Martin time: ____________ Date: ____________ Movements: ____________ Start time: ____________ Doreatha Martin time: ____________ Date: ____________ Movements: ____________ Start time: ____________ Doreatha Martin time: ____________  Date: ____________ Movements: ____________ Start time: ____________ Doreatha Martin time: ____________ Date: ____________ Movements: ____________ Start time: ____________ Doreatha Martin time: ____________ Date: ____________ Movements: ____________ Start time: ____________ Doreatha Martin time: ____________ Date: ____________ Movements: ____________ Start time: ____________ Doreatha Martin time: ____________ Date: ____________ Movements: ____________ Start time: ____________ Doreatha Martin time: ____________ Date: ____________ Movements: ____________ Start time: ____________ Doreatha Martin time: ____________ Date: ____________ Movements: ____________ Start time: ____________ Doreatha Martin time: ____________  Date: ____________ Movements: ____________ Start time: ____________ Doreatha Martin time: ____________ Date: ____________ Movements: ____________ Start time: ____________ Doreatha Martin time: ____________ Date: ____________ Movements: ____________ Start time: ____________ Doreatha Martin time: ____________ Date: ____________ Movements: ____________ Start time: ____________ Doreatha Martin time: ____________ Date: ____________ Movements: ____________ Start time: ____________ Doreatha Martin time: ____________ Date: ____________ Movements: ____________ Start time: ____________ Doreatha Martin time: ____________ Date: ____________ Movements: ____________ Start time: ____________ Doreatha Martin time: ____________  Date: ____________ Movements: ____________ Start time: ____________ Doreatha Martin time: ____________ Date: ____________ Movements: ____________ Start time: ____________ Doreatha Martin time: ____________ Date: ____________ Movements: ____________ Start time: ____________ Doreatha Martin time: ____________ Date: ____________ Movements: ____________ Start time:  ____________ Doreatha Martin time: ____________ Date: ____________ Movements: ____________ Start time: ____________ Doreatha Martin time: ____________ Date: ____________ Movements: ____________ Start time: ____________ Doreatha Martin time: ____________ Date: ____________ Movements: ____________ Start time: ____________ Doreatha Martin time: ____________  Date: ____________ Movements: ____________ Start time: ____________ Doreatha Martin time: ____________ Date: ____________ Movements: ____________ Start time: ____________ Doreatha Martin time: ____________ Date: ____________ Movements: ____________ Start time: ____________ Doreatha Martin time: ____________ Date: ____________ Movements: ____________ Start time: ____________ Doreatha Martin time: ____________ Date: ____________ Movements: ____________ Start time: ____________ Doreatha Martin time: ____________ Date: ____________ Movements: ____________ Start time: ____________ Doreatha Martin time: ____________ Date: ____________ Movements: ____________ Start time: ____________ Doreatha Martin time: ____________  Date: ____________ Movements: ____________ Start time: ____________ Doreatha Martin time: ____________ Date: ____________ Movements: ____________ Start time: ____________ Doreatha Martin time: ____________ Date: ____________ Movements: ____________ Start time: ____________ Doreatha Martin time: ____________ Date: ____________ Movements: ____________ Start time: ____________ Doreatha Martin time: ____________ Date: ____________ Movements: ____________ Start time: ____________ Doreatha Martin time: ____________ Date: ____________ Movements: ____________ Start time: ____________ Doreatha Martin time: ____________ Date: ____________ Movements: ____________ Start time: ____________ Doreatha Martin time: ____________  Date: ____________ Movements: ____________ Start time: ____________ Doreatha Martin time: ____________ Date: ____________ Movements: ____________ Start time: ____________ Doreatha Martin time: ____________ Date: ____________ Movements: ____________ Start time: ____________ Doreatha Martin time: ____________ Date:  ____________ Movements: ____________ Start time: ____________ Doreatha Martin time: ____________ Date: ____________ Movements: ____________ Start time: ____________ Doreatha Martin time: ____________ Date: ____________ Movements: ____________ Start time: ____________ Doreatha Martin time: ____________ Document Released: 12/23/2006 Document Revised: 11/09/2012 Document Reviewed: 09/19/2012 [employer] Patient Information 2014 [employer].

## 2014-02-17 NOTE — Progress Notes (Signed)
Pt. states she has ordered her lunch tray.

## 2014-02-17 NOTE — Discharge Summary (Signed)
Physician Discharge Summary  Patient ID: Earleen NewportZenda T Kamphaus MRN: [id number] DOB/AGE: 1991/10/25 22 y.o.  Admit date: 02/14/2014 Discharge date: 02/17/2014  Admission Diagnoses:  Periorbital abscess; s/p drainage  Discharge Diagnoses: MRSA periorbital abscess s/p drainage Active Problems:   Periorbital cellulitis of right eye   Cellulitis and abscess of face   Discharged Condition: good  Hospital Course: Pt presented to ED with right periorbital swelling and pain.  ENT drained a periorbital abscess and placed patient on Vancomycin and Clindamycin.  Pt did well post procedure.  Abscess culture MRSA positive and sensitive to clindamycin.  Pt also had good glucose control the past 5 days.  Pt stable for discharge with 7 days of oral clindamycin.  Pt has f/u appt in high risk OB clinic on Monday and has appt with ENT next week as well.    Consults: None  Significant Diagnostic Studies: microbiology: wound culture: positive for MRSA  Treatments: antibiotics: vancomycin and clindamycin   Discharge Exam: Blood pressure 128/82, pulse 89, temperature 98.1 F (36.7 C), temperature source Oral, resp. rate 18, height 5\' 1"  (1.549 m), weight 186 lb (84.369 kg), SpO2 100.00%. General appearance: alert, cooperative and no distress Head: slight periorbital swelilng, much improved from admission. GI: soft, non tender, gravid Extremities: extremities normal, atraumatic, no cyanosis or edema  Disposition: 01-Home or Self Care   Future Appointments Provider Department Dept Phone   02/19/2014 10:30 AM [facility] [phone]   02/26/2014 8:00 AM [facility] [phone]       Medication List         ACCU-CHEK FASTCLIX LANCETS Misc  1 Units by Percutaneous route 4 (four) times daily.     acetaminophen 325 MG tablet  Commonly known as:  TYLENOL  Take 650 mg by mouth every 6 (six) hours as needed for mild pain or headache.     clindamycin 300 MG capsule  Commonly known as:  CLEOCIN  Take 2 capsules (600 mg total) by mouth every 8 (eight) hours.     glucose blood test strip  Commonly known as:  ACCU-CHEK SMARTVIEW  Check blood sugars 4x/daily     glyBURIDE 2.5 MG tablet  Commonly known as:  DIABETA  Take 1 tablet (2.5 mg total) by mouth 2 (two) times daily with a meal. Take one tablet by mouth at dinner     insulin aspart 100 UNIT/ML injection  Commonly known as:  novoLOG  Inject 4 Units into the skin 3 (three) times daily with meals.     metFORMIN 1000 MG tablet  Commonly known as:  GLUCOPHAGE  Take 1 tablet (1,000 mg total) by mouth 2 (two) times daily with a meal.     neomycin-bacitracin-polymyxin ointment  Commonly known as:  NEOSPORIN  Apply 1 application topically 2 (two) times daily as needed for wound care. apply to eye     prenatal multivitamin Tabs tablet  Take 1 tablet by mouth daily at 12 noon.         SignedLesly Dukes: Leonetta Mcgivern H. 02/17/2014, 12:11 PM

## 2014-02-17 NOTE — Progress Notes (Signed)
    [facility]    Date of Admission:  02/14/2014   Total days of antibiotics 4        Day 4 of vanco & clinda   ID: Cindy Bruce is a 23 y.o. female with periorbital facial cellulitis  Active Problems:   Periorbital cellulitis of right eye   Cellulitis and abscess of face    Subjective: Afebrile, swelling improving  Medications:  . clindamycin  600 mg Oral 3 times per day  . docusate sodium  100 mg Oral Daily  . glyBURIDE  2.5 mg Oral BID WC  . insulin aspart  0-15 Units Subcutaneous TID WC  . insulin aspart  4 Units Subcutaneous TID WC  . mupirocin ointment   Nasal BID  . prenatal multivitamin  1 tablet Oral Q1200  . vancomycin  1,250 mg Intravenous Q8H    Objective: Vital signs in last 24 hours: Temp:  [97.9 F (36.6 C)-98.9 F (37.2 C)] 98.1 F (36.7 C) (03/14 0836) Pulse Rate:  [89-99] 89 (03/14 0836) Resp:  [18] 18 (03/14 0836) BP: (100-128)/(63-84) 128/82 mmHg (03/14 0836) Weight:  [186 lb (84.369 kg)] 186 lb (84.369 kg) (03/14 1023) Did not examine   Lab Results  Recent Labs  02/14/14 1316  02/16/14 0608 02/17/14 0605  WBC 10.7*  < > 7.5 7.1  HGB 9.8*  < > 8.8* 8.9*  HCT 30.3*  < > 27.7* 27.5*  NA 133*  --   --   --   K 3.8  --   --   --   CL 100  --   --   --   CO2 17*  --   --   --   BUN 6  --   --   --   CREATININE 0.51  --   --   --   < > = values in this interval not displayed.  Microbiology: Wound cx = staph aureus suscepitibilities pending Studies/Results: No results found.   Assessment/Plan: MRSA periorbital/facial cellulitis =can discharge her on clindamycin  600mg  by mouth Q8hr x 10 days. Will not need IV antibiotics after discharge  [facility], [facility] Cell: [phone] Pager: [phone]  02/17/2014, 10:41 AM

## 2014-02-17 NOTE — Progress Notes (Signed)
Pt. drew up and administered Novolog 4 units correctly. Pt. denies concerns or questions with administration. Pt. states she used to give herself insulin 5 times a day.

## 2014-02-17 NOTE — Progress Notes (Signed)
Discussed planned discharge with patient. Pt. States she has taken insulin in the past and is comfortable with self administration. Pt. will administer her lunch dose for assessment. Pt. knows she can go home after her Vancomycin administration is complete and her insulin administration has been assessed. Pt. states she will probably eat around 14:00.

## 2014-02-19 ENCOUNTER — Ambulatory Visit (INDEPENDENT_AMBULATORY_CARE_PROVIDER_SITE_OTHER): Payer: Medicaid Other | Admitting: Family Medicine

## 2014-02-19 ENCOUNTER — Telehealth: Payer: Self-pay | Admitting: *Deleted

## 2014-02-19 ENCOUNTER — Encounter: Payer: Self-pay | Admitting: Family Medicine

## 2014-02-19 VITALS — BP 118/80 | Temp 96.8°F | Wt 182.3 lb

## 2014-02-19 DIAGNOSIS — O24919 Unspecified diabetes mellitus in pregnancy, unspecified trimester: Secondary | ICD-10-CM

## 2014-02-19 DIAGNOSIS — E119 Type 2 diabetes mellitus without complications: Secondary | ICD-10-CM

## 2014-02-19 DIAGNOSIS — O0990 Supervision of high risk pregnancy, unspecified, unspecified trimester: Secondary | ICD-10-CM

## 2014-02-19 NOTE — Progress Notes (Signed)
NST reviewed and reactive. No BS book--reports Bs 87 today GBS today On Abx for MRSA---face is markedly improved. Schedule IOL @ 39 wks

## 2014-02-19 NOTE — Progress Notes (Signed)
US for growth scheduled @ [facility] on 3/23

## 2014-02-19 NOTE — Telephone Encounter (Addendum)
In to see Dr. Shawnie PonsPratt for ob visit this am, needs IOL scheduled- scheduler not available when Taler in clinic.  Scheduled IOL and called [facility]- unable to leave message- heard message voicemail not set up yet.  IOL scheduled for 03/02/14 0730 when she will be 39 weeks.   Pt will be informed of IOL appt upon return 3/20 for NST.

## 2014-02-19 NOTE — Patient Instructions (Signed)
Gestational Diabetes Mellitus Gestational diabetes mellitus, often simply referred to as gestational diabetes, is a type of diabetes that some women develop during pregnancy. In gestational diabetes, the pancreas does not make enough insulin (a hormone), the cells are less responsive to the insulin that is made (insulin resistance), or both.Normally, insulin moves sugars from food into the tissue cells. The tissue cells use the sugars for energy. The lack of insulin or the lack of normal response to insulin causes excess sugars to build up in the blood instead of going into the tissue cells. As a result, high blood sugar (hyperglycemia) develops. The effect of high sugar (glucose) levels can cause many complications.  RISK FACTORS You have an increased chance of developing gestational diabetes if you have a family history of diabetes and also have one or more of the following risk factors:  A body mass index over 30 (obesity).  A previous pregnancy with gestational diabetes.  An older age at the time of pregnancy. If blood glucose levels are kept in the normal range during pregnancy, women can have a healthy pregnancy. If your blood glucose levels are not well controlled, there may be risks to you, your unborn baby (fetus), your labor and delivery, or your newborn baby.  SYMPTOMS  If symptoms are experienced, they are much like symptoms you would normally expect during pregnancy. The symptoms of gestational diabetes include:   Increased thirst (polydipsia).  Increased urination (polyuria).  Increased urination during the night (nocturia).  Weight loss. This weight loss may be rapid.  Frequent, recurring infections.  Tiredness (fatigue).  Weakness.  Vision changes, such as blurred vision.  Fruity smell to your breath.  Abdominal pain. DIAGNOSIS Diabetes is diagnosed when blood glucose levels are increased. Your blood glucose level may be checked by one or more of the following  blood tests:  A fasting blood glucose test. You will not be allowed to eat for at least 8 hours before a blood sample is taken.  A random blood glucose test. Your blood glucose is checked at any time of the day regardless of when you ate.  A hemoglobin A1c blood glucose test. A hemoglobin A1c test provides information about blood glucose control over the previous 3 months.  An oral glucose tolerance test (OGTT). Your blood glucose is measured after you have not eaten (fasted) for 1 3 hours and then after you drink a glucose-containing beverage. Since the hormones that cause insulin resistance are highest at about 24 28 weeks of a pregnancy, an OGTT is usually performed during that time. If you have risk factors for gestational diabetes, your caregiver may test you for gestational diabetes earlier than 24 weeks of pregnancy. TREATMENT   You will need to take diabetes medicine or insulin daily to keep blood glucose levels in the desired range.  You will need to match insulin dosing with exercise and healthy food choices. The treatment goal is to maintain the before meal (preprandial), bedtime, and overnight blood glucose level at 60 99 mg/dL during pregnancy. The treatment goal is to further maintain peak after meal blood sugar (postprandial glucose) level at 100 140 mg/dL. HOME CARE INSTRUCTIONS   Have your hemoglobin A1c level checked twice a year.  Perform daily blood glucose monitoring as directed by your caregiver. It is common to perform frequent blood glucose monitoring.  Monitor urine ketones when you are ill and as directed by your caregiver.  Take your diabetes medicine and insulin as directed by your caregiver to maintain   your blood glucose level in the desired range.  Never run out of diabetes medicine or insulin. It is needed every day.  Adjust insulin based on your intake of carbohydrates. Carbohydrates can raise blood glucose levels but need to be included in your diet.  Carbohydrates provide vitamins, minerals, and fiber which are an essential part of a healthy diet. Carbohydrates are found in fruits, vegetables, whole grains, dairy products, legumes, and foods containing added sugars.    Eat healthy foods. Alternate 3 meals with 3 snacks.  Maintain a healthy weight gain. The usual total expected weight gain varies according to your prepregnancy body mass index (BMI).  Carry a medical alert card or wear your medical alert jewelry.  Carry a 15 gram carbohydrate snack with you at all times to treat low blood glucose (hypoglycemia). Some examples of 15 gram carbohydrate snacks include:  Glucose tablets, 3 or 4   Glucose gel, 15 gram tube  Raisins, 2 tablespoons (24 g)  Jelly beans, 6  Animal crackers, 8  Fruit juice, regular soda, or low fat milk, 4 ounces (120 mL)  Gummy treats, 9    Recognize hypoglycemia. Hypoglycemia during pregnancy occurs with blood glucose levels of 60 mg/dL and below. The risk for hypoglycemia increases when fasting or skipping meals, during or after intense exercise, and during sleep. Hypoglycemia symptoms can include:  Tremors or shakes.  Decreased ability to concentrate.  Sweating.  Increased heart rate.  Headache.  Dry mouth.  Hunger.  Irritability.  Anxiety.  Restless sleep.  Altered speech or coordination.  Confusion.  Treat hypoglycemia promptly. If you are alert and able to safely swallow, follow the 15:15 rule:  Take 15 20 grams of rapid-acting glucose or carbohydrate. Rapid-acting options include glucose gel, glucose tablets, or 4 ounces (120 mL) of fruit juice, regular soda, or low fat milk.  Check your blood glucose level 15 minutes after taking the glucose.   Take 15 20 grams more of glucose if the repeat blood glucose level is still 70 mg/dL or below.  Eat a meal or snack within 1 hour once blood glucose levels return to normal.  Be alert to polyuria and polydipsia which are early  signs of hyperglycemia. An early awareness of hyperglycemia allows for prompt treatment. Treat hyperglycemia as directed by your caregiver.  Engage in at least 30 minutes of physical activity a day or as directed by your caregiver. Ten minutes of physical activity timed 30 minutes after each meal is encouraged to control postprandial blood glucose levels.  Adjust your insulin dosing and food intake as needed if you start a new exercise or sport.  Follow your sick day plan at any time you are unable to eat or drink as usual.  Avoid tobacco and alcohol use.  Follow up with your caregiver regularly.  Follow the advice of your caregiver regarding your prenatal and post-delivery (postpartum) appointments, meal planning, exercise, medicines, vitamins, blood tests, other medical tests, and physical activities.  Perform daily skin and foot care. Examine your skin and feet daily for cuts, bruises, redness, nail problems, bleeding, blisters, or sores.  Brush your teeth and gums at least twice a day and floss at least once a day. Follow up with your dentist regularly.  Schedule an eye exam during the first trimester of your pregnancy or as directed by your caregiver.  Share your diabetes management plan with your workplace or school.  Stay up-to-date with immunizations.  Learn to manage stress.  Obtain ongoing diabetes education and   support as needed. SEEK MEDICAL CARE IF:   You are unable to eat food or drink fluids for more than 6 hours.  You have nausea and vomiting for more than 6 hours.  You have a blood glucose level of 200 mg/dL and you have ketones in your urine.  There is a change in mental status.  You develop vision problems.  You have a persistent headache.  You have upper abdominal pain or discomfort.  You develop an additional serious illness.  You have diarrhea for more than 6 hours.  You have been sick or have had a fever for a couple of days and are not getting  better. SEEK IMMEDIATE MEDICAL CARE IF:   You have difficulty breathing.  You no longer feel the baby moving.  You are bleeding or have discharge from your vagina.  You start having premature contractions or labor. MAKE SURE YOU:  Understand these instructions.  Will watch your condition.  Will get help right away if you are not doing well or get worse. Document Released: 03/01/2001 Document Revised: 03/20/2013 Document Reviewed: 06/21/2012 [employer] Patient Information 2014 [employer].  Breastfeeding Deciding to breastfeed is one of the best choices you can make for you and your baby. A change in hormones during pregnancy causes your breast tissue to grow and increases the number and size of your milk ducts. These hormones also allow proteins, sugars, and fats from your blood supply to make breast milk in your milk-producing glands. Hormones prevent breast milk from being released before your baby is born as well as prompt milk flow after birth. Once breastfeeding has begun, thoughts of your baby, as well as his or her sucking or crying, can stimulate the release of milk from your milk-producing glands.  BENEFITS OF BREASTFEEDING For Your Baby  Your first milk (colostrum) helps your baby's digestive system function better.   There are antibodies in your milk that help your baby fight off infections.   Your baby has a lower incidence of asthma, allergies, and sudden infant death syndrome.   The nutrients in breast milk are better for your baby than infant formulas and are designed uniquely for your baby's needs.   Breast milk improves your baby's brain development.   Your baby is less likely to develop other conditions, such as childhood obesity, asthma, or type 2 diabetes mellitus.  For You   Breastfeeding helps to create a very special bond between you and your baby.   Breastfeeding is convenient. Breast milk is always available at the correct temperature and  costs nothing.   Breastfeeding helps to burn calories and helps you lose the weight gained during pregnancy.   Breastfeeding makes your uterus contract to its prepregnancy size faster and slows bleeding (lochia) after you give birth.   Breastfeeding helps to lower your risk of developing type 2 diabetes mellitus, osteoporosis, and breast or ovarian cancer later in life. SIGNS THAT YOUR BABY IS HUNGRY Early Signs of Hunger  Increased alertness or activity.  Stretching.  Movement of the head from side to side.  Movement of the head and opening of the mouth when the corner of the mouth or cheek is stroked (rooting).  Increased sucking sounds, smacking lips, cooing, sighing, or squeaking.  Hand-to-mouth movements.  Increased sucking of fingers or hands. Late Signs of Hunger  Fussing.  Intermittent crying. Extreme Signs of Hunger Signs of extreme hunger will require calming and consoling before your baby will be able to breastfeed successfully. Do not   wait for the following signs of extreme hunger to occur before you initiate breastfeeding:   Restlessness.  A loud, strong cry.   Screaming. BREASTFEEDING BASICS Breastfeeding Initiation  Find a comfortable place to sit or lie down, with your neck and back well supported.  Place a pillow or rolled up blanket under your baby to bring him or her to the level of your breast (if you are seated). Nursing pillows are specially designed to help support your arms and your baby while you breastfeed.  Make sure that your baby's abdomen is facing your abdomen.   Gently massage your breast. With your fingertips, massage from your chest wall toward your nipple in a circular motion. This encourages milk flow. You may need to continue this action during the feeding if your milk flows slowly.  Support your breast with 4 fingers underneath and your thumb above your nipple. Make sure your fingers are well away from your nipple and your  baby's mouth.   Stroke your baby's lips gently with your finger or nipple.   When your baby's mouth is open wide enough, quickly bring your baby to your breast, placing your entire nipple and as much of the colored area around your nipple (areola) as possible into your baby's mouth.   More areola should be visible above your baby's upper lip than below the lower lip.   Your baby's tongue should be between his or her lower gum and your breast.   Ensure that your baby's mouth is correctly positioned around your nipple (latched). Your baby's lips should create a seal on your breast and be turned out (everted).  It is common for your baby to suck about 2 3 minutes in order to start the flow of breast milk. Latching Teaching your baby how to latch on to your breast properly is very important. An improper latch can cause nipple pain and decreased milk supply for you and poor weight gain in your baby. Also, if your baby is not latched onto your nipple properly, he or she may swallow some air during feeding. This can make your baby fussy. Burping your baby when you switch breasts during the feeding can help to get rid of the air. However, teaching your baby to latch on properly is still the best way to prevent fussiness from swallowing air while breastfeeding. Signs that your baby has successfully latched on to your nipple:    Silent tugging or silent sucking, without causing you pain.   Swallowing heard between every 3 4 sucks.    Muscle movement above and in front of his or her ears while sucking.  Signs that your baby has not successfully latched on to nipple:   Sucking sounds or smacking sounds from your baby while breastfeeding.  Nipple pain. If you think your baby has not latched on correctly, slip your finger into the corner of your baby's mouth to break the suction and place it between your baby's gums. Attempt breastfeeding initiation again. Signs of Successful  Breastfeeding Signs from your baby:   A gradual decrease in the number of sucks or complete cessation of sucking.   Falling asleep.   Relaxation of his or her body.   Retention of a small amount of milk in his or her mouth.   Letting go of your breast by himself or herself. Signs from you:  Breasts that have increased in firmness, weight, and size 1 3 hours after feeding.   Breasts that are softer immediately after breastfeeding.    Increased milk volume, as well as a change in milk consistency and color by the 5th day of breastfeeding.   Nipples that are not sore, cracked, or bleeding. Signs That Your Baby is Getting Enough Milk  Wetting at least 3 diapers in a 24-hour period. The urine should be clear and pale yellow by age 5 days.  At least 3 stools in a 24-hour period by age 5 days. The stool should be soft and yellow.  At least 3 stools in a 24-hour period by age 7 days. The stool should be seedy and yellow.  No loss of weight greater than 10% of birth weight during the first 3 days of age.  Average weight gain of 4 7 ounces (120 210 mL) per week after age 4 days.  Consistent daily weight gain by age 5 days, without weight loss after the age of 2 weeks. After a feeding, your baby may spit up a small amount. This is common. BREASTFEEDING FREQUENCY AND DURATION Frequent feeding will help you make more milk and can prevent sore nipples and breast engorgement. Breastfeed when you feel the need to reduce the fullness of your breasts or when your baby shows signs of hunger. This is called "breastfeeding on demand." Avoid introducing a pacifier to your baby while you are working to establish breastfeeding (the first 4 6 weeks after your baby is born). After this time you may choose to use a pacifier. Research has shown that pacifier use during the first year of a baby's life decreases the risk of sudden infant death syndrome (SIDS). Allow your baby to feed on each breast as  long as he or she wants. Breastfeed until your baby is finished feeding. When your baby unlatches or falls asleep while feeding from the first breast, offer the second breast. Because newborns are often sleepy in the first few weeks of life, you may need to awaken your baby to get him or her to feed. Breastfeeding times will vary from baby to baby. However, the following rules can serve as a guide to help you ensure that your baby is properly fed:  Newborns (babies 4 weeks of age or younger) may breastfeed every 1 3 hours.  Newborns should not go longer than 3 hours during the day or 5 hours during the night without breastfeeding.  You should breastfeed your baby a minimum of 8 times in a 24-hour period until you begin to introduce solid foods to your baby at around 6 months of age. BREAST MILK PUMPING Pumping and storing breast milk allows you to ensure that your baby is exclusively fed your breast milk, even at times when you are unable to breastfeed. This is especially important if you are going back to work while you are still breastfeeding or when you are not able to be present during feedings. Your lactation consultant can give you guidelines on how long it is safe to store breast milk.  A breast pump is a machine that allows you to pump milk from your breast into a sterile bottle. The pumped breast milk can then be stored in a refrigerator or freezer. Some breast pumps are operated by hand, while others use electricity. Ask your lactation consultant which type will work best for you. Breast pumps can be purchased, but some hospitals and breastfeeding support groups lease breast pumps on a monthly basis. A lactation consultant can teach you how to hand express breast milk, if you prefer not to use a pump.  CARING FOR   YOUR BREASTS WHILE YOU BREASTFEED Nipples can become dry, cracked, and sore while breastfeeding. The following recommendations can help keep your breasts moisturized and  healthy:  Avoid using soap on your nipples.   Wear a supportive bra. Although not required, special nursing bras and tank tops are designed to allow access to your breasts for breastfeeding without taking off your entire bra or top. Avoid wearing underwire style bras or extremely tight bras.  Air dry your nipples for 3 4minutes after each feeding.   Use only cotton bra pads to absorb leaked breast milk. Leaking of breast milk between feedings is normal.   Use lanolin on your nipples after breastfeeding. Lanolin helps to maintain your skin's normal moisture barrier. If you use pure lanolin you do not need to wash it off before feeding your baby again. Pure lanolin is not toxic to your baby. You may also hand express a few drops of breast milk and gently massage that milk into your nipples and allow the milk to air dry. In the first few weeks after giving birth, some women experience extremely full breasts (engorgement). Engorgement can make your breasts feel heavy, warm, and tender to the touch. Engorgement peaks within 3 5 days after you give birth. The following recommendations can help ease engorgement:  Completely empty your breasts while breastfeeding or pumping. You may want to start by applying warm, moist heat (in the shower or with warm water-soaked hand towels) just before feeding or pumping. This increases circulation and helps the milk flow. If your baby does not completely empty your breasts while breastfeeding, pump any extra milk after he or she is finished.  Wear a snug bra (nursing or regular) or tank top for 1 2 days to signal your body to slightly decrease milk production.  Apply ice packs to your breasts, unless this is too uncomfortable for you.  Make sure that your baby is latched on and positioned properly while breastfeeding. If engorgement persists after 48 hours of following these recommendations, contact your health care provider or a lactation consultant. OVERALL  HEALTH CARE RECOMMENDATIONS WHILE BREASTFEEDING  Eat healthy foods. Alternate between meals and snacks, eating 3 of each per day. Because what you eat affects your breast milk, some of the foods may make your baby more irritable than usual. Avoid eating these foods if you are sure that they are negatively affecting your baby.  Drink milk, fruit juice, and water to satisfy your thirst (about 10 glasses a day).   Rest often, relax, and continue to take your prenatal vitamins to prevent fatigue, stress, and anemia.  Continue breast self-awareness checks.  Avoid chewing and smoking tobacco.  Avoid alcohol and drug use. Some medicines that may be harmful to your baby can pass through breast milk. It is important to ask your health care provider before taking any medicine, including all over-the-counter and prescription medicine as well as vitamin and herbal supplements. It is possible to become pregnant while breastfeeding. If birth control is desired, ask your health care provider about options that will be safe for your baby. SEEK MEDICAL CARE IF:   You feel like you want to stop breastfeeding or have become frustrated with breastfeeding.  You have painful breasts or nipples.  Your nipples are cracked or bleeding.  Your breasts are red, tender, or warm.  You have a swollen area on either breast.  You have a fever or chills.  You have nausea or vomiting.  You have drainage other than breast   milk from your nipples.  Your breasts do not become full before feedings by the 5th day after you give birth.  You feel sad and depressed.  Your baby is too sleepy to eat well.  Your baby is having trouble sleeping.   Your baby is wetting less than 3 diapers in a 24-hour period.  Your baby has less than 3 stools in a 24-hour period.  Your baby's skin or the white part of his or her eyes becomes yellow.   Your baby is not gaining weight by 5 days of age. SEEK IMMEDIATE MEDICAL CARE  IF:   Your baby is overly tired (lethargic) and does not want to wake up and feed.  Your baby develops an unexplained fever. Document Released: 11/23/2005 Document Revised: 07/26/2013 Document Reviewed: 05/17/2013 [employer] Patient Information 2014 [employer].  

## 2014-02-19 NOTE — Progress Notes (Signed)
P= 90 C/o of intermittent lower abdominal/pelvic pressure and braxton hicks.

## 2014-02-21 LAB — CULTURE, BETA STREP (GROUP B ONLY)

## 2014-02-22 ENCOUNTER — Encounter: Payer: Self-pay | Admitting: Family Medicine

## 2014-02-23 ENCOUNTER — Other Ambulatory Visit: Payer: Medicaid Other

## 2014-02-26 ENCOUNTER — Inpatient Hospital Stay (HOSPITAL_COMMUNITY): Payer: Medicaid Other | Admitting: Anesthesiology

## 2014-02-26 ENCOUNTER — Encounter (HOSPITAL_COMMUNITY): Payer: Self-pay | Admitting: *Deleted

## 2014-02-26 ENCOUNTER — Ambulatory Visit (HOSPITAL_COMMUNITY): Payer: Medicaid Other

## 2014-02-26 ENCOUNTER — Other Ambulatory Visit: Payer: Medicaid Other

## 2014-02-26 ENCOUNTER — Inpatient Hospital Stay (HOSPITAL_COMMUNITY)
Admission: AD | Admit: 2014-02-26 | Discharge: 2014-02-27 | DRG: 774 | Disposition: A | Discharge status: Home / Self Care | Payer: Medicaid Other | Source: Ambulatory Visit | Attending: Obstetrics & Gynecology | Admitting: Obstetrics & Gynecology

## 2014-02-26 ENCOUNTER — Encounter (HOSPITAL_COMMUNITY): Payer: Medicaid Other | Admitting: Anesthesiology

## 2014-02-26 DIAGNOSIS — B951 Streptococcus, group B, as the cause of diseases classified elsewhere: Secondary | ICD-10-CM

## 2014-02-26 DIAGNOSIS — Z794 Long term (current) use of insulin: Secondary | ICD-10-CM

## 2014-02-26 DIAGNOSIS — IMO0001 Reserved for inherently not codable concepts without codable children: Secondary | ICD-10-CM

## 2014-02-26 DIAGNOSIS — O24919 Unspecified diabetes mellitus in pregnancy, unspecified trimester: Secondary | ICD-10-CM

## 2014-02-26 DIAGNOSIS — O0990 Supervision of high risk pregnancy, unspecified, unspecified trimester: Secondary | ICD-10-CM

## 2014-02-26 DIAGNOSIS — O360990 Maternal care for other rhesus isoimmunization, unspecified trimester, not applicable or unspecified: Secondary | ICD-10-CM | POA: Diagnosis present

## 2014-02-26 DIAGNOSIS — O2340 Unspecified infection of urinary tract in pregnancy, unspecified trimester: Secondary | ICD-10-CM

## 2014-02-26 DIAGNOSIS — L03211 Cellulitis of face: Secondary | ICD-10-CM | POA: Diagnosis present

## 2014-02-26 DIAGNOSIS — Z87891 Personal history of nicotine dependence: Secondary | ICD-10-CM

## 2014-02-26 DIAGNOSIS — L0201 Cutaneous abscess of face: Secondary | ICD-10-CM

## 2014-02-26 DIAGNOSIS — O2432 Unspecified pre-existing diabetes mellitus in childbirth: Secondary | ICD-10-CM

## 2014-02-26 DIAGNOSIS — O09899 Supervision of other high risk pregnancies, unspecified trimester: Secondary | ICD-10-CM

## 2014-02-26 DIAGNOSIS — E119 Type 2 diabetes mellitus without complications: Secondary | ICD-10-CM | POA: Diagnosis present

## 2014-02-26 LAB — CBC
HEMATOCRIT: 31 % — AB (ref 36.0–46.0)
Hemoglobin: 9.9 g/dL — ABNORMAL LOW (ref 12.0–15.0)
MCH: 22.4 pg — ABNORMAL LOW (ref 26.0–34.0)
MCHC: 31.9 g/dL (ref 30.0–36.0)
MCV: 70.1 fL — ABNORMAL LOW (ref 78.0–100.0)
Platelets: 288 10*3/uL (ref 150–400)
RBC: 4.42 MIL/uL (ref 3.87–5.11)
RDW: 18.1 % — ABNORMAL HIGH (ref 11.5–15.5)
WBC: 8 10*3/uL (ref 4.0–10.5)

## 2014-02-26 LAB — TYPE AND SCREEN
ABO/RH(D): O NEG
ANTIBODY SCREEN: NEGATIVE

## 2014-02-26 LAB — GLUCOSE, CAPILLARY
Glucose-Capillary: 110 mg/dL — ABNORMAL HIGH (ref 70–99)
Glucose-Capillary: 111 mg/dL — ABNORMAL HIGH (ref 70–99)

## 2014-02-26 LAB — POCT FERN TEST: POCT FERN TEST: POSITIVE

## 2014-02-26 LAB — RPR: RPR: NONREACTIVE

## 2014-02-26 MED ORDER — CITRIC ACID-SODIUM CITRATE 334-500 MG/5ML PO SOLN: 30.0000 mL | ORAL | Status: DC | PRN

## 2014-02-26 MED ORDER — SENNOSIDES-DOCUSATE SODIUM 8.6-50 MG PO TABS
2.0000 | ORAL_TABLET | ORAL | Status: DC
  Administered 2014-02-26: 2 via ORAL
  Filled 2014-02-26: qty 2

## 2014-02-26 MED ORDER — LIDOCAINE HCL (PF) 1 % IJ SOLN
INTRAMUSCULAR | Status: DC | PRN
  Administered 2014-02-26 (×3): 5 mL

## 2014-02-26 MED ORDER — ZOLPIDEM TARTRATE 5 MG PO TABS: 5.0000 mg | ORAL_TABLET | Freq: Every evening | ORAL | Status: DC | PRN

## 2014-02-26 MED ORDER — OXYTOCIN BOLUS FROM INFUSION
500.0000 mL | INTRAVENOUS | Status: DC
  Administered 2014-02-26: 500 mL via INTRAVENOUS

## 2014-02-26 MED ORDER — LACTATED RINGERS IV SOLN
INTRAVENOUS | Status: DC
  Administered 2014-02-26 (×2): via INTRAVENOUS

## 2014-02-26 MED ORDER — EPHEDRINE 5 MG/ML INJ
10.0000 mg | INTRAVENOUS | Status: DC | PRN
  Filled 2014-02-26: qty 4
  Filled 2014-02-26: qty 2

## 2014-02-26 MED ORDER — ONDANSETRON HCL 4 MG PO TABS: 4.0000 mg | ORAL_TABLET | ORAL | Status: DC | PRN

## 2014-02-26 MED ORDER — SIMETHICONE 80 MG PO CHEW: 80.0000 mg | CHEWABLE_TABLET | ORAL | Status: DC | PRN

## 2014-02-26 MED ORDER — IBUPROFEN 600 MG PO TABS
600.0000 mg | ORAL_TABLET | Freq: Four times a day (QID) | ORAL | Status: DC
  Administered 2014-02-26 – 2014-02-27 (×5): 600 mg via ORAL
  Filled 2014-02-26 (×5): qty 1

## 2014-02-26 MED ORDER — DIBUCAINE 1 % RE OINT: 1.0000 "application " | TOPICAL_OINTMENT | RECTAL | Status: DC | PRN

## 2014-02-26 MED ORDER — IBUPROFEN 600 MG PO TABS
600.0000 mg | ORAL_TABLET | Freq: Four times a day (QID) | ORAL | Status: DC | PRN
  Administered 2014-02-26: 600 mg via ORAL
  Filled 2014-02-26: qty 1

## 2014-02-26 MED ORDER — PHENYLEPHRINE 40 MCG/ML (10ML) SYRINGE FOR IV PUSH (FOR BLOOD PRESSURE SUPPORT)
80.0000 ug | PREFILLED_SYRINGE | INTRAVENOUS | Status: DC | PRN
  Filled 2014-02-26: qty 2
  Filled 2014-02-26: qty 10

## 2014-02-26 MED ORDER — OXYTOCIN 40 UNITS IN LACTATED RINGERS INFUSION - SIMPLE MED
62.5000 mL/h | INTRAVENOUS | Status: DC
  Filled 2014-02-26: qty 1000

## 2014-02-26 MED ORDER — ACETAMINOPHEN 325 MG PO TABS: 650.0000 mg | ORAL_TABLET | ORAL | Status: DC | PRN

## 2014-02-26 MED ORDER — LACTATED RINGERS IV SOLN
500.0000 mL | Freq: Once | INTRAVENOUS | Status: AC
  Administered 2014-02-26: 500 mL via INTRAVENOUS

## 2014-02-26 MED ORDER — DIPHENHYDRAMINE HCL 25 MG PO CAPS: 25.0000 mg | ORAL_CAPSULE | Freq: Four times a day (QID) | ORAL | Status: DC | PRN

## 2014-02-26 MED ORDER — TETANUS-DIPHTH-ACELL PERTUSSIS 5-2.5-18.5 LF-MCG/0.5 IM SUSP: 0.5000 mL | Freq: Once | INTRAMUSCULAR | Status: DC

## 2014-02-26 MED ORDER — FENTANYL 2.5 MCG/ML BUPIVACAINE 1/10 % EPIDURAL INFUSION (WH - ANES)
INTRAMUSCULAR | Status: DC | PRN
  Administered 2014-02-26: 14 mL/h via EPIDURAL

## 2014-02-26 MED ORDER — PHENYLEPHRINE 40 MCG/ML (10ML) SYRINGE FOR IV PUSH (FOR BLOOD PRESSURE SUPPORT)
80.0000 ug | PREFILLED_SYRINGE | INTRAVENOUS | Status: DC | PRN
  Filled 2014-02-26: qty 2

## 2014-02-26 MED ORDER — LACTATED RINGERS IV SOLN: 500.0000 mL | INTRAVENOUS | Status: DC | PRN

## 2014-02-26 MED ORDER — DIPHENHYDRAMINE HCL 50 MG/ML IJ SOLN: 12.5000 mg | INTRAMUSCULAR | Status: DC | PRN

## 2014-02-26 MED ORDER — OXYCODONE-ACETAMINOPHEN 5-325 MG PO TABS: 1.0000 | ORAL_TABLET | ORAL | Status: DC | PRN

## 2014-02-26 MED ORDER — LIDOCAINE HCL (PF) 1 % IJ SOLN
30.0000 mL | INTRAMUSCULAR | Status: DC | PRN
  Filled 2014-02-26: qty 30

## 2014-02-26 MED ORDER — FLEET ENEMA 7-19 GM/118ML RE ENEM: 1.0000 | ENEMA | RECTAL | Status: DC | PRN

## 2014-02-26 MED ORDER — FENTANYL 2.5 MCG/ML BUPIVACAINE 1/10 % EPIDURAL INFUSION (WH - ANES)
14.0000 mL/h | INTRAMUSCULAR | Status: DC | PRN
  Filled 2014-02-26: qty 125

## 2014-02-26 MED ORDER — WITCH HAZEL-GLYCERIN EX PADS: 1.0000 "application " | MEDICATED_PAD | CUTANEOUS | Status: DC | PRN

## 2014-02-26 MED ORDER — EPHEDRINE 5 MG/ML INJ
10.0000 mg | INTRAVENOUS | Status: DC | PRN
  Filled 2014-02-26: qty 2

## 2014-02-26 MED ORDER — LANOLIN HYDROUS EX OINT: TOPICAL_OINTMENT | CUTANEOUS | Status: DC | PRN

## 2014-02-26 MED ORDER — ONDANSETRON HCL 4 MG/2ML IJ SOLN: 4.0000 mg | Freq: Four times a day (QID) | INTRAMUSCULAR | Status: DC | PRN

## 2014-02-26 MED ORDER — ONDANSETRON HCL 4 MG/2ML IJ SOLN: 4.0000 mg | INTRAMUSCULAR | Status: DC | PRN

## 2014-02-26 MED ORDER — BENZOCAINE-MENTHOL 20-0.5 % EX AERO
1.0000 "application " | INHALATION_SPRAY | CUTANEOUS | Status: DC | PRN
  Administered 2014-02-27: 1 via TOPICAL
  Filled 2014-02-26: qty 56

## 2014-02-26 MED ORDER — METFORMIN HCL 500 MG PO TABS
1000.0000 mg | ORAL_TABLET | Freq: Two times a day (BID) | ORAL | Status: DC
  Administered 2014-02-26 – 2014-02-27 (×3): 1000 mg via ORAL
  Filled 2014-02-26 (×4): qty 2

## 2014-02-26 MED ORDER — PRENATAL MULTIVITAMIN CH
1.0000 | ORAL_TABLET | Freq: Every day | ORAL | Status: DC
  Administered 2014-02-27: 1 via ORAL
  Filled 2014-02-26: qty 1

## 2014-02-26 NOTE — Consult Note (Signed)
Neonatology Note:  Attendance at Delivery:  I was asked by Dr. Beck to attend this NSVD at term due to moderate meconium and a history of abnormal fetal cardiac ultrasound. The mother is a G3P2 O neg, GBS neg with Class B DM. Antenatal ultrasound has shown a cardiac defect in the infant, but I cannot find results of these exams or of a cardiology consultation in the chart. ROM 11 hours prior to delivery, fluid with moderate meconium. Infant vigorous with good spontaneous cry and tone. Needed only bulb suctioning for some thin green fluid. The baby was very vigorous and pinked up very quickly. We placed a pulse oximeter on him and the O2 saturation was 98% in room air at 5 minutes of life. Ap 8/9. Lungs clear to ausc in DR. No cardiac murmurs heard, perfusion excellent. 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 I spoke with both parents about all the above findings. I believe it is safe for the baby to remain on central nursery status to get an echocardiogram there. To CN to care of Pediatrician.  Reed Eifert C. Tenessa Marsee, MD  

## 2014-02-26 NOTE — Anesthesia Postprocedure Evaluation (Signed)
Anesthesia Post Note  Patient: Cindy NewportZenda T Bruce  Procedure(s) Performed: * No procedures listed *  Anesthesia type: Epidural  Patient location: Mother/Baby  Post pain: Pain level controlled  Post assessment: Post-op Vital signs reviewed  Last Vitals:  Filed Vitals:   02/26/14 1529  BP: 120/75  Pulse: 69  Temp: 36.6 C  Resp: 18    Post vital signs: Reviewed  Level of consciousness:alert  Complications: No apparent anesthesia complications

## 2014-02-26 NOTE — Anesthesia Preprocedure Evaluation (Signed)
Anesthesia Evaluation  Patient identified by MRN, date of birth, ID band Patient awake    Reviewed: Allergy & Precautions, H&P , Patient's Chart, lab work & pertinent test results  Airway Mallampati: III TM Distance: >3 FB Neck ROM: full    Dental no notable dental hx. (+) Teeth Intact   Pulmonary neg pulmonary ROS, asthma , former smoker,  breath sounds clear to auscultation  Pulmonary exam normal       Cardiovascular negative cardio ROS  Rhythm:regular Rate:Normal     Neuro/Psych negative neurological ROS  negative psych ROS   GI/Hepatic negative GI ROS, Neg liver ROS,   Endo/Other  diabetes, Well Controlled, Type 1, Insulin DependentObesity  Renal/GU negative Renal ROS  negative genitourinary   Musculoskeletal   Abdominal Normal abdominal exam  (+)   Peds  Hematology negative hematology ROS (+)   Anesthesia Other Findings   Reproductive/Obstetrics (+) Pregnancy                           Anesthesia Physical  Anesthesia Plan  ASA: II  Anesthesia Plan: Epidural   Post-op Pain Management:    Induction:   Airway Management Planned:   Additional Equipment:   Intra-op Plan:   Post-operative Plan:   Informed Consent: I have reviewed the patients History and Physical, chart, labs and discussed the procedure including the risks, benefits and alternatives for the proposed anesthesia with the patient or authorized representative who has indicated his/her understanding and acceptance.     Plan Discussed with: Anesthesiologist  Anesthesia Plan Comments:         Anesthesia Quick Evaluation

## 2014-02-26 NOTE — H&P (Signed)
Earleen NewportZenda T Cannell is a 23 y.o. female presenting for SOL. Her water broke at approximately 2pm, she's had contractions for the same period of time.  She was scheduled for IOL 3.27.15 for gestation complicated by Class B DM and orbital cellulitis. Fetal echo required due to RV dysfunction and RV dilation.   LOF about 2pm, ctx for the same period of time. Denies vb. +FM. No ShOB, CP, N/V.    History OB History   Grav Para Term Preterm Abortions TAB SAB Ect Mult Living   3 2 2  0 0 0 0 0 0 2     Past Medical History  Diagnosis Date  . Asthma   . 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 Medical history non-contributory    Past Surgical History  Procedure Laterality Date  . Tooth extraction     Family History: family history includes Diabetes in her maternal grandmother and mother. Social History:  reports that she has quit smoking. Her smoking use included Cigarettes. She smoked 0.25 packs per day. She quit smokeless tobacco use about 17 months ago. She reports that she does not drink alcohol or use illicit drugs.   Prenatal Transfer Tool  Maternal Diabetes: Yes:  Diabetes Type:  Insulin/Medication controlled Genetic Screening: Declined Maternal Ultrasounds/Referrals: Abnormal:  Findings:   Fetal Heart Anomalies Fetal Ultrasounds or other Referrals:  Fetal echo, Other:  Pediatric cardiologist Carma Leaven(Gregory H Tatum) recommends initial neonatal care be NICU with post-natal echocardiogram w/in 24 hours to rule out coarctication of aorta.  Maternal Substance Abuse:  Yes:  Type: Smoker Significant Maternal Medications:  Meds include: Other:  RhoGam, Glyburide, Insulin, Metformin Significant Maternal Lab Results:  Lab values include: Group B Strep negative, Rh negative Other Comments:  Meconium stained amniotic fluid  ROS See HPI  Dilation: 3.5 Effacement (%): 40 Station: -1 Exam by:: Hall MD Blood pressure 145/82, pulse 95, temperature 97.7 F (36.5 C), temperature  source Oral, resp. rate 18, height 5\' 1"  (1.549 m), weight 83.008 kg (183 lb), SpO2 100.00%. Maternal Exam:  Uterine Assessment: Contraction strength is moderate.  Contraction duration is 70 seconds. Contraction frequency is regular.   Introitus: Amniotic fluid character: meconium stained.  Cervix: Cervix evaluated by digital exam.     Fetal Exam Fetal Monitor Review: Mode: ultrasound.   Variability: moderate (6-25 bpm).   Pattern: variable decelerations.    Fetal State Assessment: Category II - tracings are indeterminate.     Physical Exam  Prenatal labs: ABO, Rh: O/NEG/-- (10/06 1122) Antibody: NEG (01/26 1152) Rubella: 0.83 (10/06 1122) RPR: NON REAC (01/26 1150)  HBsAg: NEGATIVE (10/06 1122)  HIV: NON REACTIVE (01/26 1150)  GBS:   negative  Assessment/Plan: Earleen NewportZenda T Spinney is a 23 y.o. [id number] @ 5722w3d presents for SOL.  #Labor: Progressing. Expectant management #Pain: Epidural #FWB: Category II; variable decels may be tracing artifact, but continue to monitor #ID:  GBS neg #Feeding: Breast/Bottle #MOC: BTL # DM : Monitor CBGs   Michaelene SongHall, Jonathan C 02/26/2014, 5:28 AM

## 2014-02-26 NOTE — MAU Note (Signed)
Patient states she thinks her water broke and she is having contractions every 3 minutes. Started leaking before 230 am.

## 2014-02-26 NOTE — Anesthesia Procedure Notes (Signed)
Epidural Patient location during procedure: OB  Staffing Anesthesiologist: Phillips GroutARIGNAN, Ronnald Shedden Performed by: anesthesiologist   Preanesthetic Checklist Completed: patient identified, site marked, surgical consent, pre-op evaluation, timeout performed, IV checked, risks and benefits discussed and monitors and equipment checked  Epidural Patient position: sitting Prep: ChloraPrep Patient monitoring: heart rate, blood pressure and continuous pulse ox Approach: right paramedian Location: L4-L5 Injection technique: LOR saline  Needle:  Needle type: Tuohy  Needle gauge: 17 G Needle length: 9 cm and 9 Needle insertion depth: 6 cm Catheter type: closed end flexible Catheter size: 20 Guage Catheter at skin depth: 10 cm Test dose: negative  Assessment Events: blood not aspirated, injection not painful, no injection resistance, negative IV test and no paresthesia  Additional Notes   Patient tolerated the insertion well without complications.

## 2014-02-27 MED ORDER — METFORMIN HCL 1000 MG PO TABS: 1000.0000 mg | ORAL_TABLET | Freq: Two times a day (BID) | ORAL | Status: DC

## 2014-02-27 MED ORDER — IBUPROFEN 600 MG PO TABS: 600.0000 mg | ORAL_TABLET | Freq: Four times a day (QID) | ORAL | Status: DC

## 2014-02-27 NOTE — Discharge Summary (Signed)
Obstetric Discharge Summary Reason for Admission: induction of labor Prenatal Procedures: ultrasound and peri-orbital cellulitis Intrapartum Procedures: spontaneous vaginal delivery Postpartum Procedures: none Complications-Operative and Postpartum: none Hemoglobin  Date Value Ref Range Status  02/26/2014 9.9* 12.0 - 15.0 g/dL Final     HCT  Date Value Ref Range Status  02/26/2014 31.0* 36.0 - 46.0 % Final    Physical Exam:  General: alert, cooperative and no distress Lochia: appropriate Uterine Fundus: firm Incision: N/A  DVT Evaluation: No evidence of DVT, no calf or cord tenderness.   Discharge Diagnoses: Term Pregnancy-delivered  Discharge Information: Date: 02/27/2014 Activity: pelvic rest Diet: consistent carbohydrate Medications: Ibuprofen Condition: stable Instructions: refer to practice specific booklet Discharge to: home Follow-up Information   [facility] (To schedule BTL and general follow-up)    Contact information:   [address] [place] [id number] [phone]     Delivery Note At 12:31 PM a healthy female was delivered via Vaginal, Spontaneous Delivery (Presentation: Left Occiput Anterior).  APGAR: 8, 9; weight .   Placenta status: Intact, Spontaneous.  Cord: 3 vessels with the following complications: None.  Cord pH: NA  Anesthesia: Epidural  Episiotomy: None Lacerations: Superficial periurethral - hemostatic Suture Repair: None Est. Blood Loss (250 mL):   Upon arrival patient was complete and pushing. She pushed with good maternal effort to deliver a healthy boy in meconium stained fluids. Baby delivered without difficulty after nuchal x 1 was reduced. NICU was present due to Fetal Echo showing RV dysfunction and dilation. Due to decreased fetal tone on delivery, umbilical cord was clamped and baby taken to warmer for evaluation. Placenta delivered intact with 3V cord. Pitocin was started and  uterus massaged until bleeding slowed.  Vaginal canal and perineum was inspected and revealed superficial periurethral and superficial vaginal introitus lacerations which were all hemostatic and not requiring suturing. Counts of sharps, instruments, and lap pads were all correct.    Mom to postpartum.  Baby to Couplet care / Skin to Skin.  Cindy Bruce 02/26/2014, 12:50 PM   I was present for and supervised the delivery of this newborn. I agree with above documentation.   Cindy HavenBECK, Cindy Legore L, MD  Brief Hospital Course: Cindy Bruce is a [id number] who underwent  IOL for Class B diabetes had SVD @ 4351w3d without complications.  For further details, please refer to delivery/operative note.  Baby with possible RV dysfunction so NICU was present at delivery but not needed. Uncomplicated postpartum course. At time of discharge, pain was controlled on oral pain medications; she had appropriate lochia and was ambulating, voiding without difficulty, tolerating regular diet and passing flatus. Glucose within acceptable limits on metformin. She was deemed stable for discharge to home. [facility].Can be discharged to home or baby's room after seen again by lactation and getting BTL papers signed.   Newborn Data: Live born female  Birth Weight: 6 lb 12.2 oz (3067 g) APGAR: 8, 9  Home with mother.  Cindy Bruce, Cindy Bruce 02/27/2014, 7:45 AM  I have seen and examined this patient and agree with above documentation in the resident's note.   Cindy Bruce, M.D. [facility] Fellow 02/27/2014 8:54 AM

## 2014-02-27 NOTE — Progress Notes (Signed)
Informed pt. That she was officially discharged.  Cant send baby to nursery. Must provide all care. Informed pt that she can still order meals. Pt. Voiced understanding!!!!!!!!!!!

## 2014-02-27 NOTE — Discharge Instructions (Signed)
Vaginal Delivery Care After Refer to this sheet in the next few weeks. These discharge instructions provide you with information on caring for yourself after delivery. Your caregiver may also give you specific instructions. Your treatment has been planned according to the most current medical practices available, but problems sometimes occur. Call your caregiver if you have any problems or questions after you go home. HOME CARE INSTRUCTIONS Take over-the-counter or prescription medicines only as directed by your caregiver or pharmacist.Breast Pumping Tips Pumping your breast milk is a good way to stimulate milk production and have a steady supply of breast milk for your infant. Pumping is most helpful during your infant's growth spurts, when involving dad or a family member, or when you are away. There are several types of pumps available. They can be purchased at a baby or maternity store. You can begin pumping soon after delivery, but some experts believe that you should wait about four weeks to give your infant a bottle. In general, the more you breastfeed or pump, the more milk you will have for your infant. It is also important to take good care of yourself. This will reduce stress and help your body to create a healthy supply of milk. Your caregiver or lactation consultant can give you the information and support you need in your efforts to breastfeed your infant. PUMPING BREAST MILK  Follow the tips below for successful breast pumping. Take care of yourself.  Drink enough water or fluids to keep urine clear or pale yellow. You may notice a thirsty feeling while breastfeeding. This is because your body needs more water to make breast milk. Keep a large water bottle handy. Make healthy drink choices such as unsweetened fruit juice, milk and water. Limit soda, coffee, and alcohol (wait 2 hours to feed or pump if you have an alcoholic drink.)  Eat a healthy, well-balanced diet rich in fruits,  vegetables, and whole grains.  Exercise as recommended by your caregiver.  Get plenty of sleep. Sleep when your infant sleeps. Ask friends and family for help if you need time to nap or rest.  Do not smoke. Smoking can lower your milk supply and harm your infant. If you need help quitting, ask your caregiver for a program recommendation.  Ask your caregiver about birth control options. Birth control pills may lower your milk supply. You may be advised to use condoms or other forms of birth control. Relax and pump Stimulating your let-down reflex is the key to successful and effective pumping. This makes the milk in all parts of the breast flow more freely.   It is easier to pump breast milk (and breastfeed) while you are relaxed. Find techniques that work for you. Quiet private spaces, breast massage, soothing heat placed on the breast, music, and pictures or a tape recording of your infant may help you to relax and "let down" your milk. If you have difficulty with your let down, try smelling one of your infant's blankets or an item of clothing he or she has worn while you are pumping.  When pumping, place the special suction cup (flange) directly over the nipple. It may be uncomfortable and cause nipple damage if it is not placed properly or is the wrong size. Applying a small amount of purified or modified lanolin to your nipple and the areola may help increase your comfort level. Also, you can change the speed and suction of many electric pumps to your comfort level. Your caregiver or lactation consultant can  help you with this.  If pumping continues to be painful, or you feel you are not getting very much milk when you pump, you may need a different type of pump. A lactation consultant can help you determine if this is the case.  If you are with your infant, feed him or her on demand and try pumping after each feeding. This will boost your production, even if milk does not come out. You may not  be able to pump much milk at first, but keep up the routine, and this will change.  If you are working or away from your infant for several hours, try pumping for about 15 minutes every 2 to 3 hours. Pump both breasts at the same time if you can.  If your infant has a formula feeding, make sure you pump your milk around the same time to maintain your supply.  Begin pumping breast milk a few weeks before you return to work. This will help you develop techniques that work for you and will be able to store extra milk.  Find a source of breastfeeding information that works well for you. TIPS FOR STORING BREAST MILK  Store breast milk in a sealable sterile bag, jar, or container provided with your pumping supplies.  Store milk in small amounts close to what your infant is drinking at each feeding.  Cool pumped milk in a refrigerator or cooler. Pumped milk can last at the back of the refrigerator for 3 to 8 days.  Place cooled milk at the back of the freezer for up to 3 months.  Thaw the milk in its container or bag in warm water up to 24 hours in advance. Do not use a microwave to thaw or heat milk. Do not refreeze the milk after it has been thawed.  Breast milk is safe to drink when left at room temperature (mid 70s or colder) for 4 to 8 hours. After that, throw it away.  Milk fat can separate and look funny. The color can vary slightly from day to day. This is normal. Always shake the milk before using it to mix the fat with the more watery portion. SEEK MEDICAL CARE IF:   You are having trouble pumping or feeding your infant.  You are concerned that you are not making enough milk.  You have nipple pain, soreness, or redness.  You have other questions or concerns related to you or your infant. Document Released: 05/13/2010 Document Revised: 02/15/2012 Document Reviewed: 05/13/2010 [facility] Patient Information 2014 [place], [place].    Do not drink alcohol, especially if you are  breastfeeding or taking medicine to relieve pain.  Do not chew or smoke tobacco.  Do not use illegal drugs. Continue to use good perineal care. Good perineal care includes:Breastfeeding Deciding to breastfeed is one of the best choices you can make for you and your baby. A change in hormones during pregnancy causes your breast tissue to grow and increases the number and size of your milk ducts. These hormones also allow proteins, sugars, and fats from your blood supply to make breast milk in your milk-producing glands. Hormones prevent breast milk from being released before your baby is born as well as prompt milk flow after birth. Once breastfeeding has begun, thoughts of your baby, as well as his or her sucking or crying, can stimulate the release of milk from your milk-producing glands.  BENEFITS OF BREASTFEEDING For Your Baby Your first milk (colostrum) helps your baby's digestive system function better.  There are antibodies in your milk that help your baby fight off infections.  Your baby has a lower incidence of asthma, allergies, and sudden infant death syndrome.  The nutrients in breast milk are better for your baby than infant formulas and are designed uniquely for your baby's needs.  Breast milk improves your baby's brain development.  Your baby is less likely to develop other conditions, such as childhood obesity, asthma, or type 2 diabetes mellitus.  For You  Breastfeeding helps to create a very special bond between you and your baby.  Breastfeeding is convenient. Breast milk is always available at the correct temperature and costs nothing.  Breastfeeding helps to burn calories and helps you lose the weight gained during pregnancy.  Breastfeeding makes your uterus contract to its prepregnancy size faster and slows bleeding (lochia) after you give birth.  Breastfeeding helps to lower your risk of developing type 2 diabetes mellitus, osteoporosis, and breast or ovarian cancer  later in life. SIGNS THAT YOUR BABY IS HUNGRY Early Signs of Hunger Increased alertness or activity. Stretching. Movement of the head from side to side. Movement of the head and opening of the mouth when the corner of the mouth or cheek is stroked (rooting). Increased sucking sounds, smacking lips, cooing, sighing, or squeaking. Hand-to-mouth movements. Increased sucking of fingers or hands. Late Signs of Hunger Fussing. Intermittent crying. Extreme Signs of Hunger Signs of extreme hunger will require calming and consoling before your baby will be able to breastfeed successfully. Do not wait for the following signs of extreme hunger to occur before you initiate breastfeeding:  Restlessness. A loud, strong cry.  Screaming. BREASTFEEDING BASICS Breastfeeding Initiation Find a comfortable place to sit or lie down, with your neck and back well supported. Place a pillow or rolled up blanket under your baby to bring him or her to the level of your breast (if you are seated). Nursing pillows are specially designed to help support your arms and your baby while you breastfeed. Make sure that your baby's abdomen is facing your abdomen.  Gently massage your breast. With your fingertips, massage from your chest wall toward your nipple in a circular motion. This encourages milk flow. You may need to continue this action during the feeding if your milk flows slowly. Support your breast with 4 fingers underneath and your thumb above your nipple. Make sure your fingers are well away from your nipple and your baby's mouth.  Stroke your baby's lips gently with your finger or nipple.  When your baby's mouth is open wide enough, quickly bring your baby to your breast, placing your entire nipple and as much of the colored area around your nipple (areola) as possible into your baby's mouth.  More areola should be visible above your baby's upper lip than below the lower lip.  Your baby's tongue should be  between his or her lower gum and your breast.  Ensure that your baby's mouth is correctly positioned around your nipple (latched). Your baby's lips should create a seal on your breast and be turned out (everted). It is common for your baby to suck about 2 3 minutes in order to start the flow of breast milk. Latching Teaching your baby how to latch on to your breast properly is very important. An improper latch can cause nipple pain and decreased milk supply for you and poor weight gain in your baby. Also, if your baby is not latched onto your nipple properly, he or she may swallow some air  during feeding. This can make your baby fussy. Burping your baby when you switch breasts during the feeding can help to get rid of the air. However, teaching your baby to latch on properly is still the best way to prevent fussiness from swallowing air while breastfeeding. Signs that your baby has successfully latched on to your nipple:   Silent tugging or silent sucking, without causing you pain.  Swallowing heard between every 3 4 sucks.   Muscle movement above and in front of his or her ears while sucking.  Signs that your baby has not successfully latched on to nipple:  Sucking sounds or smacking sounds from your baby while breastfeeding. Nipple pain. If you think your baby has not latched on correctly, slip your finger into the corner of your baby's mouth to break the suction and place it between your baby's gums. Attempt breastfeeding initiation again. Signs of Successful Breastfeeding Signs from your baby:  A gradual decrease in the number of sucks or complete cessation of sucking.  Falling asleep.  Relaxation of his or her body.  Retention of a small amount of milk in his or her mouth.  Letting go of your breast by himself or herself. Signs from you: Breasts that have increased in firmness, weight, and size 1 3 hours after feeding.  Breasts that are softer immediately after  breastfeeding. Increased milk volume, as well as a change in milk consistency and color by the 5th day of breastfeeding.  Nipples that are not sore, cracked, or bleeding. Signs That Your Pecola Leisure is Getting Enough Milk Wetting at least 3 diapers in a 24-hour period. The urine should be clear and pale yellow by age 23 days. At least 3 stools in a 24-hour period by age 23 days. The stool should be soft and yellow. At least 3 stools in a 24-hour period by age 715 days. The stool should be seedy and yellow. No loss of weight greater than 10% of birth weight during the first 54 days of age. Average weight gain of 4 7 ounces (120 210 mL) per week after age 71 days. Consistent daily weight gain by age 23 days, without weight loss after the age of 2 weeks. After a feeding, your baby may spit up a small amount. This is common. BREASTFEEDING FREQUENCY AND DURATION Frequent feeding will help you make more milk and can prevent sore nipples and breast engorgement. Breastfeed when you feel the need to reduce the fullness of your breasts or when your baby shows signs of hunger. This is called "breastfeeding on demand." Avoid introducing a pacifier to your baby while you are working to establish breastfeeding (the first 4 6 weeks after your baby is born). After this time you may choose to use a pacifier. Research has shown that pacifier use during the first year of a baby's life decreases the risk of sudden infant death syndrome (SIDS). Allow your baby to feed on each breast as long as he or she wants. Breastfeed until your baby is finished feeding. When your baby unlatches or falls asleep while feeding from the first breast, offer the second breast. Because newborns are often sleepy in the first few weeks of life, you may need to awaken your baby to get him or her to feed. Breastfeeding times will vary from baby to baby. However, the following rules can serve as a guide to help you ensure that your baby is properly  fed: Newborns (babies 29 weeks of age or younger) may breastfeed every  1 3 hours. Newborns should not go longer than 3 hours during the day or 5 hours during the night without breastfeeding. You should breastfeed your baby a minimum of 8 times in a 24-hour period until you begin to introduce solid foods to your baby at around 58 months of age. BREAST MILK PUMPING Pumping and storing breast milk allows you to ensure that your baby is exclusively fed your breast milk, even at times when you are unable to breastfeed. This is especially important if you are going back to work while you are still breastfeeding or when you are not able to be present during feedings. Your lactation consultant can give you guidelines on how long it is safe to store breast milk.  A breast pump is a machine that allows you to pump milk from your breast into a sterile bottle. The pumped breast milk can then be stored in a refrigerator or freezer. Some breast pumps are operated by hand, while others use electricity. Ask your lactation consultant which type will work best for you. Breast pumps can be purchased, but some hospitals and breastfeeding support groups lease breast pumps on a monthly basis. A lactation consultant can teach you how to hand express breast milk, if you prefer not to use a pump.  CARING FOR YOUR BREASTS WHILE YOU BREASTFEED Nipples can become dry, cracked, and sore while breastfeeding. The following recommendations can help keep your breasts moisturized and healthy: Avoid using soap on your nipples.  Wear a supportive bra. Although not required, special nursing bras and tank tops are designed to allow access to your breasts for breastfeeding without taking off your entire bra or top. Avoid wearing underwire style bras or extremely tight bras. Air dry your nipples for 3 after each feeding.  Use only cotton bra pads to absorb leaked breast milk. Leaking of breast milk between feedings is normal.  Use  lanolin on your nipples after breastfeeding. Lanolin helps to maintain your skin's normal moisture barrier. If you use pure lanolin you do not need to wash it off before feeding your baby again. Pure lanolin is not toxic to your baby. You may also hand express a few drops of breast milk and gently massage that milk into your nipples and allow the milk to air dry. In the first few weeks after giving birth, some women experience extremely full breasts (engorgement). Engorgement can make your breasts feel heavy, warm, and tender to the touch. Engorgement peaks within 3 5 days after you give birth. The following recommendations can help ease engorgement: Completely empty your breasts while breastfeeding or pumping. You may want to start by applying warm, moist heat (in the shower or with warm water-soaked hand towels) just before feeding or pumping. This increases circulation and helps the milk flow. If your baby does not completely empty your breasts while breastfeeding, pump any extra milk after he or she is finished. Wear a snug bra (nursing or regular) or tank top for 1 2 days to signal your body to slightly decrease milk production. Apply ice packs to your breasts, unless this is too uncomfortable for you. Make sure that your baby is latched on and positioned properly while breastfeeding. If engorgement persists after 48 hours of following these recommendations, contact your health care provider or a Advertising copywriter. OVERALL HEALTH CARE RECOMMENDATIONS WHILE BREASTFEEDING Eat healthy foods. Alternate between meals and snacks, eating 3 of each per day. Because what you eat affects your breast milk, some of the foods  may make your baby more irritable than usual. Avoid eating these foods if you are sure that they are negatively affecting your baby. Drink milk, fruit juice, and water to satisfy your thirst (about 10 glasses a day).  Rest often, relax, and continue to take your prenatal vitamins to  prevent fatigue, stress, and anemia. Continue breast self-awareness checks. Avoid chewing and smoking tobacco. Avoid alcohol and drug use. Some medicines that may be harmful to your baby can pass through breast milk. It is important to ask your health care provider before taking any medicine, including all over-the-counter and prescription medicine as well as vitamin and herbal supplements. It is possible to become pregnant while breastfeeding. If birth control is desired, ask your health care provider about options that will be safe for your baby. SEEK MEDICAL CARE IF:  You feel like you want to stop breastfeeding or have become frustrated with breastfeeding. You have painful breasts or nipples. Your nipples are cracked or bleeding. Your breasts are red, tender, or warm. You have a swollen area on either breast. You have a fever or chills. You have nausea or vomiting. You have drainage other than breast milk from your nipples. Your breasts do not become full before feedings by the 5th day after you give birth. You feel sad and depressed. Your baby is too sleepy to eat well. Your baby is having trouble sleeping.  Your baby is wetting less than 3 diapers in a 24-hour period. Your baby has less than 3 stools in a 24-hour period. Your baby's skin or the white part of his or her eyes becomes yellow.  Your baby is not gaining weight by 71 days of age. SEEK IMMEDIATE MEDICAL CARE IF:  Your baby is overly tired (lethargic) and does not want to wake up and feed. Your baby develops an unexplained fever. Document Released: 11/23/2005 Document Revised: 07/26/2013 Document Reviewed: 05/17/2013 [facility] Patient Information 2014 [place], [place].    Wiping your perineum from front to back.  Keeping your perineum clean.  Do not use tampons or douche until your caregiver says it is okay.  Shower, wash your hair, and take tub baths as directed by your caregiver.  Wear a well-fitting bra that  provides breast support.  Eat healthy foods.  Drink enough fluids to keep your urine clear or pale yellow.  Eat high-fiber foods such as whole grain cereals and breads, brown rice, beans, and fresh fruits and vegetables every day. These foods may help prevent or relieve constipation.  Follow your cargiver's recommendations regarding resumption of activities such as climbing stairs, driving, lifting, exercising, or traveling.  Talk to your caregiver about resuming sexual activities. Resumption of sexual activities is dependent upon your risk of infection, your rate of healing, and your comfort and desire to resume sexual activity.  Try to have someone help you with your household activities and your newborn for at least a few days after you leave the hospital.  Rest as much as possible. Try to rest or take a nap when your newborn is sleeping.  Increase your activities gradually.  Keep all of your scheduled postpartum appointments. It is very important to keep your scheduled follow-up appointments. At these appointments, your caregiver will be checking to make sure that you are healing physically and emotionally. SEEK MEDICAL CARE IF:   You are passing large clots from your vagina. Save any clots to show your caregiver.  You have a foul smelling discharge from your vagina.  You have trouble urinating.  You are urinating frequently.  You have pain when you urinate.  You have a change in your bowel movements.  You have increasing redness, pain, or swelling near your vaginal incision (episiotomy) or vaginal tear.  You have pus draining from your episiotomy or vaginal tear.  Your episiotomy or vaginal tear is separating.  You have painful, hard, or reddened breasts.  You have a severe headache.  You have blurred vision or see spots.  You feel sad or depressed.  You have thoughts of hurting yourself or your newborn.  You have questions about your care, the care of your  newborn, or medicines.  You are dizzy or lightheaded.  You have a rash.  You have nausea or vomiting.  You were breastfeeding and have not had a menstrual period within 12 weeks after you stopped breastfeeding.  You are not breastfeeding and have not had a menstrual period by the 12th week after delivery.  You have a fever. SEEK IMMEDIATE MEDICAL CARE IF:   You have persistent pain.  You have chest pain.  You have shortness of breath.  You faint.  You have leg pain.  You have stomach pain.  Your vaginal bleeding saturates two or more sanitary pads in 1 hour. MAKE SURE YOU:   Understand these instructions.  Will watch your condition.  Will get help right away if you are not doing well or get worse. Document Released: 11/20/2000 Document Revised: 08/17/2012 Document Reviewed: 07/20/2012 [facility] Patient Information 2014 [employer], [place].

## 2014-02-27 NOTE — H&P (Signed)
`````  Attestation of Attending Supervision of Advanced Practitioner: Evaluation and management procedures were performed by the PA/NP/CNM/OB Fellow under my supervision/collaboration. Chart reviewed and agree with management and plan.  Deyana Wnuk V 02/27/2014 12:08 AM

## 2014-02-28 ENCOUNTER — Ambulatory Visit: Payer: Self-pay

## 2014-02-28 NOTE — Lactation Note (Signed)
This note was copied from the chart of Boy Tonja Mangieri. Lactation Consultation Note  Patient Name: Boy Katheran JamesZenda Stanbrough AVWUJ'WToday's Date: 02/28/2014 Reason for consult: Follow-up assessment Mom reports baby will not latch so she has been formula and bottle feeding. She pumped 1 time last night. Mom is not registered with WIC. Offered LC assistance to latch baby if Mom desires to put baby to breast. Left LC phone number to call. Advised Mom if she plans to pump and bottle feed she needs to pump consistently every 3 hours for 15 minutes to encourage milk production and protect milk supply. Demonstrated hand pump for Mom to use at home, declines pump rental. Engorgement care reviewed if needed. Pump and storage guidelines reviewed. Advised of OP services and support group.   Maternal Data    Feeding    LATCH Score/Interventions                      Lactation Tools Discussed/Used Tools: Pump Breast pump type: Manual WIC Program: No   Consult Status Consult Status: Complete Date: 02/28/14 Follow-up type: In-patient    Alfred LevinsGranger, Sheanna Dail Ann 02/28/2014, 9:14 AM

## 2014-03-01 NOTE — Discharge Summary (Signed)
Attestation of Attending Supervision of Obstetric Fellow: Evaluation and management procedures were performed by the Obstetric Fellow under my supervision and collaboration.  I have reviewed the Obstetric Fellow's note and chart, and I agree with the management and plan.  Raymel Cull, MD, FACOG Attending Obstetrician & Gynecologist Faculty Practice, [facility]   

## 2014-03-02 ENCOUNTER — Inpatient Hospital Stay (HOSPITAL_COMMUNITY): Admission: RE | Admit: 2014-03-02 | Payer: Medicaid Other | Source: Ambulatory Visit

## 2014-03-05 ENCOUNTER — Encounter: Payer: Self-pay | Admitting: *Deleted

## 2014-04-04 ENCOUNTER — Encounter (HOSPITAL_COMMUNITY): Payer: Self-pay | Admitting: Advanced Practice Midwife

## 2014-04-04 ENCOUNTER — Ambulatory Visit (INDEPENDENT_AMBULATORY_CARE_PROVIDER_SITE_OTHER): Payer: Medicaid Other | Admitting: Advanced Practice Midwife

## 2014-04-04 ENCOUNTER — Encounter: Payer: Self-pay | Admitting: Advanced Practice Midwife

## 2014-04-04 ENCOUNTER — Encounter (HOSPITAL_COMMUNITY): Payer: Self-pay | Admitting: *Deleted

## 2014-04-04 VITALS — BP 124/69 | HR 106 | Resp 20 | Ht 61.0 in | Wt 167.7 lb

## 2014-04-04 DIAGNOSIS — IMO0001 Reserved for inherently not codable concepts without codable children: Secondary | ICD-10-CM

## 2014-04-04 DIAGNOSIS — O99815 Abnormal glucose complicating the puerperium: Secondary | ICD-10-CM

## 2014-04-04 DIAGNOSIS — Z3049 Encounter for surveillance of other contraceptives: Secondary | ICD-10-CM

## 2014-04-04 DIAGNOSIS — O24429 Gestational diabetes mellitus in childbirth, unspecified control: Secondary | ICD-10-CM

## 2014-04-04 MED ORDER — MEDROXYPROGESTERONE ACETATE 150 MG/ML IM SUSP: 150.0000 mg | Freq: Once | INTRAMUSCULAR | Status: DC

## 2014-04-04 MED ORDER — MEDROXYPROGESTERONE ACETATE 104 MG/0.65ML ~~LOC~~ SUSP
104.0000 mg | Freq: Once | SUBCUTANEOUS | Status: AC
Start: 2014-04-04 — End: 2014-04-04
  Administered 2014-04-04: 104 mg via SUBCUTANEOUS

## 2014-04-04 NOTE — Progress Notes (Signed)
Patient ID: Cindy NewportZenda T Corum, female   DOB: [birth year], 23 y.o.   MRN: [id number] Pt states she is checking her CBG twice daily- fasting and 2 hrs after dinner.  She is not taking any diabetes meds.  BTS papers previously signed- can have surgery after 03/31/14.  Not using any form of birth control currently

## 2014-04-04 NOTE — Patient Instructions (Signed)
Laparoscopic Tubal Ligation  Laparoscopic tubal ligation is a procedure that closes the fallopian tubes at a time other than right after childbirth. By closing the fallopian tubes, the eggs that are released from the ovaries cannot enter the uterus and sperm cannot reach the egg. Tubal ligation is also known as getting your "tubes tied." Tubal ligation is done so you will not be able to get pregnant or have a baby.   Although this procedure may be reversed, it should be considered permanent and irreversible. If you want to have future pregnancies, you should not have this procedure.   LET YOUR CAREGIVER KNOW ABOUT:  · Allergies to food or medicine.  · Medicines taken, including vitamins, herbs, eyedrops, over-the-counter medicines, and creams.  · Use of steroids (by mouth or creams).  · Previous problems with numbing medicines.  · History of bleeding problems or blood clots.  · Any recent colds or infections.  · Previous surgery.  · Other health problems, including diabetes and kidney problems.  · Possibility of pregnancy, if this applies.  · Any past pregnancies.  RISKS AND COMPLICATIONS   · Infection.  · Bleeding.  · Injury to surrounding organs.  · Anesthetic side effects.  · Failure of the procedure.  · Ectopic pregnancy.  · Future regret about having the procedure done.  BEFORE THE PROCEDURE  · Do not take aspirin or blood thinners a week before the procedure or as directed. This can cause bleeding.  · Do not eat or drink anything 6 to 8 hours before the procedure.  PROCEDURE   · You may be given a medicine to help you relax (sedative) before the procedure. You will be given a medicine to make you sleep (general anesthetic) during the procedure.  · A tube will be put down your throat to help your breath while under general anesthesia.  · Two small cuts (incisions) are made in the lower abdominal area and near the belly button.  · Your abdominal area will be inflated with a safe gas (carbon dioxide). This helps  give the surgeon room to operate, visualize, and helps the surgeon avoid other organs.  · A thin, lighted tube (laparoscope) with a camera attached is inserted into your abdomen through one of the incisions near the belly button. Other small instruments are also inserted through the other abdominal incision.  · The fallopian tubes are located and are either blocked with a ring, clip, or are burned (cauterized).  · After the fallopian tubes are blocked, the gas is released from the abdomen.  · The incisions will be closed with stitches (sutures), and a bandage may be placed over the incisions.  AFTER THE PROCEDURE   · You will rest in a recovery room for 1 4 hours until you are stable and doing well.  · You will also have some mild abdominal discomfort for 3 7 days. You will be given pain medicine to ease any discomfort.  · As long as there are no problems, you may be allowed to go home. Someone will need to drive you home and be with you for at least 24 hours once home.  · You may have some mild discomfort in the throat. This is from the tube placed in your throat while you were sleeping.  · You may experience discomfort in the shoulder area from some trapped air between the liver and diaphragm. This sensation is normal and will slowly go away on its own.  Document Released: 03/01/2001 Document   Revised: 05/24/2012 Document Reviewed: 03/05/2012  ExitCare® Patient Information ©2014 [employer].

## 2014-04-04 NOTE — Progress Notes (Signed)
  Subjective:     Cindy Bruce is a 23 y.o. female who presents for a postpartum visit. She is 4 weeks postpartum following a spontaneous vaginal delivery. I have fully reviewed the prenatal and intrapartum course. The delivery was at term. Outcome: spontaneous vaginal delivery. Anesthesia: epidural. Postpartum course has been uneventful. Baby's course has been uneventful. Baby is feeding by bottle. Bleeding no bleeding. Bowel function is normal. Bladder function is normal. Patient is not sexually active. Contraception method is Depo-Provera injections and then BTL (papers signed a month ago). Postpartum depression screening: negative.  The following portions of the patient's history were reviewed and updated as appropriate: allergies, current medications, past family history, past medical history, past social history, past surgical history and problem list.  Review of Systems Pertinent items are noted in HPI.   Objective:    BP 124/69  Pulse 106  Resp 20  Ht 5\' 1"  (1.549 m)  Wt 167 lb 11.2 oz (76.068 kg)  BMI 31.70 kg/m2  Breastfeeding? No  General:  alert, cooperative and no distress   Breasts:  inspection negative, no nipple discharge or bleeding, no masses or nodularity palpable  Lungs: respirations unlabored  Heart:  rate normal  Abdomen: soft, non-tender; bowel sounds normal; no masses,  no organomegaly   Vulva:  not evaluated  Vagina: not evaluated  Cervix:  n/a  Corpus: not examined  Adnexa:  not evaluated  Rectal Exam: Not performed.        Assessment:     normal postpartum exam. Pap smear not done at today's visit.   Plan:    1. Contraception: Depo-Provera injections and tubal ligation 2. Will schedule 2 hr GTT.  Has been checking sugars at home, mostly normal. Will schedule outpatient BTL. Procedure and permanency reviewed 3. Follow up in: 6 weeks or as needed.

## 2014-04-06 ENCOUNTER — Other Ambulatory Visit: Payer: Medicaid Other

## 2014-04-06 ENCOUNTER — Encounter (HOSPITAL_COMMUNITY): Admission: RE | Payer: Self-pay | Source: Ambulatory Visit

## 2014-04-06 ENCOUNTER — Ambulatory Visit (HOSPITAL_COMMUNITY)
Admission: RE | Admit: 2014-04-06 | Payer: Medicaid Other | Source: Ambulatory Visit | Admitting: Obstetrics & Gynecology

## 2014-04-06 SURGERY — LIGATION, FALLOPIAN TUBE, LAPAROSCOPIC
Anesthesia: Choice | Laterality: Bilateral

## 2014-04-24 ENCOUNTER — Encounter (HOSPITAL_COMMUNITY): Payer: Self-pay | Admitting: Pharmacist

## 2014-05-01 ENCOUNTER — Inpatient Hospital Stay (HOSPITAL_COMMUNITY)
Admission: RE | Admit: 2014-05-01 | Discharge: 2014-05-01 | Disposition: A | Discharge status: Home / Self Care | Payer: Medicaid Other | Source: Ambulatory Visit

## 2014-05-01 ENCOUNTER — Encounter (HOSPITAL_COMMUNITY): Payer: Self-pay

## 2014-05-01 NOTE — Patient Instructions (Signed)
Your procedure is scheduled on: Wednesday, May 02, 2014  Enter through the [employer] of [facility] at: 8:00am  Pick up the phone at the desk and dial [phone].  Call this number if you have problems the morning of surgery: [phone].  Remember: Do NOT eat food:  After midnight tonight Do NOT drink clear liquids after:  After midnight tonight Take these medicines the morning of surgery with a SIP OF WATER:  Do NOT wear jewelry (body piercing), metal hair clips/bobby pins, make-up, or nail polish. Do NOT wear lotions, powders, or perfumes.  You may wear deoderant. Do NOT shave for 48 hours prior to surgery. Do NOT bring valuables to the hospital. Contacts, dentures, or bridgework may not be worn into surgery.  Have a responsible adult drive you home and stay with you for 24 hours after your procedure

## 2014-05-02 ENCOUNTER — Encounter (HOSPITAL_COMMUNITY): Admission: RE | Payer: Self-pay | Source: Ambulatory Visit

## 2014-05-02 ENCOUNTER — Ambulatory Visit (HOSPITAL_COMMUNITY)
Admission: RE | Admit: 2014-05-02 | Payer: Medicaid Other | Source: Ambulatory Visit | Admitting: Obstetrics & Gynecology

## 2014-05-02 ENCOUNTER — Encounter (HOSPITAL_COMMUNITY): Payer: Self-pay | Admitting: Anesthesiology

## 2014-05-02 SURGERY — LIGATION, FALLOPIAN TUBE, LAPAROSCOPIC
Anesthesia: Choice | Laterality: Bilateral

## 2014-06-26 ENCOUNTER — Ambulatory Visit: Payer: Medicaid Other

## 2014-07-05 ENCOUNTER — Ambulatory Visit (INDEPENDENT_AMBULATORY_CARE_PROVIDER_SITE_OTHER): Payer: Medicaid Other | Admitting: General Practice

## 2014-07-05 VITALS — BP 119/73 | HR 96 | Temp 98.7°F | Ht 61.0 in | Wt 168.3 lb

## 2014-07-05 DIAGNOSIS — Z3042 Encounter for surveillance of injectable contraceptive: Secondary | ICD-10-CM

## 2014-07-05 DIAGNOSIS — Z3049 Encounter for surveillance of other contraceptives: Secondary | ICD-10-CM

## 2014-07-05 MED ORDER — MEDROXYPROGESTERONE ACETATE 104 MG/0.65ML ~~LOC~~ SUSP
104.0000 mg | Freq: Once | SUBCUTANEOUS | Status: AC
  Administered 2014-07-05: 104 mg via SUBCUTANEOUS

## 2014-07-09 ENCOUNTER — Encounter (HOSPITAL_COMMUNITY): Payer: Self-pay | Admitting: Emergency Medicine

## 2014-07-09 ENCOUNTER — Emergency Department (HOSPITAL_COMMUNITY)
Admission: EM | Admit: 2014-07-09 | Discharge: 2014-07-09 | Disposition: A | Discharge status: Home / Self Care | Payer: Medicaid Other | Attending: Emergency Medicine | Admitting: Emergency Medicine

## 2014-07-09 DIAGNOSIS — J45909 Unspecified asthma, uncomplicated: Secondary | ICD-10-CM | POA: Diagnosis not present

## 2014-07-09 DIAGNOSIS — K1370 Unspecified lesions of oral mucosa: Secondary | ICD-10-CM | POA: Insufficient documentation

## 2014-07-09 DIAGNOSIS — F17200 Nicotine dependence, unspecified, uncomplicated: Secondary | ICD-10-CM | POA: Insufficient documentation

## 2014-07-09 DIAGNOSIS — K029 Dental caries, unspecified: Secondary | ICD-10-CM | POA: Diagnosis not present

## 2014-07-09 DIAGNOSIS — K047 Periapical abscess without sinus: Secondary | ICD-10-CM | POA: Diagnosis not present

## 2014-07-09 DIAGNOSIS — E119 Type 2 diabetes mellitus without complications: Secondary | ICD-10-CM | POA: Diagnosis not present

## 2014-07-09 MED ORDER — HYDROCODONE-ACETAMINOPHEN 5-325 MG PO TABS: 1.0000 | ORAL_TABLET | Freq: Four times a day (QID) | ORAL | Status: DC | PRN

## 2014-07-09 MED ORDER — PENICILLIN V POTASSIUM 500 MG PO TABS: 500.0000 mg | ORAL_TABLET | Freq: Four times a day (QID) | ORAL | Status: AC

## 2014-07-09 MED ORDER — PENICILLIN V POTASSIUM 500 MG PO TABS
500.0000 mg | ORAL_TABLET | Freq: Once | ORAL | Status: AC
  Administered 2014-07-09: 500 mg via ORAL
  Filled 2014-07-09: qty 1

## 2014-07-09 MED ORDER — HYDROCODONE-ACETAMINOPHEN 5-325 MG PO TABS
2.0000 | ORAL_TABLET | Freq: Once | ORAL | Status: AC
  Administered 2014-07-09: 2 via ORAL
  Filled 2014-07-09: qty 2

## 2014-07-09 NOTE — Discharge Instructions (Signed)
Dental Abscess °A dental abscess is a collection of infected fluid (pus) from a bacterial infection in the inner part of the tooth (pulp). It usually occurs at the end of the tooth's root.  °CAUSES  °· Severe tooth decay. °· Trauma to the tooth that allows bacteria to enter into the pulp, such as a broken or chipped tooth. °SYMPTOMS  °· Severe pain in and around the infected tooth. °· Swelling and redness around the abscessed tooth or in the mouth or face. °· Tenderness. °· Pus drainage. °· Bad breath. °· Bitter taste in the mouth. °· Difficulty swallowing. °· Difficulty opening the mouth. °· Nausea. °· Vomiting. °· Chills. °· Swollen neck glands. °DIAGNOSIS  °· A medical and dental history will be taken. °· An examination will be performed by tapping on the abscessed tooth. °· X-rays may be taken of the tooth to identify the abscess. °TREATMENT °The goal of treatment is to eliminate the infection. You may be prescribed antibiotic medicine to stop the infection from spreading. A root canal may be performed to save the tooth. If the tooth cannot be saved, it may be pulled (extracted) and the abscess may be drained.  °HOME CARE INSTRUCTIONS °· Only take over-the-counter or prescription medicines for pain, fever, or discomfort as directed by your caregiver. °· Rinse your mouth (gargle) often with salt water (¼ tsp salt in 8 oz [250 ml] of warm water) to relieve pain or swelling. °· Do not drive after taking pain medicine (narcotics). °· Do not apply heat to the outside of your face. °· Return to your dentist for further treatment as directed. °SEEK MEDICAL CARE IF: °· Your pain is not helped by medicine. °· Your pain is getting worse instead of better. °SEEK IMMEDIATE MEDICAL CARE IF: °· You have a fever or persistent symptoms for more than 2-3 days. °· You have a fever and your symptoms suddenly get worse. °· You have chills or a very bad headache. °· You have problems breathing or swallowing. °· You have trouble  opening your mouth. °· You have swelling in the neck or around the eye. °Document Released: 11/23/2005 Document Revised: 08/17/2012 Document Reviewed: 03/03/2011 °ExitCare® Patient Information ©2015 [employer]. This information is not intended to replace advice given to you by your health care provider. Make sure you discuss any questions you have with your health care provider. ° ° °Emergency Department Resource Guide °1) Find a Doctor and Pay Out of Pocket °Although you won't have to find out who is covered by your insurance plan, it is a good idea to ask around and get recommendations. You will then need to call the office and see if the doctor you have chosen will accept you as a new patient and what types of options they offer for patients who are self-pay. Some doctors offer discounts or will set up payment plans for their patients who do not have insurance, but you will need to ask so you aren't surprised when you get to your appointment. ° °2) Contact Your Local Health Department °Not all health departments have doctors that can see patients for sick visits, but many do, so it is worth a call to see if yours does. If you don't know where your local health department is, you can check in your phone book. The CDC also has a tool to help you locate your state's health department, and many state websites also have listings of all of their local health departments. ° °3) Find a Walk-in   Clinic °If your illness is not likely to be very severe or complicated, you may want to try a walk in clinic. These are popping up all over the country in pharmacies, drugstores, and shopping centers. They're usually staffed by nurse practitioners or physician assistants that have been trained to treat common illnesses and complaints. They're usually fairly quick and inexpensive. However, if you have serious medical issues or chronic medical problems, these are probably not your best option. ° °No Primary Care Doctor: °- Call  Health Connect at  832-8000 - they can help you locate a primary care doctor that  accepts your insurance, provides certain services, etc. °- Physician Referral Service- 1-800-533-3463 ° °Chronic Pain Problems: °Organization         Address  Phone   Notes  °[facility]  (336) 297-2271 Patients need to be referred by their primary care doctor.  ° °Medication Assistance: °Organization         Address  Phone   Notes  °[place] [facility] [address] °[place], [place] [address] (336) 641-8030 --Must be a resident of [place] °-- Must have NO insurance coverage whatsoever (no Medicaid/ Medicare, etc.) °-- The pt. MUST have a primary care doctor that directs their care regularly and follows them in the community °  °[facility]  (866) 331-1348   °[employer]  (888) 892-1162   ° °Agencies that provide inexpensive medical care: °Organization         Address  Phone   Notes  °[facility]  (336) 832-8035   °[facility]    (336) 832-7272   °[facility] [address] °[place], [place] [address] (336) 832-4777   °[facility] [address], °[place] (336) 271-4999   °[facility]    (336) 373-0678   °[facility]    (336) 272-1050   °[facility] ° [address], [place] Phone:  (336) 832-4444, Fax:  (336) 832-4440 Hours of Operation:  9 am - 6 pm, M-F.  Also accepts Medicaid/Medicare and self-pay.  °[facility] ° [address], [place] Phone: (336) 832-3150, Fax: (336) 832-3151. Hours of Operation:  8:30 am - 5:30 pm, M-F.  Also accepts Medicaid and self-pay.  °[facility] [place] [address], [place] Phone: (336) 878-6027   °[facility] [address], [place], [place] (336)723-1848, Ext. 123 Mondays & Thursdays: 7-9 AM.  First 15 patients are seen on a first come, first serve  basis. °  ° °Medicaid-accepting [place] Providers: ° °Organization         Address  Phone   Notes  °[facility] [address], [place] (336) 641-2100 Also accepts self-pay patients.  °[facility] [address], [place] ° (336) 856-9996   °[facility] [address], [place] (336) 288-8857   °[facility] [address] [place] [address], [place] (336) 299-7000   °Veita Bland [address], [place]  ° (336) 373-1557 Only accepts [place] Access Medicaid patients after they have their name applied to their card.  ° °Self-Pay (no insurance) in [place]: ° °Organization         Address  Phone   Notes  °Sickle Cell Patients, [facility] [address], [place] (  336) 832-1970   °[facility] [address], [place] (336) 832-4400   °[facility] ° [address], [place] (336) 992-4800   °[facility]/Dr. Osei-Bonsu ° [address] [place] [address], [place] or [address], [place] (336) 841-8500 Phone number for both [place] and [place] locations is the same.  °[facility] [address], [place] (336) 299-0000   °[facility] [address] [place] [address], [place] or [address] (336) 852-7530 °(336) 878-2260   °[facility] [address], [place] (336) 350-1642, phone; (336) 294-5005, fax Sees patients 1st and 3rd Saturday of every month.  Must not qualify for public or private insurance (i.e. Medicaid, Medicare, [place] Health Choice, Veterans' Benefits) • Household income should be no more than 200% of the poverty level •The clinic cannot treat you if you are pregnant or think you are pregnant • Sexually transmitted diseases are not treated at the clinic.  ° ° °Dental Care: °Organization         Address  Phone  Notes  °[place]  [facility] [address], [place] (336) 641-6152 Accepts children up to age 21 who are enrolled in Medicaid or [place] Health Choice; pregnant women with a Medicaid card; and children who have applied for Medicaid or [place]  Health Choice, but were declined, whose parents can pay a reduced fee at time of service.  °[facility] [place]  [address], [place] (336) 641-7733 Accepts children up to age 21 who are enrolled in Medicaid or [place] Health Choice; pregnant women with a Medicaid card; and children who have applied for Medicaid or [place] Health Choice, but were declined, whose parents can pay a reduced fee at time of service.  °[facility] ° [address],  (336) 641-4533 Patients are seen by appointment only. Walk-ins are not accepted. [facility] will see patients 18 years of age and older. °Monday - Tuesday (8am-5pm) °Most Wednesdays (8:30-5pm) °$30 per visit, cash only  °[facility] ° [address], [place] (336) 641-4533 Patients are seen by appointment only. Walk-ins are not accepted. [facility] will see patients 18 years of age and older. °One Wednesday Evening (Monthly: Volunteer Based).  $30 per visit, cash only  °[facility]  (919) 537-3737 for adults; Children under age 4, call Graduate Pediatric Dentistry at ([phone]) [phone]. Children aged 4-14, please call (919) [phone] to request a pediatric application. ° Dental services are provided in all areas of dental care including fillings, crowns and bridges, complete and partial dentures, implants, gum treatment, root canals, and extractions. Preventive care is also provided. Treatment is provided to both adults and children. °Patients are selected via a lottery and there is often a waiting list. °  °[facility] [address], ° ° (336) 763-8833  [email or web address].[email or web address] °  °[facility] [address], [place], [place] (336)723-1848, Ext. 123 Second and Fourth Thursday of each month, opens at 6:30 AM; Clinic ends at 9 AM.  Patients are seen on a first-come first-served basis, and a limited number are seen during each clinic.  ° °[facility] ° [address], [place], [place] (336) 723-7904   Eligibility Requirements °You must have lived in [place], or [place] counties   for at least the last three months. °  You cannot be eligible for state or federal sponsored healthcare insurance, including Veterans Administration, Medicaid, or Medicare. °  You generally cannot be eligible for healthcare insurance through your employer.  °  How to apply: °Eligibility screenings are held every Tuesday and Wednesday afternoon from 1:00 pm until 4:00 pm. You do not need an appointment for the interview!  °[facility] [address], [place], [place] 336-631-2330   °[facility] [facility]  336-342-8273   °[facility]  336-703-3100   °[facility]  336-570-6415   ° °Behavioral Health Resources in the Community: °Intensive Outpatient Programs °Organization         Address  Phone  Notes  °[facility] [address], [place], [place] 336-878-6098   °[facility] [address], [place], [place] 336-832-9800   °[facility] [address], [place], [place] ° 336-882-2125   °[facility] [address],  °[place], [place] 1-800-853-5163 or 336-641-4981   °Substance Abuse Resources °Organization         Address  Phone  Notes  °[facility]  336-882-2125   °[facility]  336-784-9470   °[facility]  336-285-9073   °[facility]  336-845-3988   °Residential & Outpatient Substance Abuse Program  1-800-659-3381   °Psychological Services °Organization          Address  Phone  Notes  °[facility]  336- 832-9600   °[facility]  336- 378-7881   °[facility] [address], [place] 1-800-853-5163 or 336-641-4981   ° °Mobile Crisis Teams °Organization         Address  Phone  Notes  °[facility], Mobile Crisis Care Unit  1-877-626-1772   °Assertive °[facility] ° [address] [place], [place] 336-834-9664   °Sharon DeEsch [address] °[place] 336-554-5454   ° °Self-Help/Support Groups °Organization         Address  Phone             Notes  °[facility] - variety of support groups  336- 373-1402 Call for more information  °Narcotics Anonymous (NA), [facility] [address], °[place]  2 meetings at this location  ° °Residential Treatment Programs °Organization         Address  Phone  Notes  °[facility] [address],    °[place]  1-866-801-8205   °[facility] ° [address], [place], [place] 704-293-8524   °[facility] [address], [place] 336-845-3988 Admissions: 8am-3pm M-F  °[facility] [address]    °[place], [place] 336-841-1104   °[facility] [address], [place], [place] 336-379-7146   °[facility] [address]  °[place], [place] 336-285-9073   °[facility] - Intensive Outpatient [address], [place], [place] 336-852-3033   °[facility] [address]  °[place], [place] 1-877-615-2722 or 336-784-9470   °[facility] ([facility] [address] [place], [place] 336-227-7417 Accepts Medicaid  °[facility] [address]  °[place] 1-800-659-3381 Substance Abuse/Addiction Treatment  ° °[place] Behavioral Health Resources °Organization         Address  Phone  Notes  °[facility]  (888)   581-9988   °Julie Brannon, PhD [address] [place], [place]   (336) 349-5553 or (336) 951-0000   °[facility]   [address] °[place], [place] (336) 349-4454   °[facility] [address], [place], [place] (336) 342-8316 Insurance/Medicaid/sponsorship through [facility]  °[facility] [address]                                    [place], [place] (336) 342-8316 Therapy/tele-psych/case  °[facility] [address]  ° [place], [place] (336) 349-2233    °Dr. Arfeen  (336) 349-4544   °[facility] [place]  [employer] [place] [facility] 1) [address],  °2) [address], [place] °3)  [address], [place] (336) 349-3220 °(336) 342-7768 ° °(336) 342-8140   °[place] [facility] (336) 342-1394 or (336) 342-3537 (After Hours)    ° ° ° °

## 2014-07-09 NOTE — ED Notes (Signed)
Initial Contact - pt A+Ox4, c/o 7/10 R lower teeth pain x3 days, denies fevers/chills.  +swelling noted, tender.  Skin PWD.  Speaking full/clear sentences, rr even/un-lab.  Pt with no difficulty clearing secretions or maintaining airway.  NAD.

## 2014-07-09 NOTE — ED Provider Notes (Signed)
Medical screening examination/treatment/procedure(s) were performed by non-physician practitioner and as supervising physician I was immediately available for consultation/collaboration.    Vida RollerBrian D Karin Griffith, MD 07/09/14 2322

## 2014-07-09 NOTE — ED Notes (Signed)
Pt c/o pain and swelling to R side of mouth. Pt thinks she has an infection to the lower R side of her mouth. Pt does not have a dentist and needs a referral. Pt has no acute distress.

## 2014-07-09 NOTE — ED Provider Notes (Signed)
CSN: [id number]     Arrival date & time 07/09/14  2032 History  This chart was scribed for non-physician practitioner working with No att. providers found by Elveria Rising, ED Scribe. This patient was seen in room WTR9/WTR9 and the patient's care was started at 11:01 PM.   No chief complaint on file.  The history is provided by the patient. No language interpreter was used.   HPI Comments: Cindy Bruce is a 23 y.o. female who presents to the Emergency Department complaining of constant throbbing right oral pain and swelling, ongoing for three days. Patient suspects she may have an infection. Patient reports treatment with ibuprofen, without relief. Last taken yesterday. Patient denies oral drainage or blood. No fever, direct trauma or injury to the mouth, inability to swallow, drooling, or voice changes. Patient states that she has had a dental abscess previously.  Patient does not have a dentist and desires referral.   Past Medical History  Diagnosis Date  . Asthma   . Medical history non-contributory   . 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 Tooth extraction     Family History  Problem Relation Age of Onset  . Diabetes Mother   . Diabetes Maternal Grandmother    History  Substance Use Topics  . Smoking status: Current Every Day Smoker -- 0.25 packs/day    Types: Cigarettes  . Smokeless tobacco: Former Neurosurgeon  . Alcohol Use: No   OB History   Grav Para Term Preterm Abortions TAB SAB Ect Mult Living   3 3 3  0 0 0 0 0 0 3      Review of Systems  Constitutional: Positive for chills. Negative for fever.  HENT: Positive for dental problem and facial swelling. Negative for drooling, trouble swallowing and voice change.      Allergies  Food and Watermelon  Home Medications   Prior to Admission medications   Medication Sig Start Date End Date Taking? Authorizing Provider   HYDROcodone-acetaminophen (NORCO/VICODIN) 5-325 MG per tablet Take 1-2 tablets by mouth every 6 (six) hours as needed for moderate pain or severe pain. 07/09/14   Antony Madura, PA-C  penicillin v potassium (VEETID) 500 MG tablet Take 1 tablet (500 mg total) by mouth 4 (four) times daily. 07/09/14 07/16/14  Antony Madura, PA-C   Triage Vitals: BP 133/86  Pulse 105  Temp(Src) 98.4 F (36.9 C) (Oral)  Resp 16  Ht 5\' 1"  (1.549 m)  Wt 169 lb (76.658 kg)  BMI 31.95 kg/m2  SpO2 100%  Physical Exam  Nursing note and vitals reviewed. Constitutional: She is oriented to person, place, and time. She appears well-developed and well-nourished. No distress.  Nontoxic/nonseptic appearing  HENT:  Head: Normocephalic and atraumatic.  Right Ear: No mastoid tenderness.  Left Ear: No mastoid tenderness.  Mouth/Throat: Uvula is midline, oropharynx is clear and moist and mucous membranes are normal. No oral lesions. No trismus in the jaw. Abnormal dentition. Dental abscesses and dental caries present. No uvula swelling. No oropharyngeal exudate or posterior oropharyngeal edema.    Tenderness to palpation of right lower first and second molars. Mild gingival swelling without fluctuance. Very mild trismus appreciated. Patient tolerating secretions without difficulty or drooling. No voice changes. Oropharynx clear.  Eyes: Conjunctivae and EOM are normal. Pupils are equal, round, and reactive to light. No scleral icterus.  Neck: Normal range of motion. Neck supple.  Cardiovascular: Regular rhythm and intact distal pulses.   Mild tachycardia  Pulmonary/Chest: Effort normal. No respiratory distress. She has no wheezes.  Chest expansion symmetric.  Musculoskeletal: Normal range of motion.  Lymphadenopathy:    She has cervical adenopathy (b/l anterior cervical; R>L).  Neurological: She is alert and oriented to person, place, and time. She exhibits normal muscle tone. Coordination normal.  GCS 15. Patient moves  extremities without ataxia.  Skin: Skin is warm and dry. No rash noted. She is not diaphoretic. No erythema. No pallor.  Psychiatric: She has a normal mood and affect. Her behavior is normal.    ED Course  Procedures (including critical care time) COORDINATION OF CARE: 10:12 PM- Discussed treatment plan with patient at bedside and patient agreed to plan.   Labs Review Labs Reviewed - No data to display  Imaging Review No results found.   EKG Interpretation None      MDM   Final diagnoses:  Dental abscess    Patient with toothache and R lower facial swelling. Findings suggestive of early dental abscess. No marked trismus. No stridor. Exam not concerning for Ludwig's angina. Will treat with penicillin and pain medicine. Urged patient to follow-up with dentist. Referral and resource guide provided. Patient agreeable to plan with no unaddressed concerns.  I personally performed the services described in this documentation, which was scribed in my presence. The recorded information has been reviewed and is accurate.   Filed Vitals:   07/09/14 2047 07/09/14 2103 07/09/14 2232  BP: 139/85 133/86 134/79  Pulse: 108 105 98  Temp: 98.7 F (37.1 C) 98.4 F (36.9 C) 98.8 F (37.1 C)  TempSrc: Oral Oral Oral  Resp: 20 16 17   Height: 5\' 1"  (1.549 m)    Weight: 169 lb (76.658 kg)    SpO2: 100% 100% 100%     Antony MaduraKelly Pasqualino Witherspoon, PA-C 07/09/14 2305

## 2014-08-12 ENCOUNTER — Emergency Department (HOSPITAL_COMMUNITY)
Admission: EM | Admit: 2014-08-12 | Discharge: 2014-08-12 | Disposition: A | Discharge status: Home / Self Care | Payer: Medicaid Other | Attending: Emergency Medicine | Admitting: Emergency Medicine

## 2014-08-12 ENCOUNTER — Encounter (HOSPITAL_COMMUNITY): Payer: Self-pay | Admitting: Emergency Medicine

## 2014-08-12 DIAGNOSIS — F17200 Nicotine dependence, unspecified, uncomplicated: Secondary | ICD-10-CM | POA: Insufficient documentation

## 2014-08-12 DIAGNOSIS — K047 Periapical abscess without sinus: Secondary | ICD-10-CM | POA: Insufficient documentation

## 2014-08-12 DIAGNOSIS — J45909 Unspecified asthma, uncomplicated: Secondary | ICD-10-CM | POA: Diagnosis not present

## 2014-08-12 DIAGNOSIS — E119 Type 2 diabetes mellitus without complications: Secondary | ICD-10-CM | POA: Insufficient documentation

## 2014-08-12 DIAGNOSIS — K089 Disorder of teeth and supporting structures, unspecified: Secondary | ICD-10-CM | POA: Diagnosis present

## 2014-08-12 DIAGNOSIS — K029 Dental caries, unspecified: Secondary | ICD-10-CM | POA: Diagnosis not present

## 2014-08-12 MED ORDER — IBUPROFEN 800 MG PO TABS: 800.0000 mg | ORAL_TABLET | Freq: Three times a day (TID) | ORAL | Status: DC | PRN

## 2014-08-12 MED ORDER — HYDROCODONE-ACETAMINOPHEN 5-325 MG PO TABS: 1.0000 | ORAL_TABLET | Freq: Four times a day (QID) | ORAL | Status: DC | PRN

## 2014-08-12 MED ORDER — PENICILLIN V POTASSIUM 500 MG PO TABS: 500.0000 mg | ORAL_TABLET | Freq: Four times a day (QID) | ORAL | Status: DC

## 2014-08-12 NOTE — Discharge Instructions (Signed)
Return here as needed. Follow up with the dentist provided. °

## 2014-08-12 NOTE — ED Provider Notes (Signed)
Medical screening examination/treatment/procedure(s) were performed by non-physician practitioner and as supervising physician I was immediately available for consultation/collaboration.   EKG Interpretation None      Devoria Albe, MD, Armando Gang   Ward Givens, MD 08/12/14 2250

## 2014-08-12 NOTE — ED Provider Notes (Signed)
CSN: [id number]     Arrival date & time 08/12/14  1722 History  This chart was scribed for Cindy Night, PA-C working with Ward Givens, MD by Evon Slack, ED Scribe. This patient was seen in room WTR9/WTR9 and the patient's care was started at 6:34 PM.     Chief Complaint  Patient presents with  . Dental Pain   Patient is a 23 y.o. female presenting with tooth pain. The history is provided by the patient. No language interpreter was used.  Dental Pain Associated symptoms: facial swelling    HPI Comments: Cindy Bruce is a 23 y.o. female who presents to the Emergency Department complaining of left sided dental pain onset 3 days prior. She states she thinks has a dental abscess present. She states she has associated facial swelling. She states that she has been trying to get a dental appointment but was told that she was not covered due to her insurance.   Past Medical History  Diagnosis Date  . Asthma   . Medical history non-contributory   . 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 Tooth extraction     Family History  Problem Relation Age of Onset  . Diabetes Mother   . Diabetes Maternal Grandmother    History  Substance Use Topics  . Smoking status: Current Every Day Smoker -- 0.25 packs/day    Types: Cigarettes  . Smokeless tobacco: Former Neurosurgeon  . Alcohol Use: No   OB History   Grav Para Term Preterm Abortions TAB SAB Ect Mult Living   0 0 0 0 0 0 3     Review of Systems  HENT: Positive for dental problem and facial swelling.   All other systems reviewed and are negative.  Allergies  Food and Watermelon  Home Medications   Prior to Admission medications   Medication Sig Start Date End Date Taking? Authorizing Provider  HYDROcodone-acetaminophen (NORCO/VICODIN) 5-325 MG per tablet Take 1-2 tablets by mouth every 6 (six) hours as needed for moderate pain or severe pain.  07/09/14  Yes Antony Madura, PA-C  ibuprofen (ADVIL,MOTRIN) 200 MG tablet Take 400 mg by mouth every 6 (six) hours as needed for moderate pain.   Yes Historical Provider, MD   Triage Vitals: BP 127/79  Pulse 103  Temp(Src) 98.7 F (37.1 C) (Oral)  Resp 18  SpO2 100%  Physical Exam  Nursing note and vitals reviewed. Constitutional: She is oriented to person, place, and time. She appears well-developed and well-nourished. No distress.  HENT:  Head: Normocephalic and atraumatic.  Mouth/Throat: Uvula is midline. No trismus in the jaw. Abnormal dentition. Dental abscesses and dental caries present. No uvula swelling.    Neck: Normal range of motion. Neck supple. No tracheal deviation present.  Cardiovascular: Normal rate.   Pulmonary/Chest: Effort normal. No respiratory distress.  Musculoskeletal: Normal range of motion.  Neurological: She is alert and oriented to person, place, and time.  Skin: Skin is warm and dry.  Psychiatric: She has a normal mood and affect. Her behavior is normal.    ED Course  Procedures (including critical care time) DIAGNOSTIC STUDIES: Oxygen Saturation is 100% on RA, normal by my interpretation.    COORDINATION OF CARE: 6:43 PM-Discussed treatment plan which includes antibiotics with pt at bedside and pt agreed to plan.    Return here as needed. Follow up with  the dentist provided.    I personally performed the services described in this documentation, which was scribed in my presence. The recorded information has been reviewed and is accurate.     Carlyle Dolly, PA-C 08/12/14 1900

## 2014-08-12 NOTE — ED Notes (Signed)
PT c/o dental pain x 3 days.

## 2014-09-26 ENCOUNTER — Ambulatory Visit: Payer: Medicaid Other

## 2014-09-28 ENCOUNTER — Ambulatory Visit (INDEPENDENT_AMBULATORY_CARE_PROVIDER_SITE_OTHER): Payer: Medicaid Other

## 2014-09-28 VITALS — BP 125/68 | HR 102 | Temp 98.5°F

## 2014-09-28 DIAGNOSIS — Z3042 Encounter for surveillance of injectable contraceptive: Secondary | ICD-10-CM

## 2014-09-28 MED ORDER — MEDROXYPROGESTERONE ACETATE 104 MG/0.65ML ~~LOC~~ SUSP
104.0000 mg | Freq: Once | SUBCUTANEOUS | Status: AC
  Administered 2014-09-28: 104 mg via SUBCUTANEOUS

## 2014-10-06 IMAGING — CT CT MAXILLOFACIAL W/ CM
3 series · 16 of 47 positions shown, 19 images · non-contrast
Comparison: none

[Series 4: facial bones · axial · 0.43mm/px · z∈[+36,+154]mm · 10 of 69 slices shown, 13 images]
[im 5/69  brain]
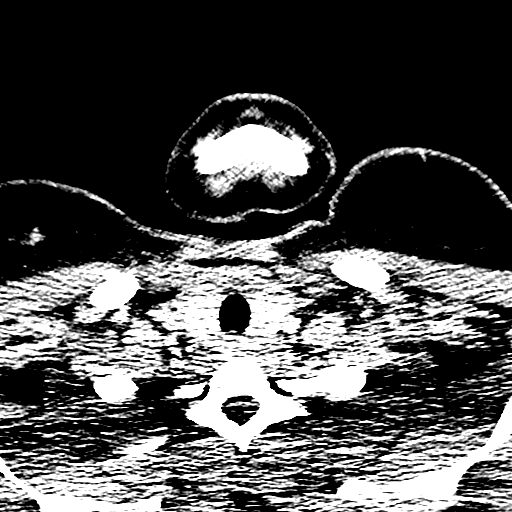
[im 5/69  bone]
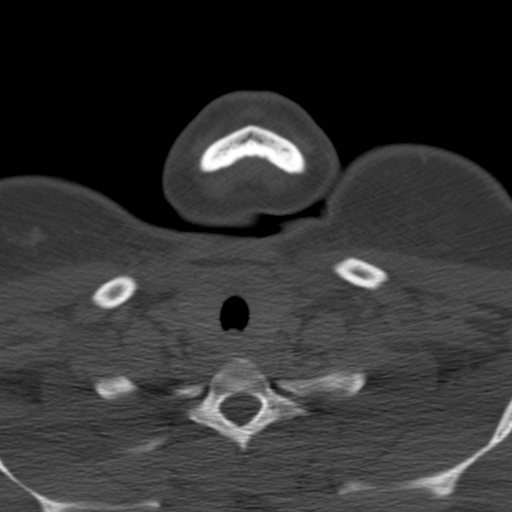
[im 12/69  bone]
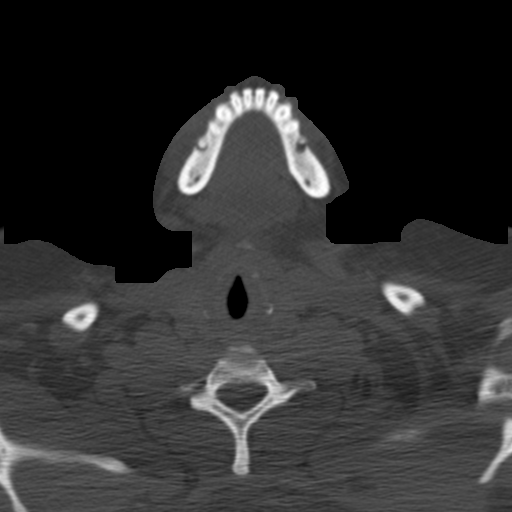
[im 19/69  bone]
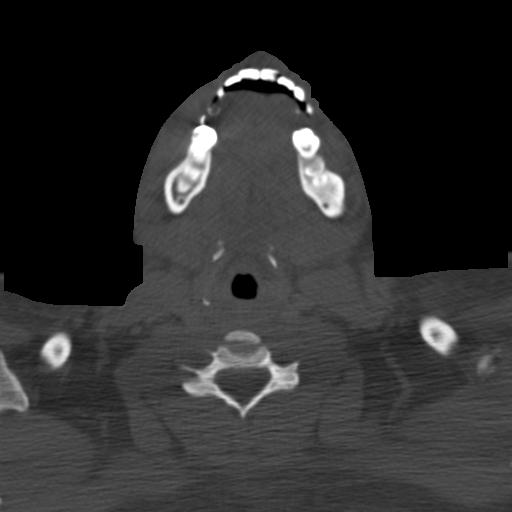
[im 24/69  bone]
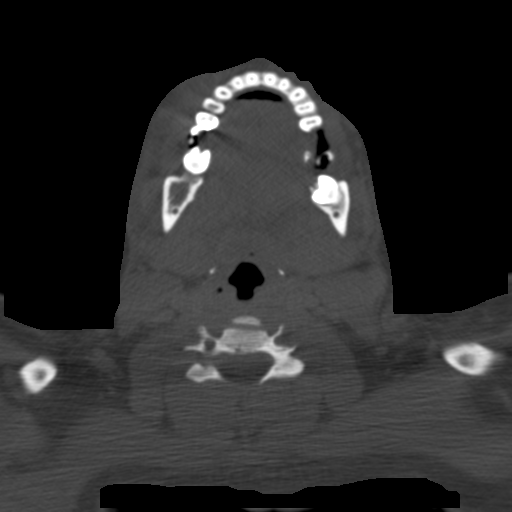
[im 31/69  brain]
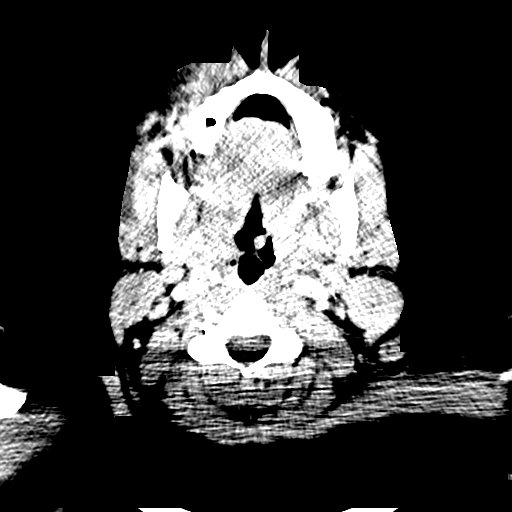
[im 31/69  bone]
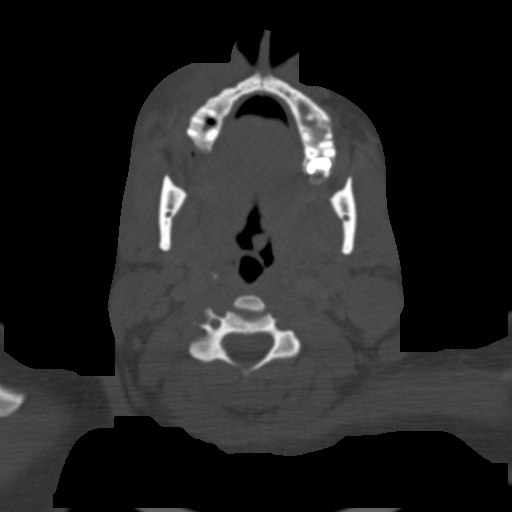
[im 38/69  bone]
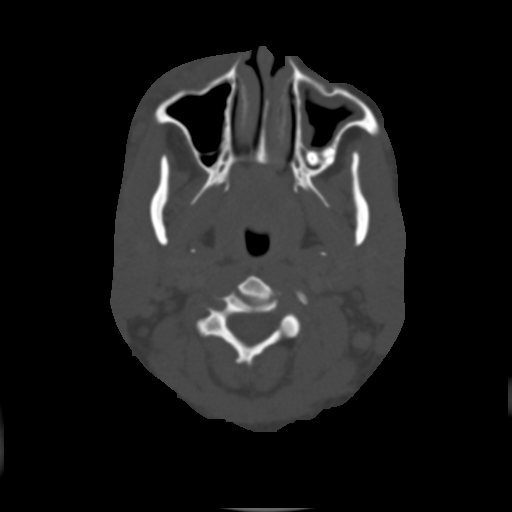
[im 45/69  bone]
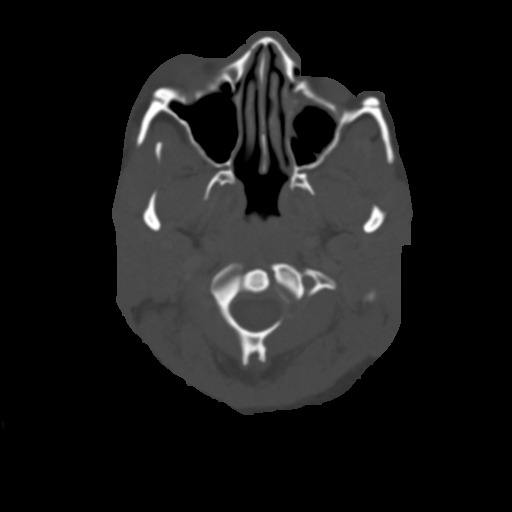
[im 52/69  bone]
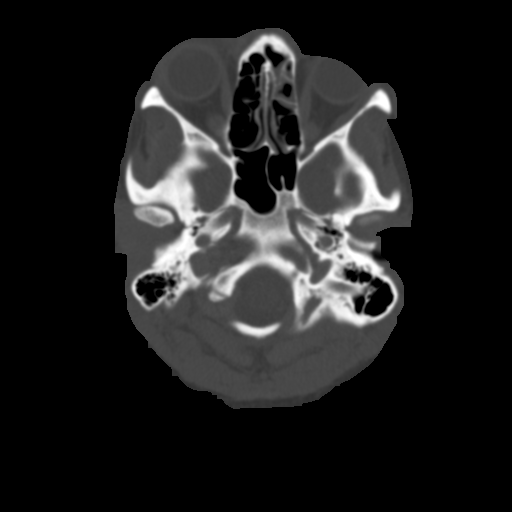
[im 57/69  brain]
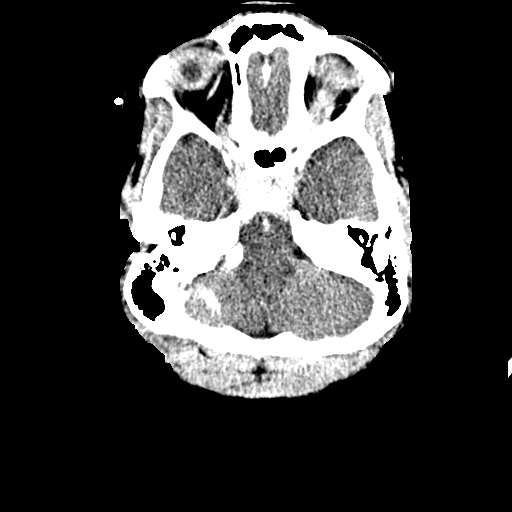
[im 57/69  bone]
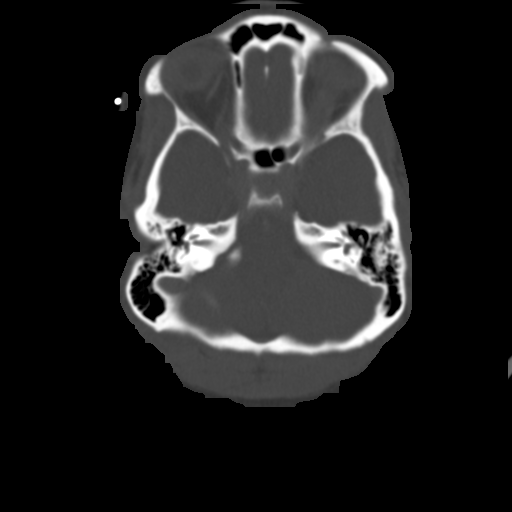
[im 64/69  bone]
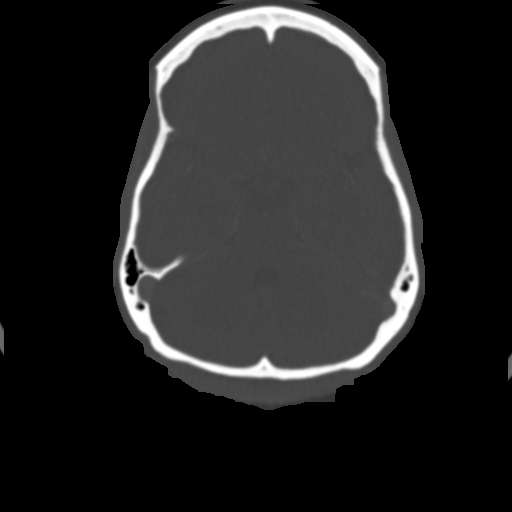

[sag · sagittal · 0.43mm/px · 3 of 68 slices shown]
[im 23/68  bone]
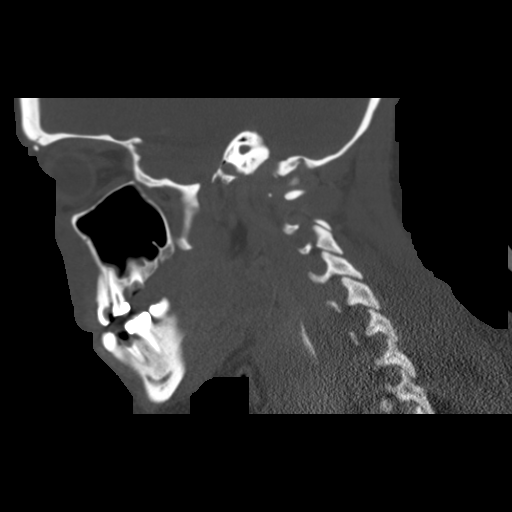
[im 34/68  bone]
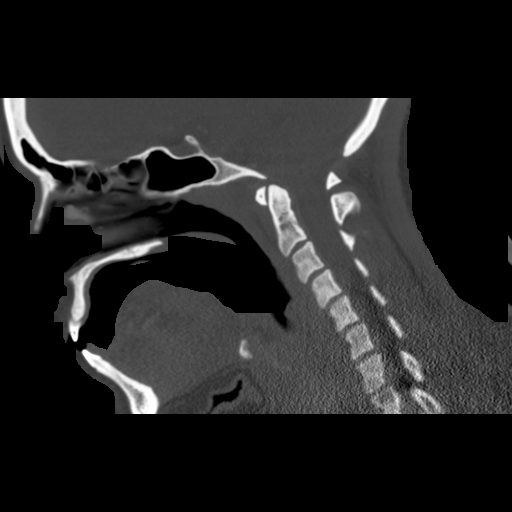
[im 45/68  bone]
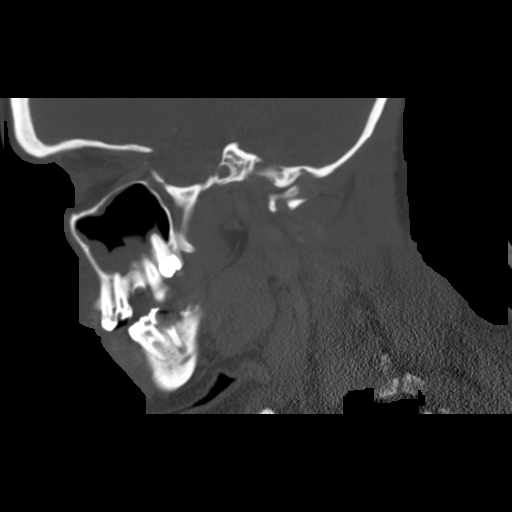

[mpr, coronal std, coronal · coronal · 0.43mm/px · 3 of 82 slices shown]
[im 28/82  bone]
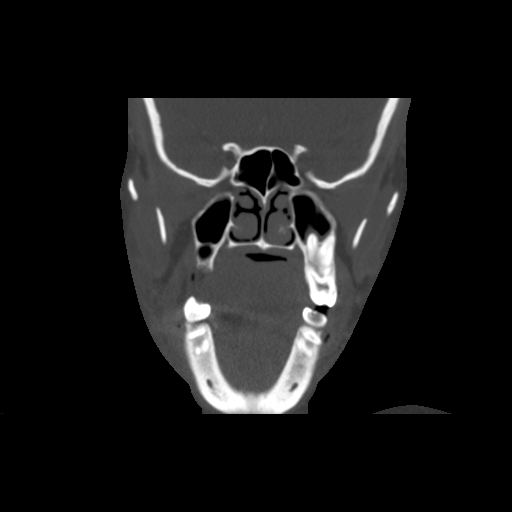
[im 37/82  bone]
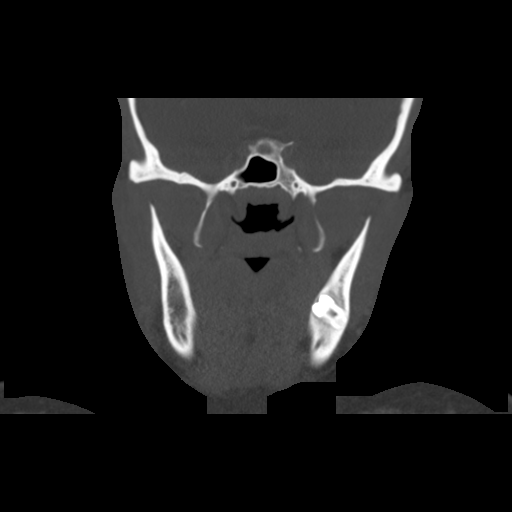
[im 46/82  bone]
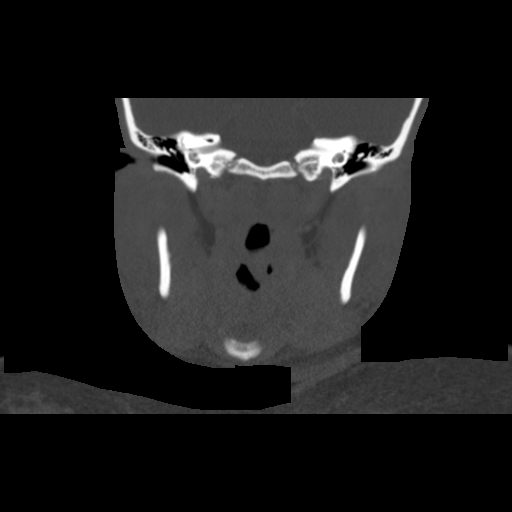

[16 of 47 positions shown; findings below may reference images not displayed]

CLINICAL DATA
Right eye pain and swelling

EXAM
CT MAXILLOFACIAL WITH CONTRAST

TECHNIQUE
Multidetector CT imaging of the maxillofacial structures was
performed with intravenous contrast. Multiplanar CT image
reconstructions were also generated. A small metallic BB was placed
on the right temple in order to reliably differentiate right from
left.

CONTRAST
80mL OMNIPAQUE IOHEXOL 300 MG/ML  SOLN

COMPARISON
None.

FINDINGS
Patient is pregnant and was shielded for the CT of the face.

There is moderate soft tissue swelling around the right eye
involving the upper and lower eyelid. There is stranding in the
subcutaneous fat of the right maxilla. Findings are most compatible
with cellulitis. No soft tissue abscess. This is periorbital
cellulitis and does not extend into the orbit. No orbital abscess.
The globe is normal bilaterally. Extraocular muscles are normal.

There is a foreign body in the soft tissues medial to the medial
canthus of the right orbit. There is surrounding soft tissue
swelling but no abscess. This could be a piece of glass or plastic
or possibly metal. Correlate with history. This could be an
incidental finding however could be related to ongoing infection.

Mucosal edema is present in the left maxillary sinus and left
ethmoid sinus. No air-fluid level.

Multiple dental caries. Multiple areas of periapical abscess and
extensive dental infection.

IMPRESSION
Periorbital cellulitis on the right without abscess or extension
into the orbit. There is a radiopaque foreign bodies in the soft
tissues medial to the right medial canthus. This foreign body
measures 2 x 5 mm and could be an incidental finding however
correlation with history is suggested.

Severe dental infection

Chronic sinusitis.

SIGNATURE

## 2014-10-08 ENCOUNTER — Encounter (HOSPITAL_COMMUNITY): Payer: Self-pay | Admitting: Emergency Medicine

## 2014-11-05 ENCOUNTER — Emergency Department (HOSPITAL_COMMUNITY)
Admission: EM | Admit: 2014-11-05 | Discharge: 2014-11-05 | Disposition: A | Discharge status: Home / Self Care | Payer: Medicaid Other | Attending: Emergency Medicine | Admitting: Emergency Medicine

## 2014-11-05 ENCOUNTER — Encounter (HOSPITAL_COMMUNITY): Payer: Self-pay | Admitting: Emergency Medicine

## 2014-11-05 DIAGNOSIS — J45909 Unspecified asthma, uncomplicated: Secondary | ICD-10-CM | POA: Insufficient documentation

## 2014-11-05 DIAGNOSIS — Z720 Tobacco use: Secondary | ICD-10-CM | POA: Insufficient documentation

## 2014-11-05 DIAGNOSIS — R0981 Nasal congestion: Secondary | ICD-10-CM | POA: Insufficient documentation

## 2014-11-05 DIAGNOSIS — H6092 Unspecified otitis externa, left ear: Secondary | ICD-10-CM

## 2014-11-05 DIAGNOSIS — H9202 Otalgia, left ear: Secondary | ICD-10-CM

## 2014-11-05 DIAGNOSIS — Z792 Long term (current) use of antibiotics: Secondary | ICD-10-CM | POA: Insufficient documentation

## 2014-11-05 DIAGNOSIS — E119 Type 2 diabetes mellitus without complications: Secondary | ICD-10-CM | POA: Insufficient documentation

## 2014-11-05 LAB — CBG MONITORING, ED: Glucose-Capillary: 291 mg/dL — ABNORMAL HIGH (ref 70–99)

## 2014-11-05 MED ORDER — NEOMYCIN-POLYMYXIN-HC 3.5-10000-1 OT SUSP: 4.0000 [drp] | Freq: Four times a day (QID) | OTIC | Status: DC

## 2014-11-05 MED ORDER — HYDROCODONE-ACETAMINOPHEN 5-325 MG PO TABS
2.0000 | ORAL_TABLET | Freq: Once | ORAL | Status: AC
  Administered 2014-11-05: 2 via ORAL
  Filled 2014-11-05: qty 2

## 2014-11-05 MED ORDER — ACETAMINOPHEN 325 MG PO TABS: 650.0000 mg | ORAL_TABLET | Freq: Once | ORAL | Status: DC

## 2014-11-05 NOTE — ED Provider Notes (Signed)
CSN: [id number]     Arrival date & time 11/05/14  [id number] History   First MD Initiated Contact with Patient 11/05/14 [phone]     Chief Complaint  Patient presents with  . Otalgia    left     (Consider location/radiation/quality/duration/timing/severity/associated sxs/prior Treatment) HPI Comments: 23 yo with controlled DM presents with left ear pain for 2 days and congestion.  No drainage, no fevers or vomiting, worse with moving ear, pt is not a swimmer.    Patient is a 23 y.o. female presenting with ear pain. The history is provided by the patient.  Otalgia Associated symptoms: congestion   Associated symptoms: no abdominal pain, no fever, no headaches, no neck pain, no rash, no sore throat and no vomiting     Past Medical History  Diagnosis Date  . Asthma   . Medical history non-contributory   . 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 Tooth extraction     Family History  Problem Relation Age of Onset  . Diabetes Mother   . Diabetes Maternal Grandmother    History  Substance Use Topics  . Smoking status: Current Every Day Smoker -- 0.25 packs/day    Types: Cigarettes  . Smokeless tobacco: Former NeurosurgeonUser  . Alcohol Use: No   OB History    Gravida Para Term Preterm AB TAB SAB Ectopic Multiple Living   3 3 3  0 0 0 0 0 0 3     Review of Systems  Constitutional: Negative for fever.  HENT: Positive for congestion and ear pain. Negative for sore throat.   Eyes: Negative for visual disturbance.  Gastrointestinal: Negative for vomiting and abdominal pain.  Musculoskeletal: Negative for neck pain and neck stiffness.  Skin: Negative for rash.  Neurological: Negative for light-headedness and headaches.      Allergies  Food and Watermelon  Home Medications   Prior to Admission medications   Medication Sig Start Date End Date Taking? Authorizing Provider  HYDROcodone-acetaminophen  (NORCO/VICODIN) 5-325 MG per tablet Take 1-2 tablets by mouth every 6 (six) hours as needed for moderate pain or severe pain. 07/09/14   Antony MaduraKelly Humes, PA-C  HYDROcodone-acetaminophen (NORCO/VICODIN) 5-325 MG per tablet Take 1 tablet by mouth every 6 (six) hours as needed for moderate pain. 08/12/14   Jamesetta Orleanshristopher W Lawyer, PA-C  ibuprofen (ADVIL,MOTRIN) 200 MG tablet Take 400 mg by mouth every 6 (six) hours as needed for moderate pain.    Historical Provider, MD  ibuprofen (ADVIL,MOTRIN) 800 MG tablet Take 1 tablet (800 mg total) by mouth every 8 (eight) hours as needed. 08/12/14   Jamesetta Orleanshristopher W Lawyer, PA-C  neomycin-polymyxin-hydrocortisone (CORTISPORIN) 3.5-10000-1 otic suspension Place 4 drops into both ears 4 (four) times daily. X 7 days 11/05/14   Enid SkeensJoshua M Nicolemarie Wooley, MD  penicillin v potassium (VEETID) 500 MG tablet Take 1 tablet (500 mg total) by mouth 4 (four) times daily. 08/12/14   Jamesetta Orleanshristopher W Lawyer, PA-C   BP 120/68 mmHg  Pulse 97  Temp(Src) 98 F (36.7 C) (Oral)  Resp 18  SpO2 100%  Breastfeeding? No Physical Exam  Constitutional: She is oriented to person, place, and time. She appears well-developed and well-nourished.  HENT:  Head: Normocephalic and atraumatic.  Eyes: Conjunctivae are normal. Right eye exhibits no discharge. Left eye exhibits no discharge.  Neck: Normal range of motion. Neck supple. No tracheal deviation present.  Pulmonary/Chest: Effort normal.  Abdominal: Soft. She exhibits no distension. There is no tenderness. There is no guarding.  Musculoskeletal: She exhibits no edema.  Neurological: She is alert and oriented to person, place, and time.  Skin: Skin is warm. No rash noted.  Psychiatric: She has a normal mood and affect.  Nursing note and vitals reviewed.   ED Course  Procedures (including critical care time) Labs Review Labs Reviewed  CBG MONITORING, ED - Abnormal; Notable for the following:    Glucose-Capillary 291 (*)    All other components within  normal limits  URINALYSIS, ROUTINE W REFLEX MICROSCOPIC  I-STAT CHEM 8, ED    Imaging Review No results found.   EKG Interpretation None      MDM   Final diagnoses:  Acute pain of left ear  External otitis of left ear   Patient well-appearing with clinically external otitis/effusion. Afebrile and heart rate normalized with time and pain medicines. With glucose elevated diabetic recommended urine/BMP to look for signs of early DKA. Patient says she did not take her diabetic meds this morning and does not want any further workup and will take her meds at home. Patient has capacity make decisions and will return for worsening symptoms.  Results and differential diagnosis were discussed with the patient/parent/guardian. Close follow up outpatient was discussed, comfortable with the plan.   Medications  HYDROcodone-acetaminophen (NORCO/VICODIN) 5-325 MG per tablet 2 tablet (2 tablets Oral Given 11/05/14 0937)    Filed Vitals:   11/05/14 0905 11/05/14 0957  BP: 125/74 120/68  Pulse: 118 97  Temp: 98 F (36.7 C)   TempSrc: Oral   Resp: 20 18  SpO2: 100% 100%    Final diagnoses:  Acute pain of left ear  External otitis of left ear        Enid SkeensJoshua M Emireth Cockerham, MD 11/05/14 1001

## 2014-11-05 NOTE — Discharge Instructions (Signed)
If you were given medicines take as directed.  If you are on coumadin or contraceptives realize their levels and effectiveness is altered by many different medicines.  If you have any reaction (rash, tongues swelling, other) to the medicines stop taking and see a physician.   Please follow up as directed and return to the ER or see a physician for new or worsening symptoms.  Thank you. Filed Vitals:   11/05/14 0905 11/05/14 0957  BP: 125/74 120/68  Pulse: 118 97  Temp: 98 F (36.7 C)   TempSrc: Oral   Resp: 20 18  SpO2: 100% 100%

## 2014-11-05 NOTE — ED Notes (Signed)
Pt reports not taking her normal 1000mg  of Metformin this am.

## 2014-11-05 NOTE — ED Notes (Signed)
Pt refusing blood work and urine. She wants to return home and take her Metformin instead. She was instructed to return to the ED if her glucose does not return to normal or if other hyperglyemic problems arise.

## 2014-11-05 NOTE — ED Notes (Signed)
Pt reports left earache for 2 days. Denies drainage. Also reports left face pain and headache. No fever.

## 2014-11-08 ENCOUNTER — Encounter: Payer: Self-pay | Admitting: Obstetrics & Gynecology

## 2014-12-21 ENCOUNTER — Ambulatory Visit: Payer: Medicaid Other

## 2015-05-21 ENCOUNTER — Telehealth: Payer: Self-pay | Admitting: *Deleted

## 2015-05-21 NOTE — Telephone Encounter (Signed)
Pt left message stating that she wants a blood pregnancy test. I returned her call and discussed her concern. She stated that she has not had a period for 2 months. She has taken several home pregnancy tests which were negative and now wants to come in for a blood test. I advised pt that there are other reasons for not having a period besides pregnancy. Most likely the home tests are correct and she is not pregnant. We will schedule an appt with a provider in regards to her lack of periods. It may be a month until the appt and she may even have a period by that time. Pt states that she is sexually active and usus condoms sometimes. I advised pt to use condoms with each sexual encounter to prevent sexually transmitted infection as well as undesired pregnancy. Pt voiced understanding and will await a call with appt information. She stated that a message can be left on her voice mail.

## 2015-05-22 ENCOUNTER — Ambulatory Visit: Payer: Medicaid Other

## 2015-05-29 ENCOUNTER — Ambulatory Visit: Payer: Medicaid Other

## 2015-06-02 ENCOUNTER — Encounter (HOSPITAL_COMMUNITY): Payer: Self-pay | Admitting: Emergency Medicine

## 2015-06-02 ENCOUNTER — Emergency Department (HOSPITAL_COMMUNITY)
Admission: EM | Admit: 2015-06-02 | Discharge: 2015-06-02 | Disposition: A | Discharge status: Home / Self Care | Payer: Medicaid Other | Attending: Emergency Medicine | Admitting: Emergency Medicine

## 2015-06-02 DIAGNOSIS — Z792 Long term (current) use of antibiotics: Secondary | ICD-10-CM | POA: Insufficient documentation

## 2015-06-02 DIAGNOSIS — E119 Type 2 diabetes mellitus without complications: Secondary | ICD-10-CM | POA: Insufficient documentation

## 2015-06-02 DIAGNOSIS — K029 Dental caries, unspecified: Secondary | ICD-10-CM

## 2015-06-02 DIAGNOSIS — J45909 Unspecified asthma, uncomplicated: Secondary | ICD-10-CM | POA: Insufficient documentation

## 2015-06-02 DIAGNOSIS — Z720 Tobacco use: Secondary | ICD-10-CM | POA: Diagnosis not present

## 2015-06-02 DIAGNOSIS — K088 Other specified disorders of teeth and supporting structures: Secondary | ICD-10-CM | POA: Diagnosis present

## 2015-06-02 MED ORDER — HYDROCODONE-ACETAMINOPHEN 5-325 MG PO TABS
2.0000 | ORAL_TABLET | Freq: Once | ORAL | Status: AC
  Administered 2015-06-02: 2 via ORAL
  Filled 2015-06-02: qty 2

## 2015-06-02 MED ORDER — AMOXICILLIN 500 MG PO CAPS: 500.0000 mg | ORAL_CAPSULE | Freq: Three times a day (TID) | ORAL | Status: DC

## 2015-06-02 MED ORDER — AMOXICILLIN 500 MG PO CAPS
500.0000 mg | ORAL_CAPSULE | Freq: Once | ORAL | Status: AC
  Administered 2015-06-02: 500 mg via ORAL
  Filled 2015-06-02: qty 1

## 2015-06-02 MED ORDER — HYDROCODONE-ACETAMINOPHEN 5-325 MG PO TABS: 2.0000 | ORAL_TABLET | ORAL | Status: DC | PRN

## 2015-06-02 NOTE — Discharge Instructions (Signed)
Dental Caries Follow-up with a dentist for dental pain. Dental caries is tooth decay. This decay can cause a hole in teeth (cavity) that can get bigger and deeper over time. HOME CARE  Brush and floss your teeth. Do this at least two times a day.  Use a fluoride toothpaste.  Use a mouth rinse if told by your dentist or doctor.  Eat less sugary and starchy foods. Drink less sugary drinks.  Avoid snacking often on sugary and starchy foods. Avoid sipping often on sugary drinks.  Keep regular checkups and cleanings with your dentist.  Use fluoride supplements if told by your dentist or doctor.  Allow fluoride to be applied to teeth if told by your dentist or doctor. Document Released: 09/01/2008 Document Revised: 04/09/2014 Document Reviewed: 11/25/2012 [facility] Patient Information 2015 [place], [place]. This information is not intended to replace advice given to you by your health care provider. Make sure you discuss any questions you have with your health care provider.

## 2015-06-02 NOTE — ED Notes (Signed)
Patient c/o dental pain x3 days. Patient states pain is left lower mouth. Patient does not have a Education officer, community.

## 2015-06-02 NOTE — ED Provider Notes (Signed)
CSN: [id number]     Arrival date & time 06/02/15  1537 History   First MD Initiated Contact with Patient 06/02/15 1541     Chief Complaint  Patient presents with  . Dental Pain    left lower     (Consider location/radiation/quality/duration/timing/severity/associated sxs/prior Treatment) The history is provided by the patient. No language interpreter was used.   Cindy Bruce is a 24 year old female with a history of asthma, diabetes, dental pain and extraction who presents for dental pain that began 3 days ago. She states she took ibuprofen and Advil without relief. She states part of her right lower tooth in the back chipped off one week ago. She also states she's had dental abscesses and extractions in the past. She denies any fever, chills, drooling, shortness of breath, difficulty breathing, facial swelling. Past Medical History  Diagnosis Date  . Asthma   . Medical history non-contributory   . 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 Tooth extraction     Family History  Problem Relation Age of Onset  . Diabetes Mother   . Diabetes Maternal Grandmother    History  Substance Use Topics  . Smoking status: Current Every Day Smoker -- 1.00 packs/day    Types: Cigarettes  . Smokeless tobacco: Former Neurosurgeon  . Alcohol Use: No     Comment: occ   OB History    Gravida Para Term Preterm AB TAB SAB Ectopic Multiple Living   3 3 3  0 0 0 0 0 0 3     Review of Systems  Constitutional: Negative for fever and chills.  HENT: Positive for dental problem. Negative for drooling, ear pain, facial swelling and trouble swallowing.       Allergies  Food and Watermelon  Home Medications   Prior to Admission medications   Medication Sig Start Date End Date Taking? Authorizing Provider  amoxicillin (AMOXIL) 500 MG capsule Take 1 capsule (500 mg total) by mouth 3 (three) times daily. 06/02/15   Rhia Blatchford Patel-Mills,  PA-C  HYDROcodone-acetaminophen (NORCO/VICODIN) 5-325 MG per tablet Take 2 tablets by mouth every 4 (four) hours as needed. 06/02/15   Alecia Doi Patel-Mills, PA-C  ibuprofen (ADVIL,MOTRIN) 200 MG tablet Take 400 mg by mouth every 6 (six) hours as needed for moderate pain.    Historical Provider, MD  ibuprofen (ADVIL,MOTRIN) 800 MG tablet Take 1 tablet (800 mg total) by mouth every 8 (eight) hours as needed. 08/12/14   [name], PA-C  neomycin-polymyxin-hydrocortisone (CORTISPORIN) 3.5-10000-1 otic suspension Place 4 drops into both ears 4 (four) times daily. X 7 days 11/05/14   Blane Ohara, MD  penicillin v potassium (VEETID) 500 MG tablet Take 1 tablet (500 mg total) by mouth 4 (four) times daily. 08/12/14   Christopher Lawyer, PA-C   BP 122/78 mmHg  Pulse 96  Temp(Src) 98.5 F (36.9 C) (Oral)  Resp 18  Ht 5\' 1"  (1.549 m)  SpO2 100%  LMP 04/02/2015 (Approximate) Physical Exam  Constitutional: She is oriented to person, place, and time. She appears well-developed and well-nourished.  HENT:  Head: Normocephalic.  Mouth/Throat:    Eyes: Conjunctivae are normal.  Neck: Normal range of motion.  Cardiovascular: Normal rate.   Pulmonary/Chest: Effort normal and breath sounds normal.  Neurological: She is alert and oriented to person, place, and time.  Skin: Skin is warm and dry.  Nursing note and vitals reviewed.  ED Course  Procedures (including critical care time) Labs Review Labs Reviewed - No data to display  Imaging Review No results found.   EKG Interpretation None      MDM   Final diagnoses:  Dental caries   vitals are stable. Patient has no signs of Ludwig angina. She is in no acute distress and is well-appearing. She states she has had dental pain in the past. I will give her referral to several dentists. I gave the patient return precautions such as facial swelling, difficulty breathing, inability to drink fluids. Patient verbally agrees with the plan. I'll  also prescribe her amoxicillin and Norco.   Catha Gosselin, PA-C 06/02/15 1948  Purvis Sheffield, MD 06/03/15 [phone]

## 2015-06-03 ENCOUNTER — Other Ambulatory Visit: Payer: Self-pay

## 2015-06-17 ENCOUNTER — Ambulatory Visit: Payer: Medicaid Other | Admitting: Family Medicine

## 2015-07-03 ENCOUNTER — Emergency Department (HOSPITAL_COMMUNITY)
Admission: EM | Admit: 2015-07-03 | Discharge: 2015-07-03 | Disposition: A | Discharge status: Home / Self Care | Payer: Medicaid Other | Attending: Emergency Medicine | Admitting: Emergency Medicine

## 2015-07-03 ENCOUNTER — Encounter (HOSPITAL_COMMUNITY): Payer: Self-pay | Admitting: *Deleted

## 2015-07-03 DIAGNOSIS — M67431 Ganglion, right wrist: Secondary | ICD-10-CM | POA: Insufficient documentation

## 2015-07-03 DIAGNOSIS — E119 Type 2 diabetes mellitus without complications: Secondary | ICD-10-CM | POA: Insufficient documentation

## 2015-07-03 DIAGNOSIS — G8929 Other chronic pain: Secondary | ICD-10-CM | POA: Insufficient documentation

## 2015-07-03 DIAGNOSIS — Z720 Tobacco use: Secondary | ICD-10-CM | POA: Insufficient documentation

## 2015-07-03 DIAGNOSIS — Z792 Long term (current) use of antibiotics: Secondary | ICD-10-CM | POA: Diagnosis not present

## 2015-07-03 DIAGNOSIS — J45909 Unspecified asthma, uncomplicated: Secondary | ICD-10-CM | POA: Diagnosis not present

## 2015-07-03 DIAGNOSIS — M25531 Pain in right wrist: Secondary | ICD-10-CM | POA: Diagnosis present

## 2015-07-03 MED ORDER — IBUPROFEN 400 MG PO TABS: 400.0000 mg | ORAL_TABLET | Freq: Four times a day (QID) | ORAL | Status: DC | PRN

## 2015-07-03 NOTE — ED Provider Notes (Signed)
CSN: [id number]     Arrival date & time 07/03/15  2257 History  This chart was scribed for Earley Favor, NP working with Blake Divine, MD by Placido Sou, ED Scribe. This patient was seen in room WTR5/WTR5 and the patient's care was started at 11:14 PM.    Chief Complaint  Patient presents with  . Wrist Pain  . Hand Pain   Patient is a 24 y.o. female presenting with hand pain. The history is provided by the patient. No language interpreter was used.  Hand Pain    HPI Comments: Cindy Bruce is a 24 y.o. female, with a hx of IDDM and is right hand dominant, who presents to the Emergency Department complaining of chronic mild right wrist and hand pain with onset 4 years ago and a recent worsening of her symptoms. She describes the pain as burning and notes a radiation of pain to her right elbow. Pt notes doing a lot of lifting at work and denies wearing any type of brace. Pt denies any other associated symptoms.   Past Medical History  Diagnosis Date  . Asthma   . Medical history non-contributory   . 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 Tooth extraction     Family History  Problem Relation Age of Onset  . Diabetes Mother   . Diabetes Maternal Grandmother    History  Substance Use Topics  . Smoking status: Current Every Day Smoker -- 1.00 packs/day    Types: Cigarettes  . Smokeless tobacco: Former Neurosurgeon  . Alcohol Use: No     Comment: occ   OB History    Gravida Para Term Preterm AB TAB SAB Ectopic Multiple Living   0 0 0 0 0 0 3     Review of Systems  Musculoskeletal: Positive for arthralgias. Negative for joint swelling.  All other systems reviewed and are negative.  Allergies  Food and Watermelon  Home Medications   Prior to Admission medications   Medication Sig Start Date End Date Taking? Authorizing Provider  amoxicillin (AMOXIL) 500 MG capsule Take 1 capsule (500 mg total)  by mouth 3 (three) times daily. 06/02/15   Hanna Patel-Mills, PA-C  HYDROcodone-acetaminophen (NORCO/VICODIN) 5-325 MG per tablet Take 2 tablets by mouth every 4 (four) hours as needed. 06/02/15   Hanna Patel-Mills, PA-C  ibuprofen (ADVIL,MOTRIN) 400 MG tablet Take 1 tablet (400 mg total) by mouth every 6 (six) hours as needed. 07/03/15   Earley Favor, NP  neomycin-polymyxin-hydrocortisone (CORTISPORIN) 3.5-10000-1 otic suspension Place 4 drops into both ears 4 (four) times daily. X 7 days 11/05/14   Blane Ohara, MD  penicillin v potassium (VEETID) 500 MG tablet Take 1 tablet (500 mg total) by mouth 4 (four) times daily. 08/12/14   Christopher Lawyer, PA-C   BP 126/84 mmHg  Pulse 108  Temp(Src) 98 F (36.7 C) (Oral)  Resp 16  SpO2 99% Physical Exam  Constitutional: She is oriented to person, place, and time. She appears well-developed and well-nourished. No distress.  HENT:  Head: Normocephalic and atraumatic.  Mouth/Throat: Oropharynx is clear and moist.  Eyes: Conjunctivae and EOM are normal. Pupils are equal, round, and reactive to light.  Neck: Normal range of motion. Neck supple. No tracheal deviation present.  Cardiovascular: Normal rate.   Pulmonary/Chest: Breath sounds normal. No respiratory distress.  Abdominal: Soft.  Musculoskeletal: Normal range of  motion. She exhibits tenderness. She exhibits no edema.       Hands: Note discoloration, swelling, weakness  Full range of motion of all digits.  Slight discomfort with dorsiflexion  Neurological: She is alert and oriented to person, place, and time.  Skin: Skin is warm and dry.  Psychiatric: She has a normal mood and affect. Her behavior is normal.  Nursing note and vitals reviewed.   ED Course  Procedures  DIAGNOSTIC STUDIES: Oxygen Saturation is 99% on RA, normal by my interpretation.    COORDINATION OF CARE: 11:17 PM Discussed treatment plan with pt at bedside and pt agreed to plan.  Labs Review Labs Reviewed - No data  to display  Imaging Review No results found.   EKG Interpretation None    Patient has a small ganglion cyst over the flexor tendon on the lateral aspect of her wrist.  She been placed in a Velcro wrist splint given ibuprofen on a regular basis.  Told to follow-up with hand surgeryif she sees no improvement in next week  MDM   Final diagnoses:  Wrist pain, chronic, right  Ganglion cyst of wrist, right    I personally performed the services described in this documentation, which was scribed in my presence. The recorded information has been reviewed and is accurate.   Earley Favor, NP 07/03/15 2326  Earley Favor, NP 07/03/15 1610  Tomasita Crumble, MD 07/04/15 [id number]

## 2015-07-03 NOTE — ED Notes (Signed)
Pt reports pain in her R wrist and R hand started 4 years ago.  Getting worse.  Pt reports burning sensation in between her R thumb and index finger.  Unable to move her wrist d/t pain.  No swelling noted at this time.  Radial pulse noted.

## 2015-07-03 NOTE — ED Notes (Signed)
See triage note.

## 2015-07-03 NOTE — Discharge Instructions (Signed)
You've been placed in a wrist splint for support.  I also recommended to take ibuprofen on a regular basis, 3 times a day if you do not see an improvement in your discomfort  Please make an appointment with Dr. Melvyn Novas for further evaluation

## 2015-07-17 ENCOUNTER — Emergency Department (HOSPITAL_COMMUNITY)
Admission: EM | Admit: 2015-07-17 | Discharge: 2015-07-17 | Discharge status: Against Medical Advice | Payer: Medicaid Other | Attending: Emergency Medicine | Admitting: Emergency Medicine

## 2015-07-17 ENCOUNTER — Encounter (HOSPITAL_COMMUNITY): Payer: Self-pay | Admitting: Emergency Medicine

## 2015-07-17 DIAGNOSIS — Z9189 Other specified personal risk factors, not elsewhere classified: Secondary | ICD-10-CM

## 2015-07-17 DIAGNOSIS — J45909 Unspecified asthma, uncomplicated: Secondary | ICD-10-CM | POA: Diagnosis not present

## 2015-07-17 DIAGNOSIS — E119 Type 2 diabetes mellitus without complications: Secondary | ICD-10-CM | POA: Diagnosis not present

## 2015-07-17 DIAGNOSIS — K088 Other specified disorders of teeth and supporting structures: Secondary | ICD-10-CM | POA: Insufficient documentation

## 2015-07-17 DIAGNOSIS — Z720 Tobacco use: Secondary | ICD-10-CM | POA: Diagnosis not present

## 2015-07-17 NOTE — ED Notes (Signed)
Pt ambulating out of room, sts "can't wait anymore, have to go".  NAD upon leaving dept.  Pt encouraged to return if needed.

## 2015-08-12 ENCOUNTER — Emergency Department (HOSPITAL_COMMUNITY)
Admission: EM | Admit: 2015-08-12 | Discharge: 2015-08-12 | Disposition: A | Discharge status: Home / Self Care | Payer: Medicaid Other | Attending: Emergency Medicine | Admitting: Emergency Medicine

## 2015-08-12 ENCOUNTER — Emergency Department (HOSPITAL_COMMUNITY): Payer: Medicaid Other

## 2015-08-12 ENCOUNTER — Encounter (HOSPITAL_COMMUNITY): Payer: Self-pay | Admitting: Emergency Medicine

## 2015-08-12 DIAGNOSIS — J45901 Unspecified asthma with (acute) exacerbation: Secondary | ICD-10-CM | POA: Insufficient documentation

## 2015-08-12 DIAGNOSIS — Y9289 Other specified places as the place of occurrence of the external cause: Secondary | ICD-10-CM | POA: Diagnosis not present

## 2015-08-12 DIAGNOSIS — X58XXXA Exposure to other specified factors, initial encounter: Secondary | ICD-10-CM | POA: Insufficient documentation

## 2015-08-12 DIAGNOSIS — Z720 Tobacco use: Secondary | ICD-10-CM | POA: Diagnosis not present

## 2015-08-12 DIAGNOSIS — J40 Bronchitis, not specified as acute or chronic: Secondary | ICD-10-CM

## 2015-08-12 DIAGNOSIS — J209 Acute bronchitis, unspecified: Secondary | ICD-10-CM | POA: Diagnosis not present

## 2015-08-12 DIAGNOSIS — Y9389 Activity, other specified: Secondary | ICD-10-CM | POA: Insufficient documentation

## 2015-08-12 DIAGNOSIS — E119 Type 2 diabetes mellitus without complications: Secondary | ICD-10-CM | POA: Diagnosis not present

## 2015-08-12 DIAGNOSIS — R0981 Nasal congestion: Secondary | ICD-10-CM | POA: Diagnosis present

## 2015-08-12 DIAGNOSIS — S46912A Strain of unspecified muscle, fascia and tendon at shoulder and upper arm level, left arm, initial encounter: Secondary | ICD-10-CM | POA: Diagnosis not present

## 2015-08-12 DIAGNOSIS — Y998 Other external cause status: Secondary | ICD-10-CM | POA: Diagnosis not present

## 2015-08-12 MED ORDER — METHOCARBAMOL 500 MG PO TABS: 500.0000 mg | ORAL_TABLET | Freq: Two times a day (BID) | ORAL | Status: DC

## 2015-08-12 MED ORDER — AZITHROMYCIN 250 MG PO TABS: ORAL_TABLET | ORAL | Status: DC

## 2015-08-12 MED ORDER — ALBUTEROL SULFATE HFA 108 (90 BASE) MCG/ACT IN AERS: 2.0000 | INHALATION_SPRAY | RESPIRATORY_TRACT | Status: DC | PRN

## 2015-08-12 NOTE — ED Notes (Addendum)
Pt c/o congestion, SOB, and chest wall pain x 3 days. Pt has hx of asthma. Pt sts she does not have an inhaler. Pt sts "I feel like I'm getting a head cold."  Pt c/o productive cough, sputum green in color. Pt speaking in complete sentences, NAD noted. Deneis N/V/fever. Pt c/o central chest pain that is tender to palpation and worsens upon movement and coughing.   Pt also c/o L shoulder pain, denies injury. Pt sts "I would like to have that looked at too." Pt sts it has been hurting 2-3 weeks.

## 2015-08-12 NOTE — Discharge Instructions (Signed)
Shoulder Sprain °A shoulder sprain is the result of damage to the tough, fiber-like tissues (ligaments) that help hold your shoulder in place. The ligaments may be stretched or torn. Besides the main shoulder joint (the ball and socket), there are several smaller joints that connect the bones in this area. A sprain usually involves one of those joints. Most often it is the acromioclavicular (or AC) joint. That is the joint that connects the collarbone (clavicle) and the shoulder blade (scapula) at the top point of the shoulder blade (acromion). °A shoulder sprain is a mild form of what is called a shoulder separation. Recovering from a shoulder sprain may take some time. For some, pain lingers for several months. Most people recover without long term problems. °CAUSES  °· A shoulder sprain is usually caused by some kind of trauma. This might be: °¨ Falling on an outstretched arm. °¨ Being hit hard on the shoulder. °¨ Twisting the arm. °· Shoulder sprains are more likely to occur in people who: °¨ Play sports. °¨ Have balance or coordination problems. °SYMPTOMS  °· Pain when you move your shoulder. °· Limited ability to move the shoulder. °· Swelling and tenderness on top of the shoulder. °· Redness or warmth in the shoulder. °· Bruising. °· A change in the shape of the shoulder. °DIAGNOSIS  °Your healthcare provider may: °· Ask about your symptoms. °· Ask about recent activity that might have caused those symptoms. °· Examine your shoulder. You may be asked to do simple exercises to test movement. The other shoulder will be examined for comparison. °· Order some tests that provide a look inside the body. They can show the extent of the injury. The tests could include: °¨ X-rays. °¨ CT (computed tomography) scan. °¨ MRI (magnetic resonance imaging) scan. °RISKS AND COMPLICATIONS °· Loss of full shoulder motion. °· Ongoing shoulder pain. °TREATMENT  °How long it takes to recover from a shoulder sprain depends on how  severe it was. Treatment options may include: °· Rest. You should not use the arm or shoulder until it heals. °· Ice. For 2 or 3 days after the injury, put an ice pack on the shoulder up to 4 times a day. It should stay on for 15 to 20 minutes each time. Wrap the ice in a towel so it does not touch your skin. °· Over-the-counter medicine to relieve pain. °· A sling or brace. This will keep the arm still while the shoulder is healing. °· Physical therapy or rehabilitation exercises. These will help you regain strength and motion. Ask your healthcare provider when it is OK to begin these exercises. °· Surgery. The need for surgery is rare with a sprained shoulder, but some people may need surgery to keep the joint in place and reduce pain. °HOME CARE INSTRUCTIONS  °· Ask your healthcare provider about what you should and should not do while your shoulder heals. °· Make sure you know how to apply ice to the correct area of your shoulder. °· Talk with your healthcare provider about which medications should be used for pain and swelling. °· If rehabilitation therapy will be needed, ask your healthcare provider to refer you to a therapist. If it is not recommended, then ask about at-home exercises. Find out when exercise should begin. °SEEK MEDICAL CARE IF:  °Your pain, swelling, or redness at the joint increases. °SEEK IMMEDIATE MEDICAL CARE IF:  °· You have a fever. °· You cannot move your arm or shoulder. °Document Released: 04/11/2009 Document   Revised: 02/15/2012 Document Reviewed: 04/11/2009 [employer] Patient Information 2015 [place], [place]. This information is not intended to replace advice given to you by your health care provider. Make sure you discuss any questions you have with your health care provider. Acute Bronchitis Bronchitis is inflammation of the airways that extend from the windpipe into the lungs (bronchi). The inflammation often causes mucus to develop. This leads to a cough, which is the most  common symptom of bronchitis.  In acute bronchitis, the condition usually develops suddenly and goes away over time, usually in a couple weeks. Smoking, allergies, and asthma can make bronchitis worse. Repeated episodes of bronchitis may cause further lung problems.  CAUSES Acute bronchitis is most often caused by the same virus that causes a cold. The virus can spread from person to person (contagious) through coughing, sneezing, and touching contaminated objects. SIGNS AND SYMPTOMS   Cough.   Fever.   Coughing up mucus.   Body aches.   Chest congestion.   Chills.   Shortness of breath.   Sore throat.  DIAGNOSIS  Acute bronchitis is usually diagnosed through a physical exam. Your health care provider will also ask you questions about your medical history. Tests, such as chest X-rays, are sometimes done to rule out other conditions.  TREATMENT  Acute bronchitis usually goes away in a couple weeks. Oftentimes, no medical treatment is necessary. Medicines are sometimes given for relief of fever or cough. Antibiotic medicines are usually not needed but may be prescribed in certain situations. In some cases, an inhaler may be recommended to help reduce shortness of breath and control the cough. A cool mist vaporizer may also be used to help thin bronchial secretions and make it easier to clear the chest.  HOME CARE INSTRUCTIONS  Get plenty of rest.   Drink enough fluids to keep your urine clear or pale yellow (unless you have a medical condition that requires fluid restriction). Increasing fluids may help thin your respiratory secretions (sputum) and reduce chest congestion, and it will prevent dehydration.   Take medicines only as directed by your health care provider.  If you were prescribed an antibiotic medicine, finish it all even if you start to feel better.  Avoid smoking and secondhand smoke. Exposure to cigarette smoke or irritating chemicals will make bronchitis  worse. If you are a smoker, consider using nicotine gum or skin patches to help control withdrawal symptoms. Quitting smoking will help your lungs heal faster.   Reduce the chances of another bout of acute bronchitis by washing your hands frequently, avoiding people with cold symptoms, and trying not to touch your hands to your mouth, nose, or eyes.   Keep all follow-up visits as directed by your health care provider.  SEEK MEDICAL CARE IF: Your symptoms do not improve after 1 week of treatment.  SEEK IMMEDIATE MEDICAL CARE IF:  You develop an increased fever or chills.   You have chest pain.   You have severe shortness of breath.  You have bloody sputum.   You develop dehydration.  You faint or repeatedly feel like you are going to pass out.  You develop repeated vomiting.  You develop a severe headache. MAKE SURE YOU:   Understand these instructions.  Will watch your condition.  Will get help right away if you are not doing well or get worse. Document Released: 12/31/2004 Document Revised: 04/09/2014 Document Reviewed: 05/16/2013 [facility] Patient Information 2015 [place], [place]. This information is not intended to replace advice given to you by  your health care provider. Make sure you discuss any questions you have with your health care provider. ° °

## 2015-08-12 NOTE — ED Provider Notes (Signed)
CSN: [id number]     Arrival date & time 08/12/15  7829 History   First MD Initiated Contact with Patient 08/12/15 [phone]     Chief Complaint  Patient presents with  . Nasal Congestion  . Shortness of Breath  . Shoulder Pain     (Consider location/radiation/quality/duration/timing/severity/associated sxs/prior Treatment) Patient is a 24 y.o. female presenting with shortness of breath and shoulder pain. The history is provided by the patient. No language interpreter was used.  Shortness of Breath Severity:  Moderate Onset quality:  Gradual Duration:  3 days Timing:  Constant Progression:  Worsening Chronicity:  New Context: URI   Relieved by:  Nothing Worsened by:  Nothing tried Ineffective treatments:  None tried Associated symptoms: cough   Risk factors: tobacco use   Risk factors: no family hx of DVT   Shoulder Pain Location:  Shoulder Time since incident:  3 weeks Injury: yes   Shoulder location:  L shoulder Pain details:    Quality:  Aching   Radiates to:  Does not radiate   Severity:  Moderate   Onset quality:  Gradual   Timing:  Constant   Progression:  Worsening Chronicity:  New Dislocation: no   Foreign body present:  No foreign bodies Prior injury to area:  Yes Relieved by:  Nothing Worsened by:  Nothing tried Ineffective treatments:  None tried Associated symptoms: stiffness   Pt is out of her inhaler.   Pt reports she thinks she pulled a muscle at work 3 weeks ago.  Pt reports continued shoulder pain Past Medical History  Diagnosis Date  . Asthma   . Medical history non-contributory   . 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 Tooth extraction     Family History  Problem Relation Age of Onset  . Diabetes Mother   . Diabetes Maternal Grandmother    Social History  Substance Use Topics  . Smoking status: Current Every Day Smoker -- 1.00 packs/day    Types: Cigarettes   . Smokeless tobacco: Former Neurosurgeon  . Alcohol Use: No     Comment: occ   OB History    Gravida Para Term Preterm AB TAB SAB Ectopic Multiple Living   3 3 3  0 0 0 0 0 0 3     Review of Systems  Respiratory: Positive for cough and shortness of breath.   Musculoskeletal: Positive for stiffness.  All other systems reviewed and are negative.     Allergies  Food and Watermelon  Home Medications   Prior to Admission medications   Medication Sig Start Date End Date Taking? Authorizing Provider  ibuprofen (ADVIL,MOTRIN) 200 MG tablet Take 200 mg by mouth every 6 (six) hours as needed for mild pain.    Yes Historical Provider, MD  amoxicillin (AMOXIL) 500 MG capsule Take 1 capsule (500 mg total) by mouth 3 (three) times daily. Patient not taking: Reported on 07/17/2015 06/02/15   Catha Gosselin, PA-C  HYDROcodone-acetaminophen (NORCO/VICODIN) 5-325 MG per tablet Take 2 tablets by mouth every 4 (four) hours as needed. Patient not taking: Reported on 07/17/2015 06/02/15   Catha Gosselin, PA-C  ibuprofen (ADVIL,MOTRIN) 400 MG tablet Take 1 tablet (400 mg total) by mouth every 6 (six) hours as needed. Patient not taking: Reported on 07/17/2015 07/03/15   Earley Favor, NP   BP 124/82 mmHg  Pulse 106  Temp(Src) 98.1 F (36.7 C) (Oral)  Resp 20  SpO2 100%  LMP 04/03/2015 (Approximate) Physical Exam  Constitutional: She is oriented to person, place, and time. She appears well-developed and well-nourished.  HENT:  Head: Normocephalic and atraumatic.  Right Ear: External ear normal.  Nose: Nose normal.  Mouth/Throat: Oropharynx is clear and moist.  Eyes: EOM are normal.  Neck: Normal range of motion.  Cardiovascular: Normal rate and normal heart sounds.   Pulmonary/Chest: Effort normal and breath sounds normal. She has no wheezes.  Abdominal: Soft. She exhibits no distension.  Musculoskeletal: Normal range of motion. She exhibits tenderness.  Pain left shoulder to palpation,  Pain with  range of motion,   nv and ns intact  Neurological: She is alert and oriented to person, place, and time.  Skin: Skin is warm.  Psychiatric: She has a normal mood and affect.  Nursing note and vitals reviewed.   ED Course  Procedures (including critical care time) Labs Review Labs Reviewed - No data to display  Imaging Review Dg Chest 2 View  08/12/2015   CLINICAL DATA:  Shortness of breath. Chest pain. Duration: 3 days. Diabetes.  EXAM: CHEST  2 VIEW  COMPARISON:  09/05/2012  FINDINGS: The heart size and mediastinal contours are within normal limits. Both lungs are clear. The visualized skeletal structures are unremarkable.  IMPRESSION: No active cardiopulmonary disease.   Electronically Signed   By: Gaylyn Rong M.D.   On: 08/12/2015 09:57   Dg Shoulder Left  08/12/2015   CLINICAL DATA:  Left shoulder pain.  No reported injury.  EXAM: LEFT SHOULDER - 2+ VIEW  COMPARISON:  None.  FINDINGS: There is no evidence of fracture or dislocation. There is no evidence of arthropathy or other focal bone abnormality. Soft tissues are unremarkable.  IMPRESSION: Normal examination.   Electronically Signed   By: Beckie Salts M.D.   On: 08/12/2015 10:10   I have personally reviewed and evaluated these images and lab results as part of my medical decision-making.   EKG Interpretation   Date/Time:  Monday August 12 2015 09:02:39 EDT Ventricular Rate:  106 PR Interval:  145 QRS Duration: 70 QT Interval:  358 QTC Calculation: 475 R Axis:   80 Text Interpretation:  Sinus tachycardia Probable left atrial enlargement  Borderline prolonged QT interval No significant change since last tracing  Confirmed by Ethelda Chick  MD, SAM [phone]) on 08/12/2015 9:05:35 AM      MDM   Final diagnoses:  Bronchitis  Shoulder strain, left, initial encounter    zithromax Albuterol Robaxin  Follow up with Dr. Lajoyce Corners for evaluation of shoulder if pain persist    Elson Areas, PA-C 08/12/15 1034  Doug Sou, MD 08/12/15 1743

## 2015-08-31 ENCOUNTER — Emergency Department (HOSPITAL_COMMUNITY)
Admission: EM | Admit: 2015-08-31 | Discharge: 2015-08-31 | Disposition: A | Discharge status: Home / Self Care | Payer: Medicaid Other | Attending: Emergency Medicine | Admitting: Emergency Medicine

## 2015-08-31 ENCOUNTER — Encounter (HOSPITAL_COMMUNITY): Payer: Self-pay | Admitting: Emergency Medicine

## 2015-08-31 DIAGNOSIS — K088 Other specified disorders of teeth and supporting structures: Secondary | ICD-10-CM | POA: Diagnosis not present

## 2015-08-31 DIAGNOSIS — K029 Dental caries, unspecified: Secondary | ICD-10-CM | POA: Insufficient documentation

## 2015-08-31 DIAGNOSIS — Z720 Tobacco use: Secondary | ICD-10-CM | POA: Insufficient documentation

## 2015-08-31 DIAGNOSIS — J45909 Unspecified asthma, uncomplicated: Secondary | ICD-10-CM | POA: Insufficient documentation

## 2015-08-31 DIAGNOSIS — H9202 Otalgia, left ear: Secondary | ICD-10-CM | POA: Insufficient documentation

## 2015-08-31 DIAGNOSIS — Z79899 Other long term (current) drug therapy: Secondary | ICD-10-CM | POA: Insufficient documentation

## 2015-08-31 DIAGNOSIS — K0889 Other specified disorders of teeth and supporting structures: Secondary | ICD-10-CM

## 2015-08-31 DIAGNOSIS — E119 Type 2 diabetes mellitus without complications: Secondary | ICD-10-CM | POA: Insufficient documentation

## 2015-08-31 MED ORDER — PENICILLIN V POTASSIUM 500 MG PO TABS: 500.0000 mg | ORAL_TABLET | Freq: Four times a day (QID) | ORAL | Status: AC

## 2015-08-31 NOTE — ED Provider Notes (Signed)
CSN: [id number]   Arrival date & time 08/31/15 1743  History  This chart was scribed for non-physician practitioner, Joycie Peek, PA-C working with Derwood Kaplan, MD by Bethel Born, ED Scribe. This patient was seen in room WTR9/WTR9 and the patient's care was started at 6:23 PM.  Chief Complaint  Patient presents with  . Dental Pain  . Otalgia    HPI The history is provided by the patient. No language interpreter was used.   Cindy Bruce is a 24 y.o. female who presents to the Emergency Department complaining of constant left lower dental pain with onset 2 days ago. Ibuprofen has provided insufficient relief at home. She rates the pain 7/10 in severity and notes that it is exacerbated by chewing. Associated symptoms include left ear pain and headache. She called her dentist at the [facility] and has a scheduled appointment in 2 weeks. She was referred to the ED out of concern for an infection.   Past Medical History  Diagnosis Date  . Asthma   . Medical history non-contributory   . 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 Tooth extraction      Family History  Problem Relation Age of Onset  . Diabetes Mother   . Diabetes Maternal Grandmother     Social History  Substance Use Topics  . Smoking status: Current Every Day Smoker -- 1.00 packs/day    Types: Cigarettes  . Smokeless tobacco: Former Neurosurgeon  . Alcohol Use: No     Comment: occ     Review of Systems  Constitutional: Negative for fever and chills.  HENT: Positive for dental problem (left upper) and ear pain.   Gastrointestinal: Negative for nausea and vomiting.  Neurological: Positive for headaches. Negative for weakness.    Home Medications   Prior to Admission medications   Medication Sig Start Date End Date Taking? Authorizing Provider  albuterol (PROVENTIL HFA;VENTOLIN HFA) 108 (90 BASE) MCG/ACT inhaler  Inhale 2 puffs into the lungs every 4 (four) hours as needed for wheezing or shortness of breath. 08/12/15   Elson Areas, PA-C  amoxicillin (AMOXIL) 500 MG capsule Take 1 capsule (500 mg total) by mouth 3 (three) times daily. Patient not taking: Reported on 07/17/2015 06/02/15   Catha Gosselin, PA-C  azithromycin (ZITHROMAX Z-PAK) 250 MG tablet Two tablets 1st day then one tablet a day 08/12/15   Elson Areas, PA-C  HYDROcodone-acetaminophen (NORCO/VICODIN) 5-325 MG per tablet Take 2 tablets by mouth every 4 (four) hours as needed. Patient not taking: Reported on 07/17/2015 06/02/15   Catha Gosselin, PA-C  ibuprofen (ADVIL,MOTRIN) 200 MG tablet Take 200 mg by mouth every 6 (six) hours as needed for mild pain.     Historical Provider, MD  ibuprofen (ADVIL,MOTRIN) 400 MG tablet Take 1 tablet (400 mg total) by mouth every 6 (six) hours as needed. Patient not taking: Reported on 07/17/2015 07/03/15   Earley Favor, NP  methocarbamol (ROBAXIN) 500 MG tablet Take 1 tablet (500 mg total) by mouth 2 (two) times daily. 08/12/15   Elson Areas, PA-C  penicillin v potassium (VEETID) 500 MG tablet Take 1 tablet (500 mg total) by mouth 4 (four) times daily. 08/31/15 09/07/15  Joycie Peek, PA-C    Allergies  Food and Watermelon  Triage Vitals: BP 121/76 mmHg  Pulse 103  Temp(Src) 98.7 F (37.1 C) (Oral)  Resp 18  Wt 168 lb (76.204 kg)  SpO2 100%  LMP 08/22/2015  Physical Exam  Constitutional: She is oriented to person, place, and time. She appears well-developed and well-nourished.  Awake, alert, nontoxic appearance.  HENT:  Head: Normocephalic and atraumatic.  TMs appear to be clear bilaterally but there is moderate cerumen impaction Discomfort located to her left mandibular molars. Overall poor dentition. Multiple missing teeth with active caries. Mucous membranes are moist. No unilateral tonsillar swelling, uvula midline, no glossal swelling or elevation. No trismus. No fluctuance or evidence  of a drainable abscess. No other evidence of emergent infection, Retropharyngeal or Peritonsillar abscess, Ludwig or Vincents angina. Tolerating secretions well. Patent airway   Eyes: EOM are normal. Right eye exhibits no discharge. Left eye exhibits no discharge.  Neck: Normal range of motion. Neck supple.  Cardiovascular: Normal rate and regular rhythm.   No tachycardia on my exam, heart rate mid 80s  Pulmonary/Chest: Effort normal. She exhibits no tenderness.  Abdominal: Soft. She exhibits no distension. There is no tenderness. There is no rebound.  Musculoskeletal: Normal range of motion. She exhibits no tenderness.  Baseline ROM, no obvious new focal weakness.  Neurological: She is alert and oriented to person, place, and time.  Mental status and motor strength appears baseline for patient and situation.  Skin: No rash noted.  Psychiatric: She has a normal mood and affect.  Nursing note and vitals reviewed.   ED Course  Procedures  DIAGNOSTIC STUDIES: Oxygen Saturation is 100% on RA,  normal by my interpretation.    COORDINATION OF CARE: 6:26 PM Discussed treatment plan which includes abx with pt at bedside and pt agreed to the plan. Pt was offered a dental nerve block but refused.   Labs Review- Labs Reviewed - No data to display  Imaging Review No results found. Meds given in ED:  Medications - No data to display  Discharge Medication List as of 08/31/2015  6:38 PM    START taking these medications   Details  penicillin v potassium (VEETID) 500 MG tablet Take 1 tablet (500 mg total) by mouth 4 (four) times daily., Starting 08/31/2015, Until Sat 09/07/15, Print       Filed Vitals:   08/31/15 1759 08/31/15 1800  BP:  121/76  Pulse:  103  Temp:  98.7 F (37.1 C)  TempSrc:  Oral  Resp:  18  Weight: 168 lb (76.204 kg)   SpO2:  100%    MDM  Vitals stable , no tachycardia on my exam. -afebrile Pt resting comfortably in ED. PE--physical exam as above, no obvious  drainable abscess. Patient has overall poor dentition. However, no evidence of Ludwig or vincents angina, retropharyngeal or peritonsillar abscess. Refused oral nerve block. Will initiate antibiotics and encourage patient to follow-up with her dentist for definitive care. Continue ibuprofen at home for pain. I discussed all relevant lab findings and imaging results with pt and they verbalized understanding. Discussed f/u with PCP within 48 hrs and return precautions, pt very amenable to plan.  Final diagnoses:  Pain, dental    I personally performed the services described in this documentation, which was scribed in my presence. The recorded information has been reviewed and is accurate.     Joycie Peek, PA-C 08/31/15 2005  Derwood Kaplan, MD 09/01/15 [phone]

## 2015-08-31 NOTE — Discharge Instructions (Signed)
Please take your antibiotics as prescribed. Do not save or share them. Please follow-up with your dentist for definitive care. Return to ED for any worsening symptoms.  Dental Pain A tooth ache may be caused by cavities (tooth decay). Cavities expose the nerve of the tooth to air and hot or cold temperatures. It may come from an infection or abscess (also called a boil or furuncle) around your tooth. It is also often caused by dental caries (tooth decay). This causes the pain you are having. DIAGNOSIS  Your caregiver can diagnose this problem by exam. TREATMENT   If caused by an infection, it may be treated with medications which kill germs (antibiotics) and pain medications as prescribed by your caregiver. Take medications as directed.  Only take over-the-counter or prescription medicines for pain, discomfort, or fever as directed by your caregiver.  Whether the tooth ache today is caused by infection or dental disease, you should see your dentist as soon as possible for further care. SEEK MEDICAL CARE IF: The exam and treatment you received today has been provided on an emergency basis only. This is not a substitute for complete medical or dental care. If your problem worsens or new problems (symptoms) appear, and you are unable to meet with your dentist, call or return to this location. SEEK IMMEDIATE MEDICAL CARE IF:   You have a fever.  You develop redness and swelling of your face, jaw, or neck.  You are unable to open your mouth.  You have severe pain uncontrolled by pain medicine. MAKE SURE YOU:   Understand these instructions.  Will watch your condition.  Will get help right away if you are not doing well or get worse. Document Released: 11/23/2005 Document Revised: 02/15/2012 Document Reviewed: 07/11/2008 [facility] Patient Information 2015 [place], [place]. This information is not intended to replace advice given to you by your health care provider. Make sure you discuss any  questions you have with your health care provider.  Dental Care and Dentist Visits Dental care supports good overall health. Regular dental visits can also help you avoid dental pain, bleeding, infection, and other more serious health problems in the future. It is important to keep the mouth healthy because diseases in the teeth, gums, and other oral tissues can spread to other areas of the body. Some problems, such as diabetes, heart disease, and pre-term labor have been associated with poor oral health.  See your dentist every 6 months. If you experience emergency problems such as a toothache or broken tooth, go to the dentist right away. If you see your dentist regularly, you may catch problems early. It is easier to be treated for problems in the early stages.  WHAT TO EXPECT AT A DENTIST VISIT  Your dentist will look for many common oral health problems and recommend proper treatment. At your regular dental visit, you can expect:  Gentle cleaning of the teeth and gums. This includes scraping and polishing. This helps to remove the sticky substance around the teeth and gums (plaque). Plaque forms in the mouth shortly after eating. Over time, plaque hardens on the teeth as tartar. If tartar is not removed regularly, it can cause problems. Cleaning also helps remove stains.  Periodic X-rays. These pictures of the teeth and supporting bone will help your dentist assess the health of your teeth.  Periodic fluoride treatments. Fluoride is a natural mineral shown to help strengthen teeth. Fluoride treatmentinvolves applying a fluoride gel or varnish to the teeth. It is most commonly  done in children.  Examination of the mouth, tongue, jaws, teeth, and gums to look for any oral health problems, such as:  Cavities (dental caries). This is decay on the tooth caused by plaque, sugar, and acid in the mouth. It is best to catch a cavity when it is small.  Inflammation of the gums caused by plaque buildup  (gingivitis).  Problems with the mouth or malformed or misaligned teeth.  Oral cancer or other diseases of the soft tissues or jaws. KEEP YOUR TEETH AND GUMS HEALTHY For healthy teeth and gums, follow these general guidelines as well as your dentist's specific advice:  Have your teeth professionally cleaned at the dentist every 6 months.  Brush twice daily with a fluoride toothpaste.  Floss your teeth daily.  Ask your dentist if you need fluoride supplements, treatments, or fluoride toothpaste.  Eat a healthy diet. Reduce foods and drinks with added sugar.  Avoid smoking. TREATMENT FOR ORAL HEALTH PROBLEMS If you have oral health problems, treatment varies depending on the conditions present in your teeth and gums.  Your caregiver will most likely recommend good oral hygiene at each visit.  For cavities, gingivitis, or other oral health disease, your caregiver will perform a procedure to treat the problem. This is typically done at a separate appointment. Sometimes your caregiver will refer you to another dental specialist for specific tooth problems or for surgery. SEEK IMMEDIATE DENTAL CARE IF:  You have pain, bleeding, or soreness in the gum, tooth, jaw, or mouth area.  A permanent tooth becomes loose or separated from the gum socket.  You experience a blow or injury to the mouth or jaw area. Document Released: 08/05/2011 Document Revised: 02/15/2012 Document Reviewed: 08/05/2011 [facility] Patient Information 2015 [place], [place]. This information is not intended to replace advice given to you by your health care provider. Make sure you discuss any questions you have with your health care provider.

## 2015-08-31 NOTE — ED Notes (Signed)
t c/o left side dental pain and left ear pain x couple days.

## 2015-11-03 ENCOUNTER — Emergency Department (HOSPITAL_COMMUNITY)
Admission: EM | Admit: 2015-11-03 | Discharge: 2015-11-03 | Disposition: A | Discharge status: Home / Self Care | Payer: Medicaid Other | Attending: Emergency Medicine | Admitting: Emergency Medicine

## 2015-11-03 ENCOUNTER — Encounter (HOSPITAL_COMMUNITY): Payer: Self-pay | Admitting: Emergency Medicine

## 2015-11-03 DIAGNOSIS — Z79899 Other long term (current) drug therapy: Secondary | ICD-10-CM | POA: Insufficient documentation

## 2015-11-03 DIAGNOSIS — F17210 Nicotine dependence, cigarettes, uncomplicated: Secondary | ICD-10-CM | POA: Insufficient documentation

## 2015-11-03 DIAGNOSIS — H9202 Otalgia, left ear: Secondary | ICD-10-CM | POA: Insufficient documentation

## 2015-11-03 DIAGNOSIS — K0889 Other specified disorders of teeth and supporting structures: Secondary | ICD-10-CM | POA: Diagnosis present

## 2015-11-03 DIAGNOSIS — E119 Type 2 diabetes mellitus without complications: Secondary | ICD-10-CM | POA: Insufficient documentation

## 2015-11-03 DIAGNOSIS — J45909 Unspecified asthma, uncomplicated: Secondary | ICD-10-CM | POA: Diagnosis not present

## 2015-11-03 DIAGNOSIS — K029 Dental caries, unspecified: Secondary | ICD-10-CM | POA: Insufficient documentation

## 2015-11-03 MED ORDER — BUPIVACAINE-EPINEPHRINE (PF) 0.5% -1:200000 IJ SOLN
1.8000 mL | Freq: Once | INTRAMUSCULAR | Status: AC
Start: 2015-11-03 — End: 2015-11-03
  Administered 2015-11-03: 1.8 mL
  Filled 2015-11-03: qty 1.8

## 2015-11-03 MED ORDER — HYDROCODONE-ACETAMINOPHEN 5-325 MG PO TABS
1.0000 | ORAL_TABLET | ORAL | Status: DC | PRN
Start: 2015-11-03 — End: 2016-09-06

## 2015-11-03 MED ORDER — AMOXICILLIN 500 MG PO CAPS: 500.0000 mg | ORAL_CAPSULE | Freq: Three times a day (TID) | ORAL | Status: DC

## 2015-11-03 MED ORDER — HYDROCODONE-ACETAMINOPHEN 5-325 MG PO TABS
1.0000 | ORAL_TABLET | Freq: Once | ORAL | Status: AC
  Administered 2015-11-03: 1 via ORAL
  Filled 2015-11-03: qty 1

## 2015-11-03 NOTE — ED Notes (Signed)
PA at bedside.

## 2015-11-03 NOTE — Discharge Instructions (Signed)

## 2015-11-03 NOTE — ED Provider Notes (Signed)
CSN: [id number]     Arrival date & time 11/03/15  1318 History  By signing my name below, I, Cindy Bruce, attest that this documentation has been prepared under the direction and in the presence of non-physician practitioner, Cindy Matasiffany G Auset Fritzler, PA-C. Electronically Signed: Freida Busmaniana Bruce, Scribe. 11/03/2015. 2:14 PM.  Chief Complaint  Patient presents with  . Dental Pain   The history is provided by the patient. No language interpreter was used.    HPI Comments:  Cindy Bruce is a 24 y.o. female who presents to the Emergency Department complaining of 10/10 left lower dental pain that began yesterday. She reports radiation of pain to her left ear and h/o similar pain to the site. She has taken tylenol without relief.  Pt denies fever, nausea, vomiting, and diarrhea.  PMH of asthma and diabetes. Normal vitals, has had visits for dental pain in the past.  Past Medical History  Diagnosis Date  . Asthma   . Medical history non-contributory   . 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 Tooth extraction     Family History  Problem Relation Age of Onset  . Diabetes Mother   . Diabetes Maternal Grandmother    Social History  Substance Use Topics  . Smoking status: Current Every Day Smoker -- 1.00 packs/day    Types: Cigarettes  . Smokeless tobacco: Former NeurosurgeonUser  . Alcohol Use: No     Comment: occ   OB History    Gravida Para Term Preterm AB TAB SAB Ectopic Multiple Living   3 3 3  0 0 0 0 0 0 3     Review of Systems  Constitutional: Negative for fever and chills.  HENT: Positive for dental problem and ear pain.   Respiratory: Negative for shortness of breath.   Cardiovascular: Negative for chest pain.   Allergies  Food and Watermelon  Home Medications   Prior to Admission medications   Medication Sig Start Date End Date Taking? Authorizing Provider  acetaminophen (TYLENOL) 500 MG tablet Take 500  mg by mouth once.   Yes Historical Provider, MD  albuterol (PROVENTIL HFA;VENTOLIN HFA) 108 (90 BASE) MCG/ACT inhaler Inhale 2 puffs into the lungs every 4 (four) hours as needed for wheezing or shortness of breath. 08/12/15  Yes Cindy SkinnerLeslie K Sofia, PA-C  amoxicillin (AMOXIL) 500 MG capsule Take 1 capsule (500 mg total) by mouth 3 (three) times daily. Patient not taking: Reported on 07/17/2015 06/02/15   Catha GosselinHanna Patel-Mills, PA-C  amoxicillin (AMOXIL) 500 MG capsule Take 1 capsule (500 mg total) by mouth 3 (three) times daily. 11/03/15   Cindy Peliffany Naeemah Jasmer, PA-C  azithromycin (ZITHROMAX Z-PAK) 250 MG tablet Two tablets 1st day then one tablet a day Patient not taking: Reported on 11/03/2015 08/12/15   Cindy AreasLeslie K Sofia, PA-C  HYDROcodone-acetaminophen (NORCO/VICODIN) 5-325 MG per tablet Take 2 tablets by mouth every 4 (four) hours as needed. Patient not taking: Reported on 07/17/2015 06/02/15   Catha GosselinHanna Patel-Mills, PA-C  HYDROcodone-acetaminophen (NORCO/VICODIN) 5-325 MG tablet Take 1-2 tablets by mouth every 4 (four) hours as needed. 11/03/15   Cindy Garber Neva SeatGreene, PA-C  ibuprofen (ADVIL,MOTRIN) 400 MG tablet Take 1 tablet (400 mg total) by mouth every 6 (six) hours as needed. Patient not taking: Reported on 07/17/2015 07/03/15   Earley FavorGail Schulz, NP  methocarbamol (ROBAXIN) 500 MG tablet Take 1 tablet (500 mg total) by mouth 2 (two) times daily.  Patient not taking: Reported on 11/03/2015 08/12/15   Cindy Skinner Sofia, PA-C   BP 148/95 mmHg  Pulse 105  Temp(Src) 98 F (36.7 C) (Oral)  Resp 16  SpO2 98% Physical Exam  Constitutional: She is oriented to person, place, and time. She appears well-developed and well-nourished. No distress.  HENT:  Head: Normocephalic and atraumatic.  Mouth/Throat: Uvula is midline, oropharynx is clear and moist and mucous membranes are normal. Normal dentition. Dental caries (Pts tooth shows no obvious abscess but moderate to severe tenderness to palpation of marked tooth) present. No uvula swelling.     Wide spread dental decay  Eyes: Conjunctivae are normal. Pupils are equal, round, and reactive to light.  Neck: Trachea normal, normal range of motion and full passive range of motion without pain. Neck supple.  Cardiovascular: Normal rate, regular rhythm, normal heart sounds and normal pulses.   Pulmonary/Chest: Effort normal and breath sounds normal. No respiratory distress. Chest wall is not dull to percussion. She exhibits no tenderness, no crepitus, no edema, no deformity and no retraction.  Abdominal: Normal appearance. She exhibits no distension.  Musculoskeletal: Normal range of motion.  Neurological: She is alert and oriented to person, place, and time. She has normal strength.  Skin: Skin is warm, dry and intact. She is not diaphoretic.  Psychiatric: She has a normal mood and affect. Her speech is normal. Cognition and memory are normal.  Nursing note and vitals reviewed.   ED Course  Procedures   DIAGNOSTIC STUDIES:  Oxygen Saturation is 98% on RA, normal by my interpretation.    COORDINATION OF CARE:  1:53 PM Will perform dental block. Discussed treatment plan with pt at bedside and pt agreed to plan.     MDM   Final diagnoses:  Toothache    DENTAL NERVE BLOCK Date/Time: 11/03/2015, 2:00pm Performed by: Cindy Matas Authorized by: Cindy Matas Consent: Verbal consent obtained. Risks and benefits: risks, benefits and alternatives were discussed Consent given by: patient Indications: pain relief Body area: face/mouth Laterality: left lower Needle gauge: 25 G Local anesthetic: lidocaine 2% without epinephrine Anesthetic total: 2 ml Outcome: pain improved Patient tolerance: Patient tolerated the procedure well with no immediate complications. Comments: Patient had complete relief of pain.  Medications  bupivacaine-epinephrine (MARCAINE W/ EPI) 0.5% -1:200000 injection 1.8 mL (1.8 mLs Infiltration Given by Other 11/03/15 1407)    24  y.o.[name]'s evaluation in the Emergency Department is complete.  We have discussed signs and symptoms that warrant return to the ED, such as changes or worsening in symptoms. No emergent s/sx's present. Patent airway. No trismus.  No neck tenderness or protrusion of tongue or floor of mouth. Patient will be given an rx for Vicodin #10 tabs and Amoxicillin. He will be referred to a dentist with instructions for follow-up.  Vital signs are stable at discharge. Filed Vitals:   11/03/15 1324  BP: 148/95  Pulse: 105  Temp: 98 F (36.7 C)  Resp: 16    Patient/guardian has voiced understanding and agreed to follow-up with the PCP or specialist.      Cindy Pel, PA-C 11/03/15 1415  Laurence Spates, MD 11/11/15 [phone]

## 2015-11-03 NOTE — ED Notes (Signed)
Pt c/o toothache since yesterday.  Pt crying in triage.

## 2016-06-03 ENCOUNTER — Encounter (HOSPITAL_COMMUNITY): Payer: Self-pay

## 2016-06-03 ENCOUNTER — Emergency Department (HOSPITAL_COMMUNITY)
Admission: EM | Admit: 2016-06-03 | Discharge: 2016-06-03 | Disposition: A | Discharge status: Home / Self Care | Payer: Medicaid Other | Attending: Emergency Medicine | Admitting: Emergency Medicine

## 2016-06-03 DIAGNOSIS — O99331 Smoking (tobacco) complicating pregnancy, first trimester: Secondary | ICD-10-CM | POA: Insufficient documentation

## 2016-06-03 DIAGNOSIS — J45909 Unspecified asthma, uncomplicated: Secondary | ICD-10-CM | POA: Insufficient documentation

## 2016-06-03 DIAGNOSIS — E119 Type 2 diabetes mellitus without complications: Secondary | ICD-10-CM | POA: Insufficient documentation

## 2016-06-03 DIAGNOSIS — Z3A01 Less than 8 weeks gestation of pregnancy: Secondary | ICD-10-CM | POA: Insufficient documentation

## 2016-06-03 DIAGNOSIS — F17210 Nicotine dependence, cigarettes, uncomplicated: Secondary | ICD-10-CM | POA: Insufficient documentation

## 2016-06-03 DIAGNOSIS — Z3490 Encounter for supervision of normal pregnancy, unspecified, unspecified trimester: Secondary | ICD-10-CM

## 2016-06-03 DIAGNOSIS — Z3201 Encounter for pregnancy test, result positive: Secondary | ICD-10-CM | POA: Insufficient documentation

## 2016-06-03 DIAGNOSIS — Z79899 Other long term (current) drug therapy: Secondary | ICD-10-CM | POA: Insufficient documentation

## 2016-06-03 LAB — URINE MICROSCOPIC-ADD ON: RBC / HPF: NONE SEEN RBC/hpf (ref 0–5)

## 2016-06-03 LAB — URINALYSIS, ROUTINE W REFLEX MICROSCOPIC
Bilirubin Urine: NEGATIVE
HGB URINE DIPSTICK: NEGATIVE
Ketones, ur: 15 mg/dL — AB
LEUKOCYTES UA: NEGATIVE
Nitrite: NEGATIVE
PH: 5.5 (ref 5.0–8.0)
Protein, ur: NEGATIVE mg/dL
Specific Gravity, Urine: 1.046 — ABNORMAL HIGH (ref 1.005–1.030)

## 2016-06-03 LAB — I-STAT BETA HCG BLOOD, ED (MC, WL, AP ONLY)

## 2016-06-03 LAB — POC URINE PREG, ED: Preg Test, Ur: POSITIVE — AB

## 2016-06-03 NOTE — Discharge Instructions (Signed)
You have been seen today for a pregnancy check. Your pregnancy test was positive. Based on your last menstrual period, you are due January 25, 2017, which means you are 6 weeks and 2 days. There is a lot of variability with using this method. [facility] to get a confirmation ultrasound.

## 2016-06-03 NOTE — ED Provider Notes (Signed)
History  By signing my name below, I, Cindy Bruce, attest that this documentation has been prepared under the direction and in the presence of Cindy Meiring, PA-C. Electronically Signed: Earmon PhoenixJennifer Bruce, ED Scribe. 06/03/2016. 3:28 PM.  Chief Complaint  Patient presents with  . Possible Pregnancy   The history is provided by the patient and medical records. No language interpreter was used.    HPI Comments:  Cindy Bruce is a 25 y.o. female who presents to the Emergency Department wanting a pregnancy test. She states she took a home pregnancy test yesterday and wants confirmation. She reports some mild, intermittent abdominal cramping that she describes as period-type cramps, but denies any recent instances of this pain. She denies vaginal discharge or bleeding, fever/chills, nausea/vomiting, or any other complaints. She reports this is her 4th pregnancy and the first three were normal and carried to term. LMP 04/20/16.  Past Medical History  Diagnosis Date  . Asthma   . Medical history non-contributory   . 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 Tooth extraction     Family History  Problem Relation Age of Onset  . Diabetes Mother   . Diabetes Maternal Grandmother    Social History  Substance Use Topics  . Smoking status: Current Every Day Smoker -- 1.00 packs/day    Types: Cigarettes  . Smokeless tobacco: Former NeurosurgeonUser  . Alcohol Use: No     Comment: occ   OB History    Gravida Para Term Preterm AB TAB SAB Ectopic Multiple Living   3 3 3  0 0 0 0 0 0 3     Review of Systems  Constitutional: Negative for fever and chills.  Gastrointestinal: Negative for nausea, vomiting and abdominal pain (none currently).  Genitourinary: Negative for vaginal bleeding and vaginal discharge.       Positive pregnancy test    Allergies  Food and Watermelon  Home Medications   Prior to Admission medications    Medication Sig Start Date End Date Taking? Authorizing Provider  albuterol (PROVENTIL HFA;VENTOLIN HFA) 108 (90 BASE) MCG/ACT inhaler Inhale 2 puffs into the lungs every 4 (four) hours as needed for wheezing or shortness of breath. Patient not taking: Reported on 06/03/2016 08/12/15   Cindy AreasLeslie K Sofia, PA-C  amoxicillin (AMOXIL) 500 MG capsule Take 1 capsule (500 mg total) by mouth 3 (three) times daily. Patient not taking: Reported on 07/17/2015 06/02/15   Catha GosselinHanna Patel-Mills, PA-C  amoxicillin (AMOXIL) 500 MG capsule Take 1 capsule (500 mg total) by mouth 3 (three) times daily. Patient not taking: Reported on 06/03/2016 11/03/15   Cindy Peliffany Greene, PA-C  azithromycin (ZITHROMAX Z-PAK) 250 MG tablet Two tablets 1st day then one tablet a day Patient not taking: Reported on 11/03/2015 08/12/15   Cindy AreasLeslie K Sofia, PA-C  HYDROcodone-acetaminophen (NORCO/VICODIN) 5-325 MG per tablet Take 2 tablets by mouth every 4 (four) hours as needed. Patient not taking: Reported on 07/17/2015 06/02/15   Catha GosselinHanna Patel-Mills, PA-C  HYDROcodone-acetaminophen (NORCO/VICODIN) 5-325 MG tablet Take 1-2 tablets by mouth every 4 (four) hours as needed. Patient not taking: Reported on 06/03/2016 11/03/15   Cindy Peliffany Greene, PA-C  ibuprofen (ADVIL,MOTRIN) 400 MG tablet Take 1 tablet (400 mg total) by mouth every 6 (six) hours as needed. Patient not taking: Reported on 07/17/2015 07/03/15   Cindy FavorGail Schulz, NP  methocarbamol (ROBAXIN) 500 MG tablet Take 1 tablet (500 mg total) by  mouth 2 (two) times daily. Patient not taking: Reported on 11/03/2015 08/12/15   Cindy AreasLeslie K Sofia, PA-C   Triage Vitals: BP 125/73 mmHg  Pulse 107  Temp(Src) 99.3 F (37.4 C) (Oral)  Resp 18  SpO2 100%  LMP 04/20/2016 Physical Exam  Constitutional: She appears well-developed and well-nourished. No distress.  HENT:  Head: Normocephalic and atraumatic.  Eyes: Conjunctivae are normal.  Neck: Neck supple.  Cardiovascular: Normal rate and regular rhythm.    Pulmonary/Chest: Effort normal.  Abdominal: Soft. There is no tenderness.  Neurological: She is alert.  Skin: Skin is warm and dry. She is not diaphoretic.  Psychiatric: She has a normal mood and affect. Her behavior is normal.  Nursing note and vitals reviewed.   ED Course  Procedures (including critical care time) DIAGNOSTIC STUDIES: Oxygen Saturation is 100% on RA, normal by my interpretation.   COORDINATION OF CARE: 3:13 PM- Will order I-Stat quant. Return precautions discussed. Pt verbalizes understanding and agrees to plan.  Medications - No data to display  Labs Review Labs Reviewed  URINALYSIS, ROUTINE W REFLEX MICROSCOPIC (NOT AT [facility]) - Abnormal; Notable for the following:    APPearance CLOUDY (*)    Specific Gravity, Urine 1.046 (*)    Glucose, UA >1000 (*)    Ketones, ur 15 (*)    All other components within normal limits  URINE MICROSCOPIC-ADD ON - Abnormal; Notable for the following:    Squamous Epithelial / LPF 6-30 (*)    Bacteria, UA MANY (*)    All other components within normal limits  POC URINE PREG, ED - Abnormal; Notable for the following:    Preg Test, Ur POSITIVE (*)    All other components within normal limits  I-STAT BETA HCG BLOOD, ED ([facility], [facility], [facility] ONLY) - Abnormal; Notable for the following:    I-stat hCG, quantitative >2000.0 (*)    All other components within normal limits    MDM   Final diagnoses:  Pregnancy    Cindy Bruce presents for pregnancy confirmation.  The patient has a positive pregnancy test and an hCG consistent with a pregnancy of this term. Estimated gestation is about 6 weeks. Patient advised to follow-up with OB/GYN. Patient states [facility] and will make an appointment there as soon as possible. Patient has no red flag symptoms and no symptoms of ectopic pregnancy. Return precautions discussed. Patient voiced understanding of all instructions and is comfortable with  discharge.  Filed Vitals:   06/03/16 1409 06/03/16 1541  BP: 125/73 122/61  Pulse: 107 92  Temp: 99.3 F (37.4 C) 99 F (37.2 C)  TempSrc: Oral Oral  Resp: 18 18  SpO2: 100% 100%     I personally performed the services described in this documentation, which was scribed in my presence. The recorded information has been reviewed and is accurate.     Anselm PancoastShawn C Talena Neira, PA-C 06/04/16 [id number]  Linwood DibblesJon Knapp, MD 06/04/16 [phone]

## 2016-06-03 NOTE — ED Notes (Signed)
Called pt for room in wait/subwait with no answer

## 2016-06-03 NOTE — ED Notes (Addendum)
Patient here requesting verification of positive pregnancy. Missed her menstrual cycle the middle of this month. No complaints, no pain

## 2016-06-30 ENCOUNTER — Ambulatory Visit (INDEPENDENT_AMBULATORY_CARE_PROVIDER_SITE_OTHER): Payer: Self-pay | Admitting: *Deleted

## 2016-06-30 ENCOUNTER — Encounter: Payer: Self-pay | Admitting: Family Medicine

## 2016-06-30 DIAGNOSIS — Z3200 Encounter for pregnancy test, result unknown: Secondary | ICD-10-CM

## 2016-06-30 DIAGNOSIS — Z3201 Encounter for pregnancy test, result positive: Secondary | ICD-10-CM

## 2016-06-30 LAB — POCT PREGNANCY, URINE: PREG TEST UR: POSITIVE — AB

## 2016-06-30 NOTE — Progress Notes (Addendum)
Pt reports unsure LMP of 04/20/16. She requests prenatal care from this office. She has Type II diabetes and has received care in this office with last 2 pregnancies.

## 2016-07-24 ENCOUNTER — Encounter: Payer: Self-pay | Admitting: Family Medicine

## 2016-08-19 ENCOUNTER — Encounter: Payer: Self-pay | Admitting: Obstetrics and Gynecology

## 2016-08-19 ENCOUNTER — Ambulatory Visit (INDEPENDENT_AMBULATORY_CARE_PROVIDER_SITE_OTHER): Payer: Medicaid Other | Admitting: Obstetrics and Gynecology

## 2016-08-19 VITALS — BP 118/73 | HR 111 | Temp 98.7°F | Wt 163.7 lb

## 2016-08-19 DIAGNOSIS — O0990 Supervision of high risk pregnancy, unspecified, unspecified trimester: Secondary | ICD-10-CM | POA: Insufficient documentation

## 2016-08-19 DIAGNOSIS — O360120 Maternal care for anti-D [Rh] antibodies, second trimester, not applicable or unspecified: Secondary | ICD-10-CM

## 2016-08-19 DIAGNOSIS — O09892 Supervision of other high risk pregnancies, second trimester: Secondary | ICD-10-CM | POA: Diagnosis not present

## 2016-08-19 DIAGNOSIS — O24912 Unspecified diabetes mellitus in pregnancy, second trimester: Secondary | ICD-10-CM | POA: Diagnosis not present

## 2016-08-19 DIAGNOSIS — O0992 Supervision of high risk pregnancy, unspecified, second trimester: Secondary | ICD-10-CM | POA: Diagnosis not present

## 2016-08-19 MED ORDER — METFORMIN HCL 1000 MG PO TABS: 1000.0000 mg | ORAL_TABLET | Freq: Two times a day (BID) | ORAL | 3 refills | Status: DC

## 2016-08-19 MED ORDER — ACCU-CHEK NANO SMARTVIEW W/DEVICE KIT: 1.0000 | PACK | Freq: Four times a day (QID) | 0 refills | Status: DC

## 2016-08-19 MED ORDER — ACCU-CHEK FASTCLIX LANCETS MISC: 1.0000 [IU] | Freq: Four times a day (QID) | 3 refills | Status: DC

## 2016-08-19 MED ORDER — ASPIRIN EC 81 MG PO TBEC: 81.0000 mg | DELAYED_RELEASE_TABLET | Freq: Every day | ORAL | 6 refills | Status: DC

## 2016-08-19 MED ORDER — GLUCOSE BLOOD VI STRP: ORAL_STRIP | 12 refills | Status: DC

## 2016-08-19 NOTE — Progress Notes (Signed)
Subjective:    Cindy NewportZenda T Effinger is a [id number] at 3875w2d being seen today for her first obstetrical visit.  Her obstetrical history is significant for type 2 DM. Patient required insulin for all 3 pregnancies. She self discontinued metformin a year ago and states that her DM was well controlled on diet. Patient does intend to breast feed. Pregnancy history fully reviewed.  Patient reports no complaints.  Vitals:   08/19/16 1440  BP: 118/73  Pulse: (!) 111  Temp: 98.7 F (37.1 C)  Weight: 163 lb 11.2 oz (74.3 kg)    HISTORY: OB History  Gravida Para Term Preterm AB Living  4 3 3  0 0 3  SAB TAB Ectopic Multiple Live Births  0 0 0 0 3    # Outcome Date GA Lbr Len/2nd Weight Sex Delivery Anes PTL Lv  4 Current           3 Term 02/26/14 1557w3d 08:51 / 00:40 6 lb 12.2 oz (3.067 kg) M Vag-Spont EPI  LIV  2 Term 01/06/13 174w2d 12:38 / 00:21 5 lb 7.6 oz (2.483 kg) M Vag-Spont EPI  LIV     Birth Comments: Mother denies complication during delivery  1 Term 10/16/07 4530w0d  6 lb 7 oz (2.92 kg) F Vag-Spont EPI N LIV     Past Medical History:  Diagnosis Date  . Asthma   . Diabetes mellitus    Type 2 since 1st pg in 2008 - on insulin  . Medical history non-contributory    Past Surgical History:  Procedure Laterality Date  . TOOTH EXTRACTION     Family History  Problem Relation Age of Onset  . Diabetes Mother   . Diabetes Maternal Grandmother      Exam    Uterus:   18-weel size uterus  Pelvic Exam:    Perineum: Normal Perineum   Vulva: normal   Vagina:  normal mucosa, normal discharge   pH:    Cervix: multiparous appearance and cervix is closed and long   Adnexa: no mass, fullness, tenderness   Bony Pelvis: gynecoid  System: Breast:  normal appearance, no masses or tenderness   Skin: normal coloration and turgor, no rashes    Neurologic: oriented, no focal deficits   Extremities: normal strength, tone, and muscle mass   HEENT extra ocular movement intact   Mouth/Teeth  mucous membranes moist, pharynx normal without lesions and dental hygiene good   Neck supple and no masses   Cardiovascular: regular rate and rhythm   Respiratory:  chest clear, no wheezing, crepitations, rhonchi, normal symmetric air entry   Abdomen: soft, non-tender; bowel sounds normal; no masses,  no organomegaly   Urinary:       Assessment:    Pregnancy: [id number] Patient Active Problem List   Diagnosis Date Noted  . Diabetes mellitus complicating pregnancy, antepartum 09/11/2013  . Rh negative state in antepartum period 08/24/2013  . Asthma 07/11/2012        Plan:     Initial labs drawn. Prenatal vitamins. Problem list reviewed and updated. Genetic Screening discussed Quad Screen: ordered.  Ultrasound discussed; fetal survey: ordered. Patient to start CBG testing and following diabetic diet. Meter and supplies prescribed Stressed the importance of keeping with the diet and taking medications regularly as to avoid complication during prenatal care Baseline labs ordered Referral to diabetic coordinator also placed  Follow up in 4 weeks. 50% of 30 min visit spent on counseling and coordination of care.     Jidenna Figgs 08/19/2016

## 2016-08-19 NOTE — Patient Instructions (Signed)
Contraception Choices Contraception (birth control) is the use of any methods or devices to prevent pregnancy. Below are some methods to help avoid pregnancy. HORMONAL METHODS   Contraceptive implant. This is a thin, plastic tube containing progesterone hormone. It does not contain estrogen hormone. Your health care provider inserts the tube in the inner part of the upper arm. The tube can remain in place for up to 3 years. After 3 years, the implant must be removed. The implant prevents the ovaries from releasing an egg (ovulation), thickens the cervical mucus to prevent sperm from entering the uterus, and thins the lining of the inside of the uterus.  Progesterone-only injections. These injections are given every 3 months by your health care provider to prevent pregnancy. This synthetic progesterone hormone stops the ovaries from releasing eggs. It also thickens cervical mucus and changes the uterine lining. This makes it harder for sperm to survive in the uterus.  Birth control pills. These pills contain estrogen and progesterone hormone. They work by preventing the ovaries from releasing eggs (ovulation). They also cause the cervical mucus to thicken, preventing the sperm from entering the uterus. Birth control pills are prescribed by a health care provider.Birth control pills can also be used to treat heavy periods.  Minipill. This type of birth control pill contains only the progesterone hormone. They are taken every day of each month and must be prescribed by your health care provider.  Birth control patch. The patch contains hormones similar to those in birth control pills. It must be changed once a week and is prescribed by a health care provider.  Vaginal ring. The ring contains hormones similar to those in birth control pills. It is left in the vagina for 3 weeks, removed for 1 week, and then a new one is put back in place. The patient must be comfortable inserting and removing the ring  from the vagina.A health care provider's prescription is necessary.  Emergency contraception. Emergency contraceptives prevent pregnancy after unprotected sexual intercourse. This pill can be taken right after sex or up to 5 days after unprotected sex. It is most effective the sooner you take the pills after having sexual intercourse. Most emergency contraceptive pills are available without a prescription. Check with your pharmacist. Do not use emergency contraception as your only form of birth control. BARRIER METHODS   Female condom. This is a thin sheath (latex or rubber) that is worn over the penis during sexual intercourse. It can be used with spermicide to increase effectiveness.  Female condom. This is a soft, loose-fitting sheath that is put into the vagina before sexual intercourse.  Diaphragm. This is a soft, latex, dome-shaped barrier that must be fitted by a health care provider. It is inserted into the vagina, along with a spermicidal jelly. It is inserted before intercourse. The diaphragm should be left in the vagina for 6 to 8 hours after intercourse.  Cervical cap. This is a round, soft, latex or plastic cup that fits over the cervix and must be fitted by a health care provider. The cap can be left in place for up to 48 hours after intercourse.  Sponge. This is a soft, circular piece of polyurethane foam. The sponge has spermicide in it. It is inserted into the vagina after wetting it and before sexual intercourse.  Spermicides. These are chemicals that kill or block sperm from entering the cervix and uterus. They come in the form of creams, jellies, suppositories, foam, or tablets. They do not require a   prescription. They are inserted into the vagina with an applicator before having sexual intercourse. The process must be repeated every time you have sexual intercourse. INTRAUTERINE CONTRACEPTION  Intrauterine device (IUD). This is a T-shaped device that is put in a woman's uterus  during a menstrual period to prevent pregnancy. There are 2 types:  Copper IUD. This type of IUD is wrapped in copper wire and is placed inside the uterus. Copper makes the uterus and fallopian tubes produce a fluid that kills sperm. It can stay in place for 10 years.  Hormone IUD. This type of IUD contains the hormone progestin (synthetic progesterone). The hormone thickens the cervical mucus and prevents sperm from entering the uterus, and it also thins the uterine lining to prevent implantation of a fertilized egg. The hormone can weaken or kill the sperm that get into the uterus. It can stay in place for 3-5 years, depending on which type of IUD is used. PERMANENT METHODS OF CONTRACEPTION  Female tubal ligation. This is when the woman's fallopian tubes are surgically sealed, tied, or blocked to prevent the egg from traveling to the uterus.  Hysteroscopic sterilization. This involves placing a small coil or insert into each fallopian tube. Your doctor uses a technique called hysteroscopy to do the procedure. The device causes scar tissue to form. This results in permanent blockage of the fallopian tubes, so the sperm cannot fertilize the egg. It takes about 3 months after the procedure for the tubes to become blocked. You must use another form of birth control for these 3 months.  Female sterilization. This is when the female has the tubes that carry sperm tied off (vasectomy).This blocks sperm from entering the vagina during sexual intercourse. After the procedure, the man can still ejaculate fluid (semen). NATURAL PLANNING METHODS  Natural family planning. This is not having sexual intercourse or using a barrier method (condom, diaphragm, cervical cap) on days the woman could become pregnant.  Calendar method. This is keeping track of the length of each menstrual cycle and identifying when you are fertile.  Ovulation method. This is avoiding sexual intercourse during ovulation.  Symptothermal  method. This is avoiding sexual intercourse during ovulation, using a thermometer and ovulation symptoms.  Post-ovulation method. This is timing sexual intercourse after you have ovulated. Regardless of which type or method of contraception you choose, it is important that you use condoms to protect against the transmission of sexually transmitted infections (STIs). Talk with your health care provider about which form of contraception is most appropriate for you.   This information is not intended to replace advice given to you by your health care provider. Make sure you discuss any questions you have with your health care provider.   Document Released: 11/23/2005 Document Revised: 11/28/2013 Document Reviewed: 05/18/2013 Elsevier Interactive Patient Education 2016 [employer]  Breastfeeding Deciding to breastfeed is one of the best choices you can make for you and your baby. A change in hormones during pregnancy causes your breast tissue to grow and increases the number and size of your milk ducts. These hormones also allow proteins, sugars, and fats from your blood supply to make breast milk in your milk-producing glands. Hormones prevent breast milk from being released before your baby is born as well as prompt milk flow after birth. Once breastfeeding has begun, thoughts of your baby, as well as his or her sucking or crying, can stimulate the release of milk from your milk-producing glands.  BENEFITS OF BREASTFEEDING For Your Baby    Your first milk (colostrum) helps your baby's digestive system function better.  There are antibodies in your milk that help your baby fight off infections.  Your baby has a lower incidence of asthma, allergies, and sudden infant death syndrome.  The nutrients in breast milk are better for your baby than infant formulas and are designed uniquely for your baby's needs.  Breast milk improves your baby's brain development.  Your baby is less likely to develop  other conditions, such as childhood obesity, asthma, or type 2 diabetes mellitus. For You  Breastfeeding helps to create a very special bond between you and your baby.  Breastfeeding is convenient. Breast milk is always available at the correct temperature and costs nothing.  Breastfeeding helps to burn calories and helps you lose the weight gained during pregnancy.  Breastfeeding makes your uterus contract to its prepregnancy size faster and slows bleeding (lochia) after you give birth.   Breastfeeding helps to lower your risk of developing type 2 diabetes mellitus, osteoporosis, and breast or ovarian cancer later in life. SIGNS THAT YOUR BABY IS HUNGRY Early Signs of Hunger  Increased alertness or activity.  Stretching.  Movement of the head from side to side.  Movement of the head and opening of the mouth when the corner of the mouth or cheek is stroked (rooting).  Increased sucking sounds, smacking lips, cooing, sighing, or squeaking.  Hand-to-mouth movements.  Increased sucking of fingers or hands. Late Signs of Hunger  Fussing.  Intermittent crying. Extreme Signs of Hunger Signs of extreme hunger will require calming and consoling before your baby will be able to breastfeed successfully. Do not wait for the following signs of extreme hunger to occur before you initiate breastfeeding:  Restlessness.  A loud, strong cry.  Screaming. BREASTFEEDING BASICS Breastfeeding Initiation  Find a comfortable place to sit or lie down, with your neck and back well supported.  Place a pillow or rolled up blanket under your baby to bring him or her to the level of your breast (if you are seated). Nursing pillows are specially designed to help support your arms and your baby while you breastfeed.  Make sure that your baby's abdomen is facing your abdomen.  Gently massage your breast. With your fingertips, massage from your chest wall toward your nipple in a circular motion.  This encourages milk flow. You may need to continue this action during the feeding if your milk flows slowly.  Support your breast with 4 fingers underneath and your thumb above your nipple. Make sure your fingers are well away from your nipple and your baby's mouth.  Stroke your baby's lips gently with your finger or nipple.  When your baby's mouth is open wide enough, quickly bring your baby to your breast, placing your entire nipple and as much of the colored area around your nipple (areola) as possible into your baby's mouth.  More areola should be visible above your baby's upper lip than below the lower lip.  Your baby's tongue should be between his or her lower gum and your breast.  Ensure that your baby's mouth is correctly positioned around your nipple (latched). Your baby's lips should create a seal on your breast and be turned out (everted).  It is common for your baby to suck about 2-3 minutes in order to start the flow of breast milk. Latching Teaching your baby how to latch on to your breast properly is very important. An improper latch can cause nipple pain and decreased milk supply for   you and poor weight gain in your baby. Also, if your baby is not latched onto your nipple properly, he or she may swallow some air during feeding. This can make your baby fussy. Burping your baby when you switch breasts during the feeding can help to get rid of the air. However, teaching your baby to latch on properly is still the best way to prevent fussiness from swallowing air while breastfeeding. Signs that your baby has successfully latched on to your nipple:  Silent tugging or silent sucking, without causing you pain.  Swallowing heard between every 3-4 sucks.  Muscle movement above and in front of his or her ears while sucking. Signs that your baby has not successfully latched on to nipple:  Sucking sounds or smacking sounds from your baby while breastfeeding.  Nipple pain. If you  think your baby has not latched on correctly, slip your finger into the corner of your baby's mouth to break the suction and place it between your baby's gums. Attempt breastfeeding initiation again. Signs of Successful Breastfeeding Signs from your baby:  A gradual decrease in the number of sucks or complete cessation of sucking.  Falling asleep.  Relaxation of his or her body.  Retention of a small amount of milk in his or her mouth.  Letting go of your breast by himself or herself. Signs from you:  Breasts that have increased in firmness, weight, and size 1-3 hours after feeding.  Breasts that are softer immediately after breastfeeding.  Increased milk volume, as well as a change in milk consistency and color by the fifth day of breastfeeding.  Nipples that are not sore, cracked, or bleeding. Signs That Your Baby is Getting Enough Milk  Wetting at least 3 diapers in a 24-hour period. The urine should be clear and pale yellow by age 5 days.  At least 3 stools in a 24-hour period by age 5 days. The stool should be soft and yellow.  At least 3 stools in a 24-hour period by age 7 days. The stool should be seedy and yellow.  No loss of weight greater than 10% of birth weight during the first 3 days of age.  Average weight gain of 4-7 ounces (113-198 g) per week after age 4 days.  Consistent daily weight gain by age 5 days, without weight loss after the age of 2 weeks. After a feeding, your baby may spit up a small amount. This is common. BREASTFEEDING FREQUENCY AND DURATION Frequent feeding will help you make more milk and can prevent sore nipples and breast engorgement. Breastfeed when you feel the need to reduce the fullness of your breasts or when your baby shows signs of hunger. This is called "breastfeeding on demand." Avoid introducing a pacifier to your baby while you are working to establish breastfeeding (the first 4-6 weeks after your baby is born). After this time you  may choose to use a pacifier. Research has shown that pacifier use during the first year of a baby's life decreases the risk of sudden infant death syndrome (SIDS). Allow your baby to feed on each breast as long as he or she wants. Breastfeed until your baby is finished feeding. When your baby unlatches or falls asleep while feeding from the first breast, offer the second breast. Because newborns are often sleepy in the first few weeks of life, you may need to awaken your baby to get him or her to feed. Breastfeeding times will vary from baby to baby. However, the following   rules can serve as a guide to help you ensure that your baby is properly fed:  Newborns (babies 4 weeks of age or younger) may breastfeed every 1-3 hours.  Newborns should not go longer than 3 hours during the day or 5 hours during the night without breastfeeding.  You should breastfeed your baby a minimum of 8 times in a 24-hour period until you begin to introduce solid foods to your baby at around 6 months of age. BREAST MILK PUMPING Pumping and storing breast milk allows you to ensure that your baby is exclusively fed your breast milk, even at times when you are unable to breastfeed. This is especially important if you are going back to work while you are still breastfeeding or when you are not able to be present during feedings. Your lactation consultant can give you guidelines on how long it is safe to store breast milk. A breast pump is a machine that allows you to pump milk from your breast into a sterile bottle. The pumped breast milk can then be stored in a refrigerator or freezer. Some breast pumps are operated by hand, while others use electricity. Ask your lactation consultant which type will work best for you. Breast pumps can be purchased, but some hospitals and breastfeeding support groups lease breast pumps on a monthly basis. A lactation consultant can teach you how to hand express breast milk, if you prefer not to use  a pump. CARING FOR YOUR BREASTS WHILE YOU BREASTFEED Nipples can become dry, cracked, and sore while breastfeeding. The following recommendations can help keep your breasts moisturized and healthy:  Avoid using soap on your nipples.  Wear a supportive bra. Although not required, special nursing bras and tank tops are designed to allow access to your breasts for breastfeeding without taking off your entire bra or top. Avoid wearing underwire-style bras or extremely tight bras.  Air dry your nipples for 3-4minutes after each feeding.  Use only cotton bra pads to absorb leaked breast milk. Leaking of breast milk between feedings is normal.  Use lanolin on your nipples after breastfeeding. Lanolin helps to maintain your skin's normal moisture barrier. If you use pure lanolin, you do not need to wash it off before feeding your baby again. Pure lanolin is not toxic to your baby. You may also hand express a few drops of breast milk and gently massage that milk into your nipples and allow the milk to air dry. In the first few weeks after giving birth, some women experience extremely full breasts (engorgement). Engorgement can make your breasts feel heavy, warm, and tender to the touch. Engorgement peaks within 3-5 days after you give birth. The following recommendations can help ease engorgement:  Completely empty your breasts while breastfeeding or pumping. You may want to start by applying warm, moist heat (in the shower or with warm water-soaked hand towels) just before feeding or pumping. This increases circulation and helps the milk flow. If your baby does not completely empty your breasts while breastfeeding, pump any extra milk after he or she is finished.  Wear a snug bra (nursing or regular) or tank top for 1-2 days to signal your body to slightly decrease milk production.  Apply ice packs to your breasts, unless this is too uncomfortable for you.  Make sure that your baby is latched on and  positioned properly while breastfeeding. If engorgement persists after 48 hours of following these recommendations, contact your health care provider or a lactation consultant. OVERALL HEALTH   CARE RECOMMENDATIONS WHILE BREASTFEEDING  Eat healthy foods. Alternate between meals and snacks, eating 3 of each per day. Because what you eat affects your breast milk, some of the foods may make your baby more irritable than usual. Avoid eating these foods if you are sure that they are negatively affecting your baby.  Drink milk, fruit juice, and water to satisfy your thirst (about 10 glasses a day).  Rest often, relax, and continue to take your prenatal vitamins to prevent fatigue, stress, and anemia.  Continue breast self-awareness checks.  Avoid chewing and smoking tobacco. Chemicals from cigarettes that pass into breast milk and exposure to secondhand smoke may harm your baby.  Avoid alcohol and drug use, including marijuana. Some medicines that may be harmful to your baby can pass through breast milk. It is important to ask your health care provider before taking any medicine, including all over-the-counter and prescription medicine as well as vitamin and herbal supplements. It is possible to become pregnant while breastfeeding. If birth control is desired, ask your health care provider about options that will be safe for your baby. SEEK MEDICAL CARE IF:  You feel like you want to stop breastfeeding or have become frustrated with breastfeeding.  You have painful breasts or nipples.  Your nipples are cracked or bleeding.  Your breasts are red, tender, or warm.  You have a swollen area on either breast.  You have a fever or chills.  You have nausea or vomiting.  You have drainage other than breast milk from your nipples.  Your breasts do not become full before feedings by the fifth day after you give birth.  You feel sad and depressed.  Your baby is too sleepy to eat well.  Your  baby is having trouble sleeping.   Your baby is wetting less than 3 diapers in a 24-hour period.  Your baby has less than 3 stools in a 24-hour period.  Your baby's skin or the white part of his or her eyes becomes yellow.   Your baby is not gaining weight by 5 days of age. SEEK IMMEDIATE MEDICAL CARE IF:  Your baby is overly tired (lethargic) and does not want to wake up and feed.  Your baby develops an unexplained fever.   This information is not intended to replace advice given to you by your health care provider. Make sure you discuss any questions you have with your health care provider.   Document Released: 11/23/2005 Document Revised: 08/14/2015 Document Reviewed: 05/17/2013 Else[employer] Interactive Patient Education 2016 [employer]  

## 2016-08-20 ENCOUNTER — Encounter: Payer: Self-pay | Admitting: Obstetrics and Gynecology

## 2016-08-21 LAB — CULTURE, OB URINE

## 2016-08-21 LAB — URINE CULTURE, OB REFLEX

## 2016-08-21 LAB — GC/CHLAMYDIA PROBE AMP
CHLAMYDIA, DNA PROBE: NEGATIVE
Neisseria gonorrhoeae by PCR: NEGATIVE

## 2016-08-24 LAB — PAP IG W/ RFLX HPV ASCU: PAP SMEAR COMMENT: 0

## 2016-08-26 LAB — PRENATAL PROFILE I(LABCORP)
ANTIBODY SCREEN: NEGATIVE
BASOS: 0 %
Basophils Absolute: 0 10*3/uL (ref 0.0–0.2)
EOS (ABSOLUTE): 0 10*3/uL (ref 0.0–0.4)
Eos: 0 %
HEMATOCRIT: 34.4 % (ref 34.0–46.6)
HEMOGLOBIN: 11.9 g/dL (ref 11.1–15.9)
HEP B S AG: NEGATIVE
Immature Grans (Abs): 0 10*3/uL (ref 0.0–0.1)
Immature Granulocytes: 0 %
LYMPHS ABS: 2.2 10*3/uL (ref 0.7–3.1)
LYMPHS: 33 %
MCH: 27.4 pg (ref 26.6–33.0)
MCHC: 34.6 g/dL (ref 31.5–35.7)
MCV: 79 fL (ref 79–97)
MONOS ABS: 0.5 10*3/uL (ref 0.1–0.9)
Monocytes: 7 %
Neutrophils Absolute: 4 10*3/uL (ref 1.4–7.0)
Neutrophils: 60 %
Platelets: 270 10*3/uL (ref 150–379)
RBC: 4.35 x10E6/uL (ref 3.77–5.28)
RDW: 16.2 % — AB (ref 12.3–15.4)
RPR: NONREACTIVE
Rh Factor: NEGATIVE
WBC: 6.7 10*3/uL (ref 3.4–10.8)

## 2016-08-26 LAB — TSH: TSH: 1.15 u[IU]/mL (ref 0.450–4.500)

## 2016-08-26 LAB — AFP, QUAD SCREEN
DIA MOM VALUE: 0.38
DIA VALUE (EIA): 62.57 pg/mL
DSR (BY AGE) 1 IN: 998
DSR (SECOND TRIMESTER) 1 IN: 10000
Gestational Age: 17.3 WEEKS
MATERNAL AGE AT EDD: 25.7 a
MSAFP Mom: 0.85
MSAFP: 34.6 ng/mL
MSHCG MOM: 0.51
MSHCG: 14733 m[IU]/mL
OSB RISK: 10000
T18 (By Age): 1:3887 {titer}
TEST RESULTS AFP: NEGATIVE
UE3 VALUE: 1.09 ng/mL
WEIGHT: 163 [lb_av]
uE3 Mom: 0.98

## 2016-08-26 LAB — HIV ANTIBODY (ROUTINE TESTING W REFLEX): HIV Screen 4th Generation wRfx: NONREACTIVE

## 2016-08-26 LAB — PROTEIN / CREATININE RATIO, URINE
Creatinine, Urine: 178.1 mg/dL
PROTEIN UR: 18.7 mg/dL
Protein/Creat Ratio: 105 mg/g creat (ref 0–200)

## 2016-08-26 LAB — HEMOGLOBIN A1C
Est. average glucose Bld gHb Est-mCnc: 177 mg/dL
Hgb A1c MFr Bld: 7.8 % — ABNORMAL HIGH (ref 4.8–5.6)

## 2016-08-26 LAB — CYSTIC FIBROSIS MUTATION 97: GENE DIS ANAL CARRIER INTERP BLD/T-IMP: NOT DETECTED

## 2016-08-26 LAB — TOXASSURE SELECT 13 (MW), URINE

## 2016-08-26 LAB — VARICELLA ZOSTER ANTIBODY, IGG: Varicella zoster IgG: 423 index (ref >165)

## 2016-09-01 ENCOUNTER — Encounter (HOSPITAL_COMMUNITY): Payer: Self-pay

## 2016-09-01 ENCOUNTER — Ambulatory Visit (HOSPITAL_COMMUNITY)
Admission: RE | Admit: 2016-09-01 | Discharge: 2016-09-01 | Disposition: A | Discharge status: Home / Self Care | Payer: Medicaid Other | Source: Ambulatory Visit | Attending: Obstetrics and Gynecology | Admitting: Obstetrics and Gynecology

## 2016-09-01 ENCOUNTER — Other Ambulatory Visit (HOSPITAL_COMMUNITY): Payer: Self-pay | Admitting: Maternal and Fetal Medicine

## 2016-09-01 DIAGNOSIS — Z3A18 18 weeks gestation of pregnancy: Secondary | ICD-10-CM | POA: Diagnosis not present

## 2016-09-01 DIAGNOSIS — J45909 Unspecified asthma, uncomplicated: Secondary | ICD-10-CM | POA: Diagnosis not present

## 2016-09-01 DIAGNOSIS — Z36 Encounter for antenatal screening of mother: Secondary | ICD-10-CM | POA: Insufficient documentation

## 2016-09-01 DIAGNOSIS — O09892 Supervision of other high risk pregnancies, second trimester: Secondary | ICD-10-CM

## 2016-09-01 DIAGNOSIS — Z3A24 24 weeks gestation of pregnancy: Secondary | ICD-10-CM

## 2016-09-01 DIAGNOSIS — O24112 Pre-existing diabetes mellitus, type 2, in pregnancy, second trimester: Secondary | ICD-10-CM

## 2016-09-01 DIAGNOSIS — O24912 Unspecified diabetes mellitus in pregnancy, second trimester: Secondary | ICD-10-CM

## 2016-09-02 ENCOUNTER — Other Ambulatory Visit (HOSPITAL_COMMUNITY): Payer: Self-pay | Admitting: *Deleted

## 2016-09-02 DIAGNOSIS — O24319 Unspecified pre-existing diabetes mellitus in pregnancy, unspecified trimester: Secondary | ICD-10-CM

## 2016-09-02 DIAGNOSIS — Z7984 Long term (current) use of oral hypoglycemic drugs: Principal | ICD-10-CM

## 2016-09-06 ENCOUNTER — Encounter (HOSPITAL_COMMUNITY): Payer: Self-pay | Admitting: Certified Nurse Midwife

## 2016-09-06 ENCOUNTER — Inpatient Hospital Stay (HOSPITAL_COMMUNITY)
Admission: AD | Admit: 2016-09-06 | Discharge: 2016-09-06 | Disposition: A | Discharge status: Home / Self Care | Payer: Medicaid Other | Source: Ambulatory Visit | Attending: Obstetrics & Gynecology | Admitting: Obstetrics & Gynecology

## 2016-09-06 DIAGNOSIS — O98812 Other maternal infectious and parasitic diseases complicating pregnancy, second trimester: Secondary | ICD-10-CM | POA: Insufficient documentation

## 2016-09-06 DIAGNOSIS — O24112 Pre-existing diabetes mellitus, type 2, in pregnancy, second trimester: Secondary | ICD-10-CM | POA: Diagnosis not present

## 2016-09-06 DIAGNOSIS — Z7982 Long term (current) use of aspirin: Secondary | ICD-10-CM | POA: Insufficient documentation

## 2016-09-06 DIAGNOSIS — Z3A19 19 weeks gestation of pregnancy: Secondary | ICD-10-CM | POA: Insufficient documentation

## 2016-09-06 DIAGNOSIS — Z7984 Long term (current) use of oral hypoglycemic drugs: Secondary | ICD-10-CM | POA: Diagnosis not present

## 2016-09-06 DIAGNOSIS — B3731 Acute candidiasis of vulva and vagina: Secondary | ICD-10-CM

## 2016-09-06 DIAGNOSIS — N898 Other specified noninflammatory disorders of vagina: Secondary | ICD-10-CM | POA: Diagnosis present

## 2016-09-06 DIAGNOSIS — B373 Candidiasis of vulva and vagina: Secondary | ICD-10-CM | POA: Diagnosis not present

## 2016-09-06 DIAGNOSIS — E119 Type 2 diabetes mellitus without complications: Secondary | ICD-10-CM | POA: Diagnosis not present

## 2016-09-06 LAB — URINALYSIS, ROUTINE W REFLEX MICROSCOPIC
Bilirubin Urine: NEGATIVE
GLUCOSE, UA: 100 mg/dL — AB
HGB URINE DIPSTICK: NEGATIVE
Ketones, ur: 15 mg/dL — AB
Nitrite: NEGATIVE
PH: 5.5 (ref 5.0–8.0)
Protein, ur: NEGATIVE mg/dL

## 2016-09-06 LAB — WET PREP, GENITAL
Clue Cells Wet Prep HPF POC: NONE SEEN
SPERM: NONE SEEN
Trich, Wet Prep: NONE SEEN
YEAST WET PREP: NONE SEEN

## 2016-09-06 LAB — URINE MICROSCOPIC-ADD ON

## 2016-09-06 MED ORDER — TERCONAZOLE 0.4 % VA CREA: 1.0000 | TOPICAL_CREAM | Freq: Every day | VAGINAL | 0 refills | Status: DC

## 2016-09-06 NOTE — Discharge Instructions (Signed)
Get your prescription at the pharmacy. Keep all your appointments.

## 2016-09-06 NOTE — MAU Note (Signed)
Pt states she has had some thick mucous discharge for several days. Pt denies cramping or vaginal bleeding. Pt states she feels fetus move.

## 2016-09-06 NOTE — MAU Provider Note (Signed)
History     CSN: [id number]  Arrival date and time: 09/06/16 1238   First Provider Initiated Contact with Patient 09/06/16 1353      Chief Complaint  Patient presents with  . Vaginal Discharge   HPI Cindy Bruce 25 y.o. [redacted]w[redacted]d Comes to MAU with increased vaginal discharge for 2 days.  No itching or burning.  Having a small amount of abdominal cramping - no back pain.  Takes Metformin for Type 2 diabetes in pregnancy.    OB History    Gravida Para Term Preterm AB Living   _0 0 0 3   SAB TAB Ectopic Multiple Live Births   0 0 0 0 3      Past Medical History:  Diagnosis Date  . Asthma   . Diabetes mellitus    Type 2 since 1st pg in 2008 - on insulin  . Medical history non-contributory     Past Surgical History:  Procedure Laterality Date  . TOOTH EXTRACTION      Family History  Problem Relation Age of Onset  . Diabetes Mother   . Diabetes Maternal Grandmother     Social History  Substance Use Topics  . Smoking status: Never Smoker  . Smokeless tobacco: Former [employer] developer . Alcohol use No     Comment: occ    Allergies:  Allergies  Allergen Reactions  . Food Anaphylaxis    cantaloupe  . 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 metFORMIN (GLUCOPHAGE) 1000 MG tablet Take 1 tablet (1,000 mg total) by mouth 2 (two) times daily with a meal. 60 tablet 3 09/06/2016 at Unknown time  . Prenatal Vit-Fe Fumarate-FA (MULTIVITAMIN-PRENATAL) 27-0.8 MG TABS tablet Take 1 tablet by mouth daily at 12 noon.   09/06/2016 at Unknown time  . ACCU-CHEK FASTCLIX LANCETS MISC 1 Units by Does not apply route 4 (four) times daily. 102 each 3 Taking  . aspirin EC 81 MG tablet Take 1 tablet (81 mg total) by mouth daily. (Patient not taking: Reported on 09/06/2016) 30 tablet 6 Not Taking at Unknown time  . azithromycin (ZITHROMAX Z-PAK) 250 MG tablet Two tablets 1st day then one tablet a day (Patient not  taking: Reported on 09/06/2016) 6 each 0 Not Taking at Unknown time  . Blood Glucose Monitoring Suppl (ACCU-CHEK NANO SMARTVIEW) w/Device KIT 1 Device by Does not apply route 4 (four) times daily. 1 kit 0 Taking  . glucose blood (ACCU-CHEK SMARTVIEW) test strip Use 4 times daily 51 each 12 Taking    Review of Systems  Constitutional: Negative for fever.  Gastrointestinal: Positive for abdominal pain. Negative for nausea and vomiting.  Genitourinary:       Vaginal discharge. No leaking of amniotic fluid. No vaginal bleeding. No dysuria.   Physical Exam   Blood pressure 135/69, pulse 118, temperature 97.7 F (36.5 C), temperature source Oral, resp. rate 17, height _1  (1.549 m), weight 164 lb 4 oz (74.5 kg), last menstrual period 04/20/2016, SpO2 100 %.  Physical Exam  Nursing note and vitals reviewed. Constitutional: She is oriented to person, place, and time. She appears well-developed and well-nourished.  HENT:  Head: Normocephalic.  Eyes: EOM are normal.  Neck: Neck supple.  GI: Soft. There is no tenderness.  RN heard FHT with doppler  Genitourinary:  Genitourinary Comments: Speculum exam: Vulva - some redness in folds of groin beside labia majora. Vagina - Moderate  amount of yellow discharge, no odor Cervix - No contact bleeding Bimanual exam: Cervix closed GC/Chlam, wet prep done Chaperone present for exam.   Musculoskeletal: Normal range of motion.  Neurological: She is alert and oriented to person, place, and time.  Skin: Skin is warm and dry.  Psychiatric: She has a normal mood and affect.    MAU Course  Procedures Results for orders placed or performed during the hospital encounter of 09/06/16 (from the past 24 hour(s))  Urinalysis, Routine w reflex microscopic (not at [facility])     Status: Abnormal   Collection Time: 09/06/16 12:43 PM  Result Value Ref Range   Color, Urine YELLOW YELLOW   APPearance CLEAR CLEAR   Specific Gravity, Urine >1.030 (H) 1.005 -  1.030   pH 5.5 5.0 - 8.0   Glucose, UA 100 (A) NEGATIVE mg/dL   Hgb urine dipstick NEGATIVE NEGATIVE   Bilirubin Urine NEGATIVE NEGATIVE   Ketones, ur 15 (A) NEGATIVE mg/dL   Protein, ur NEGATIVE NEGATIVE mg/dL   Nitrite NEGATIVE NEGATIVE   Leukocytes, UA SMALL (A) NEGATIVE  Urine microscopic-add on     Status: Abnormal   Collection Time: 09/06/16 12:43 PM  Result Value Ref Range   Squamous Epithelial / LPF 0-5 (A) NONE SEEN   WBC, UA 6-30 0 - 5 WBC/hpf   RBC / HPF 0-5 0 - 5 RBC/hpf   Bacteria, UA FEW (A) NONE SEEN  Wet prep, genital     Status: Abnormal   Collection Time: 09/06/16  2:00 PM  Result Value Ref Range   Yeast Wet Prep HPF POC NONE SEEN NONE SEEN   Trich, Wet Prep NONE SEEN NONE SEEN   Clue Cells Wet Prep HPF POC NONE SEEN NONE SEEN   WBC, Wet Prep HPF POC MANY (A) NONE SEEN   Sperm NONE SEEN     MDM Has lots of WBC in wet prep but no yeast seen, GC/Chlam pending.  Will treat for yeast - vulvar tissue consistent with yeast.    Assessment and Plan  Yeast infection  Plan Will eprescribe terazol vaginal cream Keep all prenatal appointments. Continue to check blood sugars as instructed. Drink at least 8 8-oz glasses of water every day. Labs pending.   Lorris Carducci L Sheralyn Pinegar 09/06/2016, 2:05 PM

## 2016-09-07 LAB — GC/CHLAMYDIA PROBE AMP (~~LOC~~) NOT AT ARMC
Chlamydia: NEGATIVE
Neisseria Gonorrhea: NEGATIVE

## 2016-09-16 ENCOUNTER — Ambulatory Visit (INDEPENDENT_AMBULATORY_CARE_PROVIDER_SITE_OTHER): Payer: Medicaid Other | Admitting: Obstetrics and Gynecology

## 2016-09-16 VITALS — BP 108/51 | HR 101 | Wt 166.0 lb

## 2016-09-16 DIAGNOSIS — O0990 Supervision of high risk pregnancy, unspecified, unspecified trimester: Secondary | ICD-10-CM

## 2016-09-16 DIAGNOSIS — O9989 Other specified diseases and conditions complicating pregnancy, childbirth and the puerperium: Secondary | ICD-10-CM

## 2016-09-16 DIAGNOSIS — Z6791 Unspecified blood type, Rh negative: Secondary | ICD-10-CM

## 2016-09-16 DIAGNOSIS — Z283 Underimmunization status: Secondary | ICD-10-CM

## 2016-09-16 DIAGNOSIS — Z23 Encounter for immunization: Secondary | ICD-10-CM

## 2016-09-16 DIAGNOSIS — O24912 Unspecified diabetes mellitus in pregnancy, second trimester: Secondary | ICD-10-CM

## 2016-09-16 DIAGNOSIS — O26899 Other specified pregnancy related conditions, unspecified trimester: Secondary | ICD-10-CM

## 2016-09-16 DIAGNOSIS — Z2839 Other underimmunization status: Secondary | ICD-10-CM

## 2016-09-16 DIAGNOSIS — O24919 Unspecified diabetes mellitus in pregnancy, unspecified trimester: Secondary | ICD-10-CM

## 2016-09-16 MED ORDER — INSULIN STARTER KIT- PEN NEEDLES (ENGLISH): 1.0000 | Freq: Once | Status: DC

## 2016-09-16 MED ORDER — INSULIN REGULAR HUMAN 100 UNIT/ML IJ SOLN: INTRAMUSCULAR | 11 refills | Status: DC

## 2016-09-16 MED ORDER — INSULIN NPH (HUMAN) (ISOPHANE) 100 UNIT/ML ~~LOC~~ SUSP: SUBCUTANEOUS | 3 refills | Status: DC

## 2016-09-16 MED ORDER — INSULIN STARTER KIT- SYRINGES (ENGLISH): 1.0000 | Freq: Once | Status: DC

## 2016-09-16 NOTE — Progress Notes (Signed)
Subjective:  Cindy Bruce is a 25 y.o. [phone] at 2557w2d being seen today for ongoing prenatal care.  She is currently monitored for the following issues for this high-risk pregnancy and has Asthma; Rubella non-immune status, antepartum; Rh negative state in antepartum period; Diabetes mellitus complicating pregnancy, antepartum; and Supervision of high risk pregnancy, antepartum on her problem list.  Patient reports unable tolerate metformin. Glucose levels have been elevated fasting and 2 hr as well..  Contractions: Not present. Vag. Bleeding: None.  Movement: Present. Denies leaking of fluid.   The following portions of the patient's history were reviewed and updated as appropriate: allergies, current medications, past family history, past medical history, past social history, past surgical history and problem list. Problem list updated.  Objective:   Vitals:   09/16/16 1019  BP: (!) 108/51  Pulse: (!) 101  Weight: 166 lb (75.3 kg)    Fetal Status:     Movement: Present     General:  Alert, oriented and cooperative. Patient is in no acute distress.  Skin: Skin is warm and dry. No rash noted.   Cardiovascular: Normal heart rate noted  Respiratory: Normal respiratory effort, no problems with respiration noted  Abdomen: Soft, gravid, appropriate for gestational age. Pain/Pressure: Present     Pelvic:  Cervical exam deferred        Extremities: Normal range of motion.  Edema: None  Mental Status: Normal mood and affect. Normal behavior. Normal judgment and thought content.   Urinalysis: Urine Protein: Trace Urine Glucose: Negative  Assessment and Plan:  Pregnancy: G4P3003 at 2557w2d  1. Supervision of high risk pregnancy, antepartum Flu vaccine today Fetal ECHO scheduled  2. Rh negative state in antepartum period Rhogam at 28 wks  3. Rubella non-immune status, antepartum Vaccine PP  4. Diabetes mellitus complicating pregnancy, antepartum Will d/c Metformin and start insulin.  Pt use insulin with last 3 pregnancies.  Has appt with nutrition next week. Will call and add instruction for injections. - insulin NPH Human (HUMULIN N,NOVOLIN N) 100 UNIT/ML injection; 18 units subcutaneous in morning 12 units subcutaneous in evening  Dispense: 10 mL; Refill: 3 - insulin regular (HUMULIN R) 100 units/mL injection; 12 units subcutaneous in morning 12 units subcutaneous in evening  Dispense: 10 mL; Refill: 11  Preterm labor symptoms and general obstetric precautions including but not limited to vaginal bleeding, contractions, leaking of fluid and fetal movement were reviewed in detail with the patient. Please refer to After Visit Summary for other counseling recommendations.  No Follow-up on file.   Hermina StaggersMichael L Yarithza Mink, MD

## 2016-09-16 NOTE — Progress Notes (Signed)
Pt has CBG values - appears to have elevated fasting readings. She states metfomin is causing nausea/vomiting/diarrhea

## 2016-09-16 NOTE — Addendum Note (Signed)
Addended by: Arne ClevelandHUTCHINSON, Karey Suthers J on: 09/16/2016 11:55 AM   Modules accepted: Orders

## 2016-09-17 ENCOUNTER — Telehealth: Payer: Self-pay | Admitting: *Deleted

## 2016-09-17 NOTE — Telephone Encounter (Signed)
Patient called in today stating Dr. Alysia PennaErvin prescribed her some Medication for her Diabetes yesterday but when she got to the pharmacy it was not there. It was for Insulin and patient really needs it... Please Advise.Cindy Bruce..Cindy Bruce

## 2016-09-18 ENCOUNTER — Other Ambulatory Visit: Payer: Self-pay | Admitting: *Deleted

## 2016-09-18 DIAGNOSIS — O24919 Unspecified diabetes mellitus in pregnancy, unspecified trimester: Secondary | ICD-10-CM

## 2016-09-18 MED ORDER — INSULIN REGULAR HUMAN 100 UNIT/ML IJ SOLN: INTRAMUSCULAR | 11 refills | Status: DC

## 2016-09-18 MED ORDER — INSULIN NPH (HUMAN) (ISOPHANE) 100 UNIT/ML ~~LOC~~ SUSP: SUBCUTANEOUS | 3 refills | Status: DC

## 2016-09-18 NOTE — Telephone Encounter (Signed)
Rx called to pharmacy

## 2016-09-23 ENCOUNTER — Ambulatory Visit: Payer: Medicaid Other | Admitting: *Deleted

## 2016-09-30 ENCOUNTER — Encounter: Payer: Medicaid Other | Admitting: Obstetrics and Gynecology

## 2016-10-01 ENCOUNTER — Ambulatory Visit (HOSPITAL_COMMUNITY)
Admission: RE | Admit: 2016-10-01 | Discharge: 2016-10-01 | Disposition: A | Discharge status: Home / Self Care | Payer: Medicaid Other | Source: Ambulatory Visit

## 2016-10-13 ENCOUNTER — Encounter (HOSPITAL_COMMUNITY): Payer: Self-pay

## 2016-10-13 ENCOUNTER — Ambulatory Visit (HOSPITAL_COMMUNITY)
Admission: RE | Admit: 2016-10-13 | Discharge: 2016-10-13 | Disposition: A | Discharge status: Home / Self Care | Payer: Medicaid Other | Source: Ambulatory Visit | Attending: Obstetrics and Gynecology | Admitting: Obstetrics and Gynecology

## 2016-10-13 ENCOUNTER — Other Ambulatory Visit (HOSPITAL_COMMUNITY): Payer: Self-pay | Admitting: Maternal and Fetal Medicine

## 2016-10-13 DIAGNOSIS — O24112 Pre-existing diabetes mellitus, type 2, in pregnancy, second trimester: Secondary | ICD-10-CM | POA: Insufficient documentation

## 2016-10-13 DIAGNOSIS — O99512 Diseases of the respiratory system complicating pregnancy, second trimester: Secondary | ICD-10-CM | POA: Diagnosis not present

## 2016-10-13 DIAGNOSIS — Z3A24 24 weeks gestation of pregnancy: Secondary | ICD-10-CM | POA: Diagnosis not present

## 2016-10-13 DIAGNOSIS — J45909 Unspecified asthma, uncomplicated: Secondary | ICD-10-CM | POA: Insufficient documentation

## 2016-10-13 DIAGNOSIS — O0990 Supervision of high risk pregnancy, unspecified, unspecified trimester: Secondary | ICD-10-CM

## 2016-10-27 ENCOUNTER — Ambulatory Visit (INDEPENDENT_AMBULATORY_CARE_PROVIDER_SITE_OTHER): Payer: Medicaid Other | Admitting: Obstetrics & Gynecology

## 2016-10-27 DIAGNOSIS — O0990 Supervision of high risk pregnancy, unspecified, unspecified trimester: Secondary | ICD-10-CM

## 2016-10-27 DIAGNOSIS — O24912 Unspecified diabetes mellitus in pregnancy, second trimester: Secondary | ICD-10-CM

## 2016-10-27 NOTE — Progress Notes (Signed)
   PRENATAL VISIT NOTE  Subjective:  Cindy Bruce is a 25 y.o. [phone] at 5921w3d being seen today for ongoing prenatal care.  She is currently monitored for the following issues for this high-risk pregnancy and has Asthma; Rubella non-immune status, antepartum; Rh negative state in antepartum period; Diabetes mellitus complicating pregnancy, antepartum; and Supervision of high risk pregnancy, antepartum on her problem list.  Patient reports no complaints.  Contractions: Irregular. Vag. Bleeding: None.  Movement: Present. Denies leaking of fluid.   The following portions of the patient's history were reviewed and updated as appropriate: allergies, current medications, past family history, past medical history, past social history, past surgical history and problem list. Problem list updated.  Objective:   Vitals:   10/27/16 0843  BP: 121/72  Pulse: (!) 102  Temp: 98 F (36.7 C)  Weight: 167 lb 6.4 oz (75.9 kg)    Fetal Status: Fetal Heart Rate (bpm): 152   Movement: Present     General:  Alert, oriented and cooperative. Patient is in no acute distress.  Skin: Skin is warm and dry. No rash noted.   Cardiovascular: Normal heart rate noted  Respiratory: Normal respiratory effort, no problems with respiration noted  Abdomen: Soft, gravid, appropriate for gestational age. Pain/Pressure: Present     Pelvic:  Cervical exam deferred        Extremities: Normal range of motion.  Edema: None  Mental Status: Normal mood and affect. Normal behavior. Normal judgment and thought content.   Assessment and Plan:  Pregnancy: G4P3003 at 921w3d  1. Supervision of high risk pregnancy, antepartum Dates calculated by her 18.3 week US - US MFM OB FOLLOW UP; Future Class B DM, states BG in range in insuline, will bring her book next time Preterm labor symptoms and general obstetric precautions including but not limited to vaginal bleeding, contractions, leaking of fluid and fetal movement were reviewed  in detail with the patient. Please refer to After Visit Summary for other counseling recommendations.  Return in about 2 weeks (around 11/10/2016).   Adam PhenixJames G Leea Rambeau, MD

## 2016-10-27 NOTE — Progress Notes (Signed)
Patient reports uterine irritability throughout the day for the last couple of weeks, denies bleeding and reports good fetal movment.

## 2016-11-10 ENCOUNTER — Other Ambulatory Visit: Payer: Self-pay | Admitting: Obstetrics & Gynecology

## 2016-11-10 ENCOUNTER — Encounter (HOSPITAL_COMMUNITY): Payer: Self-pay

## 2016-11-10 ENCOUNTER — Ambulatory Visit (HOSPITAL_COMMUNITY)
Admission: RE | Admit: 2016-11-10 | Discharge: 2016-11-10 | Disposition: A | Discharge status: Home / Self Care | Payer: Medicaid Other | Source: Ambulatory Visit | Attending: Obstetrics & Gynecology | Admitting: Obstetrics & Gynecology

## 2016-11-10 DIAGNOSIS — O99513 Diseases of the respiratory system complicating pregnancy, third trimester: Secondary | ICD-10-CM | POA: Insufficient documentation

## 2016-11-10 DIAGNOSIS — J45909 Unspecified asthma, uncomplicated: Secondary | ICD-10-CM

## 2016-11-10 DIAGNOSIS — Z362 Encounter for other antenatal screening follow-up: Secondary | ICD-10-CM

## 2016-11-10 DIAGNOSIS — O0990 Supervision of high risk pregnancy, unspecified, unspecified trimester: Secondary | ICD-10-CM

## 2016-11-10 DIAGNOSIS — O99519 Diseases of the respiratory system complicating pregnancy, unspecified trimester: Secondary | ICD-10-CM

## 2016-11-10 DIAGNOSIS — Z3A28 28 weeks gestation of pregnancy: Secondary | ICD-10-CM | POA: Diagnosis not present

## 2016-11-10 DIAGNOSIS — O24113 Pre-existing diabetes mellitus, type 2, in pregnancy, third trimester: Secondary | ICD-10-CM

## 2016-11-11 ENCOUNTER — Other Ambulatory Visit (HOSPITAL_COMMUNITY): Payer: Self-pay | Admitting: *Deleted

## 2016-11-11 DIAGNOSIS — IMO0001 Reserved for inherently not codable concepts without codable children: Secondary | ICD-10-CM

## 2016-11-11 DIAGNOSIS — Z794 Long term (current) use of insulin: Principal | ICD-10-CM

## 2016-11-11 DIAGNOSIS — O24313 Unspecified pre-existing diabetes mellitus in pregnancy, third trimester: Principal | ICD-10-CM

## 2016-11-16 ENCOUNTER — Inpatient Hospital Stay (HOSPITAL_COMMUNITY)
Admission: AD | Admit: 2016-11-16 | Discharge: 2016-11-16 | Disposition: A | Discharge status: Home / Self Care | Payer: Medicaid Other | Source: Ambulatory Visit | Attending: Family Medicine | Admitting: Family Medicine

## 2016-11-16 ENCOUNTER — Encounter (HOSPITAL_COMMUNITY): Payer: Self-pay | Admitting: *Deleted

## 2016-11-16 ENCOUNTER — Encounter: Payer: Self-pay | Admitting: Obstetrics and Gynecology

## 2016-11-16 DIAGNOSIS — E119 Type 2 diabetes mellitus without complications: Secondary | ICD-10-CM | POA: Diagnosis not present

## 2016-11-16 DIAGNOSIS — Z794 Long term (current) use of insulin: Secondary | ICD-10-CM | POA: Insufficient documentation

## 2016-11-16 DIAGNOSIS — Z87891 Personal history of nicotine dependence: Secondary | ICD-10-CM | POA: Insufficient documentation

## 2016-11-16 DIAGNOSIS — Z7982 Long term (current) use of aspirin: Secondary | ICD-10-CM | POA: Insufficient documentation

## 2016-11-16 DIAGNOSIS — O24119 Pre-existing diabetes mellitus, type 2, in pregnancy, unspecified trimester: Secondary | ICD-10-CM

## 2016-11-16 DIAGNOSIS — Z3A29 29 weeks gestation of pregnancy: Secondary | ICD-10-CM | POA: Insufficient documentation

## 2016-11-16 DIAGNOSIS — R109 Unspecified abdominal pain: Secondary | ICD-10-CM

## 2016-11-16 DIAGNOSIS — O24113 Pre-existing diabetes mellitus, type 2, in pregnancy, third trimester: Secondary | ICD-10-CM | POA: Diagnosis not present

## 2016-11-16 DIAGNOSIS — O26893 Other specified pregnancy related conditions, third trimester: Secondary | ICD-10-CM | POA: Diagnosis not present

## 2016-11-16 LAB — CBC
HCT: 29.2 % — ABNORMAL LOW (ref 36.0–46.0)
HEMOGLOBIN: 10 g/dL — AB (ref 12.0–15.0)
MCH: 27.2 pg (ref 26.0–34.0)
MCHC: 34.2 g/dL (ref 30.0–36.0)
MCV: 79.6 fL (ref 78.0–100.0)
Platelets: 229 10*3/uL (ref 150–400)
RBC: 3.67 MIL/uL — AB (ref 3.87–5.11)
RDW: 14.7 % (ref 11.5–15.5)
WBC: 8.8 10*3/uL (ref 4.0–10.5)

## 2016-11-16 LAB — URINALYSIS, ROUTINE W REFLEX MICROSCOPIC
Bacteria, UA: NONE SEEN
Bilirubin Urine: NEGATIVE
Ketones, ur: 5 mg/dL — AB
Leukocytes, UA: NEGATIVE
Nitrite: NEGATIVE
PH: 5 (ref 5.0–8.0)
Protein, ur: NEGATIVE mg/dL
SPECIFIC GRAVITY, URINE: 1.037 — AB (ref 1.005–1.030)

## 2016-11-16 LAB — COMPREHENSIVE METABOLIC PANEL
ALK PHOS: 82 U/L (ref 38–126)
ALT: 8 U/L — AB (ref 14–54)
AST: 12 U/L — AB (ref 15–41)
Albumin: 2.8 g/dL — ABNORMAL LOW (ref 3.5–5.0)
Anion gap: 10 (ref 5–15)
CALCIUM: 8.5 mg/dL — AB (ref 8.9–10.3)
CO2: 20 mmol/L — AB (ref 22–32)
CREATININE: 0.37 mg/dL — AB (ref 0.44–1.00)
Chloride: 104 mmol/L (ref 101–111)
Glucose, Bld: 161 mg/dL — ABNORMAL HIGH (ref 65–99)
Potassium: 3.4 mmol/L — ABNORMAL LOW (ref 3.5–5.1)
Sodium: 134 mmol/L — ABNORMAL LOW (ref 135–145)
Total Bilirubin: 0.3 mg/dL (ref 0.3–1.2)
Total Protein: 7.4 g/dL (ref 6.5–8.1)

## 2016-11-16 LAB — GLUCOSE, CAPILLARY
GLUCOSE-CAPILLARY: 119 mg/dL — AB (ref 65–99)
Glucose-Capillary: 181 mg/dL — ABNORMAL HIGH (ref 65–99)

## 2016-11-16 MED ORDER — INSULIN NPH (HUMAN) (ISOPHANE) 100 UNIT/ML ~~LOC~~ SUSP
12.0000 [IU] | Freq: Once | SUBCUTANEOUS | Status: AC
  Administered 2016-11-16: 12 [IU] via SUBCUTANEOUS
  Filled 2016-11-16: qty 10

## 2016-11-16 MED ORDER — INSULIN REGULAR HUMAN 100 UNIT/ML IJ SOLN
12.0000 [IU] | Freq: Once | INTRAMUSCULAR | Status: AC
  Administered 2016-11-16: 12 [IU] via SUBCUTANEOUS
  Filled 2016-11-16: qty 1

## 2016-11-16 MED ORDER — COMFORT FIT MATERNITY SUPP SM MISC: 1.0000 [IU] | Freq: Every day | 0 refills | Status: DC | PRN

## 2016-11-16 NOTE — MAU Provider Note (Signed)
History     CSN: [id number]  Arrival date and time: 11/16/16 1857   None     Chief Complaint  Patient presents with  . Abdominal Cramping   G4P3003 '@29'$ .2 weeks here with c/o pelvic pressure and lower abdominal cramping since today. She also reports worsening back pain which is not a new sx. She denies VB, vaginal discharge, or LOF. She reports good FM. She denies urinary sx. ? fever and chills last night but did not check. She also reports an episode of dizziness around 1700. She had recently drank a cup of grape soda and had "lots of mac and cheese and chicken tenders" for dinner. She reports adequate water intake today.     OB History    Gravida Para Term Preterm AB Living   '4 3 3 '$ 0 0 3   SAB TAB Ectopic Multiple Live Births   0 0 0 0 3      Past Medical History:  Diagnosis Date  . Asthma   . 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 TOOTH EXTRACTION      Family History  Problem Relation Age of Onset  . Diabetes Mother   . Diabetes Maternal Grandmother     Social History  Substance Use Topics  . Smoking status: Former Smoker    Packs/day: 1.00    Types: Cigarettes  . Smokeless tobacco: Former Systems developer  . Alcohol use No     Comment: occ    Allergies:  Allergies  Allergen Reactions  . Food Anaphylaxis    cantaloupe  . 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 insulin starter kit- pen needles (English) 1 kit  1 kit Other Once Chancy Milroy, MD      . insulin starter kit- syringes (English) 1 kit  1 kit Other Once Chancy Milroy, MD       Prescriptions Prior to Admission  Medication Sig Dispense Refill Last Dose  . acetaminophen (TYLENOL) 325 MG tablet Take 650 mg by mouth every 6 (six) hours as needed for mild pain or headache.   Past Month at Unknown  time  . insulin NPH Human (HUMULIN N,NOVOLIN N) 100 UNIT/ML injection 18 units subcutaneous in morning 12 units subcutaneous in evening (Patient taking differently: Inject 12-18 Units into the skin 2 (two) times daily before a meal. 18 units subcutaneous in morning 12 units subcutaneous in evening) 10 mL 3 11/16/2016 at Unknown time  . insulin regular (HUMULIN R) 100 units/mL injection 12 units subcutaneous in morning 12 units subcutaneous in evening (Patient taking differently: Inject 12 Units into the skin 2 (two) times daily before a meal. 12 units subcutaneous in morning 12 units subcutaneous in evening) 10 mL 11 11/16/2016 at Unknown time  . Prenatal Vit-Fe Fumarate-FA (PRENATAL MULTIVITAMIN) TABS tablet Take 1 tablet by mouth daily at 12 noon.   Past Week at Unknown time  . ACCU-CHEK FASTCLIX LANCETS MISC 1 Units by Does not apply route 4 (four) times daily. 102 each 3 Taking  . aspirin EC 81 MG tablet Take 1 tablet (81 mg total) by mouth daily. (Patient not taking: Reported on 11/16/2016) 30 tablet 6 Not Taking at Unknown time  . Blood Glucose Monitoring Suppl (ACCU-CHEK NANO SMARTVIEW) w/Device KIT 1 Device by Does not apply route  4 (four) times daily. 1 kit 0 Taking  . glucose blood (ACCU-CHEK SMARTVIEW) test strip Use 4 times daily 51 each 12 Taking    Review of Systems  Constitutional: Negative.   Gastrointestinal: Positive for abdominal pain. Negative for constipation, diarrhea, nausea and vomiting.  Genitourinary: Negative.   Neurological: Positive for dizziness.   Physical Exam   Blood pressure 127/67, pulse 107, temperature 98.4 F (36.9 C), temperature source Oral, resp. rate 16, height '5\' 1"'$  (1.549 m), weight 77.1 kg (170 lb), last menstrual period 04/20/2016.  Patient Vitals for the past 24 hrs:  BP Temp Temp src Pulse Resp Height Weight  11/16/16 2010 123/71 - - 105 - - -  11/16/16 2009 119/66 - - 102 - - -  11/16/16 2008 122/66 - - 95 - - -  11/16/16 1930 127/67 98.4  F (36.9 C) Oral 107 16 '5\' 1"'$  (1.549 m) 77.1 kg (170 lb)    Physical Exam  Constitutional: She is oriented to person, place, and time. She appears well-developed and well-nourished. No distress.  HENT:  Head: Normocephalic and atraumatic.  Neck: Normal range of motion. Neck supple.  Cardiovascular: Normal rate.   Respiratory: Effort normal.  GI: Soft. She exhibits no distension. There is no tenderness.  gravid  Genitourinary:  Genitourinary Comments: SVE: closed/thick  Musculoskeletal: Normal range of motion.  Neurological: She is alert and oriented to person, place, and time.  Skin: Skin is warm and dry.  Psychiatric: She has a normal mood and affect.   EFM: 130 bpm, mod variability, + accels, no decels Toco: none  MAU Course  Procedures Insulin NPH 12 U Insulin Regular 12 U  Results for orders placed or performed during the hospital encounter of 11/16/16 (from the past 24 hour(s))  Urinalysis, Routine w reflex microscopic     Status: Abnormal   Collection Time: 11/16/16  7:25 PM  Result Value Ref Range   Color, Urine YELLOW YELLOW   APPearance CLEAR CLEAR   Specific Gravity, Urine 1.037 (H) 1.005 - 1.030   pH 5.0 5.0 - 8.0   Glucose, UA >=500 (A) NEGATIVE mg/dL   Hgb urine dipstick SMALL (A) NEGATIVE   Bilirubin Urine NEGATIVE NEGATIVE   Ketones, ur 5 (A) NEGATIVE mg/dL   Protein, ur NEGATIVE NEGATIVE mg/dL   Nitrite NEGATIVE NEGATIVE   Leukocytes, UA NEGATIVE NEGATIVE   RBC / HPF TOO NUMEROUS TO COUNT 0 - 5 RBC/hpf   WBC, UA 0-5 0 - 5 WBC/hpf   Bacteria, UA NONE SEEN NONE SEEN   Squamous Epithelial / LPF 0-5 (A) NONE SEEN  Glucose, capillary     Status: Abnormal   Collection Time: 11/16/16  7:57 PM  Result Value Ref Range   Glucose-Capillary 181 (H) 65 - 99 mg/dL   Comment 1 Call MD NNP PA CNM   CBC     Status: Abnormal   Collection Time: 11/16/16  8:34 PM  Result Value Ref Range   WBC 8.8 4.0 - 10.5 K/uL   RBC 3.67 (L) 3.87 - 5.11 MIL/uL   Hemoglobin 10.0  (L) 12.0 - 15.0 g/dL   HCT 29.2 (L) 36.0 - 46.0 %   MCV 79.6 78.0 - 100.0 fL   MCH 27.2 26.0 - 34.0 pg   MCHC 34.2 30.0 - 36.0 g/dL   RDW 14.7 11.5 - 15.5 %   Platelets 229 150 - 400 K/uL  Comprehensive metabolic panel     Status: Abnormal   Collection Time: 11/16/16  8:34 PM  Result Value Ref Range   Sodium 134 (L) 135 - 145 mmol/L   Potassium 3.4 (L) 3.5 - 5.1 mmol/L   Chloride 104 101 - 111 mmol/L   CO2 20 (L) 22 - 32 mmol/L   Glucose, Bld 161 (H) 65 - 99 mg/dL   BUN <5 (L) 6 - 20 mg/dL   Creatinine, Ser 0.37 (L) 0.44 - 1.00 mg/dL   Calcium 8.5 (L) 8.9 - 10.3 mg/dL   Total Protein 7.4 6.5 - 8.1 g/dL   Albumin 2.8 (L) 3.5 - 5.0 g/dL   AST 12 (L) 15 - 41 U/L   ALT 8 (L) 14 - 54 U/L   Alkaline Phosphatase 82 38 - 126 U/L   Total Bilirubin 0.3 0.3 - 1.2 mg/dL   GFR calc non Af Amer >60 >60 mL/min   GFR calc Af Amer >60 >60 mL/min   Anion gap 10 5 - 15  Glucose, capillary     Status: Abnormal   Collection Time: 11/16/16  9:58 PM  Result Value Ref Range   Glucose-Capillary 119 (H) 65 - 99 mg/dL     MDM Labs ordered and reviewed. No evidence of PTL.  Care turned over to Jorje Guild FNP by Julianne Handler CNM at 2100   CBG down after insulin Assessment and Plan  A: 1. Abdominal pain during pregnancy, third trimester   2. Pre-existing type 2 diabetes mellitus during pregnancy, antepartum    P: Discharge home Reschedule missed ob appt Discussed reasons to return to MAU Continue DM meds as prescribed Rx maternity support belt  Jorje Guild, NP

## 2016-11-16 NOTE — Discharge Instructions (Signed)
Abdominal Pain During Pregnancy Belly (abdominal) pain is common during pregnancy. Most of the time, it is not a serious problem. Other times, it can be a sign that something is wrong with the pregnancy. Always tell your doctor if you have belly pain. Follow these instructions at home: Monitor your belly pain for any changes. The following actions may help you feel better:  Do not have sex (intercourse) or put anything in your vagina until you feel better.  Rest until your pain stops.  Drink clear fluids if you feel sick to your stomach (nauseous). Do not eat solid food until you feel better.  Only take medicine as told by your doctor.  Keep all doctor visits as told. Get help right away if:  You are bleeding, leaking fluid, or pieces of tissue come out of your vagina.  You have more pain or cramping.  You keep throwing up (vomiting).  You have pain when you pee (urinate) or have blood in your pee.  You have a fever.  You do not feel your baby moving as much.  You feel very weak or feel like passing out.  You have trouble breathing, with or without belly pain.  You have a very bad headache and belly pain.  You have fluid leaking from your vagina and belly pain.  You keep having watery poop (diarrhea).  Your belly pain does not go away after resting, or the pain gets worse. This information is not intended to replace advice given to you by your health care provider. Make sure you discuss any questions you have with your health care provider. Document Released: 11/11/2009 Document Revised: 07/01/2016 Document Reviewed: 06/22/2013 Elsevier Interactive Patient Education  2017 Elsevier Inc[employer]  Type 1 or Type 2 Diabetes Mellitus During Pregnancy, Self Care Caring for yourself during your pregnancy when you have type 1 diabetes (type 1 diabetes mellitus) or type 2 diabetes (type 2 diabetes mellitus) means keeping your blood sugar (glucose) under control with a balance  of:  Nutrition.  Exercise.  Lifestyle changes.  Insulin or medicines, if necessary.  Support from your team of health care providers and others. The following information explains what you need to know to manage your diabetes at home during your pregnancy. What do I need to do to manage my blood glucose?  Check your blood glucose every day, as often as told by your health care provider.  Contact your health care provider if your blood glucose is above your target for 2 tests in a row.  Have your A1c (hemoglobin A1c) level checked at least two times a year, or as often as told by your health care provider. Your health care provider will set individualized treatment goals for you. Generally, the goal of treatment is to maintain the following blood glucose levels during pregnancy:  After not eating (after fasting) for 8 hours: at or below 95 mg/dL (5.3 mmol/L).  After meals (postprandial):  One hour after a meal: at or below 140 mg/dL (7.8 mmol/L).  Two hours after a meal: at or below 120 mg/dL (6.7 mmol/L).  A1c level: 6-6.5% What do I need to know about hyperglycemia and hypoglycemia? What is hyperglycemia? Hyperglycemia, also called high blood glucose, occurs when blood glucose is too high. Make sure you know the early signs of hyperglycemia, such as:  Increased thirst.  Hunger.  Feeling very tired.  Needing to urinate more often than usual.  Blurry vision. What is hypoglycemia?  Hypoglycemia, also called low blood glucose, occurs  with a blood glucose level at or below 70 mg/dL (3.9 mmol/L). The risk for hypoglycemia increases during or after exercise, during sleep, during illness, and when skipping meals or fasting. It is important to know the symptoms of hypoglycemia and treat it right away. Always have a 15-gram rapid-acting carbohydrate snack with you to treat low blood glucose. Family members and close friends should also know the symptoms and should understand how to  treat hypoglycemia, in case you are not able to treat yourself. What are the symptoms of hypoglycemia?  Hypoglycemia symptoms can include:  Hunger.  Anxiety.  Sweating and feeling clammy.  Confusion.  Dizziness or feeling light-headed.  Sleepiness.  Nausea.  Increased heart rate.  Headache.  Blurry vision.  Seizure.  Nightmares.  Tingling or numbness around the mouth, lips, or tongue.  A change in speech.  Decreased ability to concentrate.  A change in coordination.  Restless sleep.  Tremors or shakes.  Fainting.  Irritability. How do I treat hypoglycemia?  If you are alert and able to swallow safely, follow the 15:15 rule:  Take 15 grams of a rapid-acting carbohydrate. Rapid-acting options include:  1 tube of glucose gel.  3 glucose pills.  6-8 pieces of hard candy.  4 oz (120 mL) of fruit juice .  4 oz (120 mL) of regular (not diet) soda.  Check your blood glucose 15 minutes after you take the carbohydrate.  If the repeat blood glucose level is still at or below 70 mg/dL (3.9 mmol/L), take 15 grams of a carbohydrate again.  If your blood glucose level does not increase above 70 mg/dL (3.9 mmol/L) after 3 tries, seek emergency medical care.  After your blood glucose level returns to normal, eat a meal or a snack within 1 hour. How do I treat severe hypoglycemia?  Severe hypoglycemia is when your blood glucose level is at or below 54 mg/dL (3 mmol/L). Severe hypoglycemia is an emergency. Do not wait to see if the symptoms will go away. Get medical help right away. Call your local emergency services (911 in the [place] Do not drive yourself to the hospital. If you have severe hypoglycemia and you cannot eat or drink, you may need an injection of glucagon. A family member or close friend should learn how to check your blood glucose and how to give you a glucagon injection. Ask your health care provider if you need to have an emergency glucagon injection  kit available. Severe hypoglycemia may need to be treated in a hospital. The treatment may include getting glucose through an IV tube. You may also need treatment for the cause of your hypoglycemia. Can having diabetes put me at risk for other conditions? Having diabetes can put you at risk for other long-term (chronic) conditions, such as heart disease and kidney disease. Your health care provider may prescribe medicines to help prevent complications from diabetes. These medicines may include:  Aspirin.  Medicine to lower cholesterol.  Medicine to control blood pressure. What else can I do to manage my diabetes? Take your diabetes medicines as told  If your health care provider prescribed insulin or diabetes medicines, take them every day.  Do not run out of insulin or other diabetes medicines that you take. Plan ahead so you always have these available.  If you use insulin, adjust your dosage based on how physically active you are and what foods you eat. Your health care provider will tell you how to adjust your dosage. Your health care provider  may recommend that you take one low-dose aspirin (81 mg) each day to help prevent high blood pressure during pregnancy (preeclampsia or eclampsia). You may be at risk for preeclampsia or eclampsia if:  You had any of the following during a previous pregnancy:  Preeclampsia or eclampsia.  A fetal growth rate that was slower than normal.  An early (preterm) birth.  Separation of the placenta from the uterus (placental abruption).  Fetal loss.  You are pregnant with more than one baby.  You have other medical conditions, such as high blood pressure or an autoimmune disease. Make healthy food choices  The things that you eat and drink affect your blood glucose and your insulin dosage. Making good choices helps to control your diabetes and prevent other health problems. A healthy meal plan includes eating lean proteins, complex carbohydrates,  fresh fruits and vegetables, low-fat dairy products, and healthy fats. Make an appointment to see a diet and nutrition specialist (registered dietitian) to help you create an eating plan that is right for you. Make sure that you:  Follow instructions from your health care provider about eating or drinking restrictions.  Drink enough fluid to keep your urine clear or pale yellow.  Eat healthy snacks between nutritious meals.  Track the carbohydrates that you eat. Do this by reading food labels and learning the standard serving sizes of foods.  Follow your sick day plan whenever you cannot eat or drink as usual. Make this plan in advance with your health care provider. Stay active   Do at least 30 minutes of physical activity a day, or as much physical activity as your health care provider recommends during your pregnancy.  If you start a new exercise or activity, work with your health care provider to adjust your insulin, medicines, or food intake as needed. Make healthy lifestyle choices  Do not use any tobacco products, such as cigarettes, chewing tobacco, and e-cigarettes. If you need help quitting, ask your health care provider.  Do not use alcohol.  Learn to manage stress. If you need help with this, ask your health care provider. Care for your body  Keep your immunizations up to date.  Schedule an eye exam during your first trimester of your pregnancy, or as told by your health care provider.  Check your skin and feet every day for cuts, bruises, redness, blisters, or sores. Schedule a foot exam with your health care provider once every year.  Brush your teeth and gums two times a day, and floss at least one time a day. Visit your dentist at least once every 6 months.  Maintain a healthy weight during your pregnancy. General instructions   Take over-the-counter and prescription medicines only as told by your health care provider.  Talk with your health care provider about  your risk for high blood pressure during pregnancy (preeclampsia or eclampsia).  Share your diabetes management plan with people in your workplace, school, and household.  Check your urine ketones when you are ill and as told by your health care provider.  Carry a medical alert card or wear medical alert jewelry.  Ask your health care provider:  Do I need to meet with a diabetes educator?  Where can I find a support group for people with diabetes?  Keep all follow-up visits during your pregnancy (prenatal) and after delivery (postnatal) as told by your health care provider. This is important. Where to find more information: For more information about diabetes, visit:  American Diabetes Association (ADA):  www.diabetes.org  American Association of Diabetes Educators (AADE): https://www.diabeteseducator.org/patient-resources This information is not intended to replace advice given to you by your health care provider. Make sure you discuss any questions you have with your health care provider. Document Released: 03/16/2016 Document Revised: 04/30/2016 Document Reviewed: 12/27/2015 Elsevier Interactive Patient Education  2017 [employer].

## 2016-11-16 NOTE — MAU Note (Addendum)
Pt reports cramping and pressure for the last couple of days, but is getting worse today. Denies LOF or Bleeding/ + FM. Vomited once yesterday, but not today. Pt states normal po intake today. Pt felt dizziness around 5 pm and started to fall. She caught herself on the table and wall. Pt last took blood sugar after breakfast it was 110 per pt report.

## 2016-11-19 ENCOUNTER — Encounter: Payer: Self-pay | Admitting: Obstetrics

## 2016-12-07 NOTE — L&D Delivery Note (Signed)
Delivery Note At 6:11 PM a viable female was delivered via Vaginal, 2 min shoulder dystocia that did not respond to McRoberts or superpubic pressure. I was able to reach in get my left index finger under the right armpit and release the trapped shoulder.At that time there was spont del of rest of infant. Delivery (Presentation vertex: ;LOA  ).  APGAR:1 ,9 ; weight  .   Placenta status:spont , via shultz.  Cord:3vc  with the following complications: mod shoulder dystocia.  Cord pH: 7.21  Anesthesia:  epidural Episiotomy: None Lacerations:  none Suture Repair: none Est. Blood Loss 200(mL):    Mom to postpartum.  Baby to Couplet care / Skin to Skin.  Cindy Bruce 01/24/2017, 6:25 PM

## 2016-12-08 ENCOUNTER — Ambulatory Visit (HOSPITAL_COMMUNITY)
Admission: RE | Admit: 2016-12-08 | Discharge: 2016-12-08 | Disposition: A | Discharge status: Home / Self Care | Payer: Medicaid Other | Source: Ambulatory Visit | Attending: Obstetrics and Gynecology | Admitting: Obstetrics and Gynecology

## 2016-12-15 ENCOUNTER — Ambulatory Visit (HOSPITAL_COMMUNITY)
Admission: RE | Admit: 2016-12-15 | Discharge: 2016-12-15 | Disposition: A | Discharge status: Home / Self Care | Payer: Medicaid Other | Source: Ambulatory Visit | Attending: Obstetrics and Gynecology | Admitting: Obstetrics and Gynecology

## 2016-12-15 ENCOUNTER — Encounter (HOSPITAL_COMMUNITY): Payer: Self-pay

## 2016-12-15 ENCOUNTER — Other Ambulatory Visit (HOSPITAL_COMMUNITY): Payer: Self-pay | Admitting: Maternal and Fetal Medicine

## 2016-12-15 ENCOUNTER — Other Ambulatory Visit (HOSPITAL_COMMUNITY): Payer: Self-pay | Admitting: *Deleted

## 2016-12-15 DIAGNOSIS — IMO0001 Reserved for inherently not codable concepts without codable children: Secondary | ICD-10-CM

## 2016-12-15 DIAGNOSIS — O0990 Supervision of high risk pregnancy, unspecified, unspecified trimester: Secondary | ICD-10-CM

## 2016-12-15 DIAGNOSIS — Z3A33 33 weeks gestation of pregnancy: Secondary | ICD-10-CM

## 2016-12-15 DIAGNOSIS — J45909 Unspecified asthma, uncomplicated: Secondary | ICD-10-CM

## 2016-12-15 DIAGNOSIS — Z3689 Encounter for other specified antenatal screening: Secondary | ICD-10-CM

## 2016-12-15 DIAGNOSIS — O99513 Diseases of the respiratory system complicating pregnancy, third trimester: Secondary | ICD-10-CM | POA: Diagnosis not present

## 2016-12-15 DIAGNOSIS — O24113 Pre-existing diabetes mellitus, type 2, in pregnancy, third trimester: Secondary | ICD-10-CM | POA: Insufficient documentation

## 2016-12-15 DIAGNOSIS — O99519 Diseases of the respiratory system complicating pregnancy, unspecified trimester: Secondary | ICD-10-CM

## 2016-12-15 DIAGNOSIS — O24313 Unspecified pre-existing diabetes mellitus in pregnancy, third trimester: Principal | ICD-10-CM

## 2016-12-15 DIAGNOSIS — O24119 Pre-existing diabetes mellitus, type 2, in pregnancy, unspecified trimester: Secondary | ICD-10-CM

## 2016-12-15 DIAGNOSIS — Z794 Long term (current) use of insulin: Principal | ICD-10-CM

## 2016-12-21 ENCOUNTER — Encounter: Payer: Self-pay | Admitting: Obstetrics

## 2016-12-22 ENCOUNTER — Encounter (HOSPITAL_COMMUNITY): Payer: Self-pay

## 2016-12-22 ENCOUNTER — Inpatient Hospital Stay (HOSPITAL_COMMUNITY)
Admission: AD | Admit: 2016-12-22 | Discharge: 2016-12-23 | Disposition: A | Discharge status: Home / Self Care | Payer: Medicaid Other | Source: Ambulatory Visit | Attending: Obstetrics and Gynecology | Admitting: Obstetrics and Gynecology

## 2016-12-22 DIAGNOSIS — O99613 Diseases of the digestive system complicating pregnancy, third trimester: Secondary | ICD-10-CM | POA: Diagnosis not present

## 2016-12-22 DIAGNOSIS — O24113 Pre-existing diabetes mellitus, type 2, in pregnancy, third trimester: Secondary | ICD-10-CM | POA: Diagnosis not present

## 2016-12-22 DIAGNOSIS — E119 Type 2 diabetes mellitus without complications: Secondary | ICD-10-CM | POA: Insufficient documentation

## 2016-12-22 DIAGNOSIS — A084 Viral intestinal infection, unspecified: Secondary | ICD-10-CM | POA: Diagnosis not present

## 2016-12-22 DIAGNOSIS — Z794 Long term (current) use of insulin: Secondary | ICD-10-CM | POA: Insufficient documentation

## 2016-12-22 DIAGNOSIS — Z87891 Personal history of nicotine dependence: Secondary | ICD-10-CM | POA: Insufficient documentation

## 2016-12-22 DIAGNOSIS — Z3A34 34 weeks gestation of pregnancy: Secondary | ICD-10-CM | POA: Diagnosis not present

## 2016-12-22 DIAGNOSIS — O0930 Supervision of pregnancy with insufficient antenatal care, unspecified trimester: Secondary | ICD-10-CM

## 2016-12-22 DIAGNOSIS — R197 Diarrhea, unspecified: Secondary | ICD-10-CM | POA: Diagnosis present

## 2016-12-22 LAB — CBC WITH DIFFERENTIAL/PLATELET
BASOS ABS: 0 10*3/uL (ref 0.0–0.1)
Basophils Relative: 0 %
EOS ABS: 0 10*3/uL (ref 0.0–0.7)
Eosinophils Relative: 0 %
HEMATOCRIT: 31.9 % — AB (ref 36.0–46.0)
HEMOGLOBIN: 10.9 g/dL — AB (ref 12.0–15.0)
LYMPHS PCT: 10 %
Lymphs Abs: 1 10*3/uL (ref 0.7–4.0)
MCH: 25.5 pg — ABNORMAL LOW (ref 26.0–34.0)
MCHC: 34.2 g/dL (ref 30.0–36.0)
MCV: 74.5 fL — ABNORMAL LOW (ref 78.0–100.0)
Monocytes Absolute: 0.4 10*3/uL (ref 0.1–1.0)
Monocytes Relative: 4 %
NEUTROS PCT: 86 %
Neutro Abs: 8.5 10*3/uL — ABNORMAL HIGH (ref 1.7–7.7)
Platelets: 292 10*3/uL (ref 150–400)
RBC: 4.28 MIL/uL (ref 3.87–5.11)
RDW: 14.8 % (ref 11.5–15.5)
WBC: 9.9 10*3/uL (ref 4.0–10.5)

## 2016-12-22 LAB — URINALYSIS, ROUTINE W REFLEX MICROSCOPIC
BACTERIA UA: NONE SEEN
BILIRUBIN URINE: NEGATIVE
Glucose, UA: >=500 mg/dL — AB
KETONES UR: 20 mg/dL — AB
Nitrite: NEGATIVE
PROTEIN: 100 mg/dL — AB
Specific Gravity, Urine: 1.028 (ref 1.005–1.030)
pH: 5 (ref 5.0–8.0)

## 2016-12-22 LAB — BASIC METABOLIC PANEL
ANION GAP: 11 (ref 5–15)
BUN: 9 mg/dL (ref 6–20)
CALCIUM: 8.3 mg/dL — AB (ref 8.9–10.3)
CO2: 18 mmol/L — AB (ref 22–32)
Chloride: 103 mmol/L (ref 101–111)
Creatinine, Ser: 0.56 mg/dL (ref 0.44–1.00)
Glucose, Bld: 197 mg/dL — ABNORMAL HIGH (ref 65–99)
Potassium: 3.4 mmol/L — ABNORMAL LOW (ref 3.5–5.1)
Sodium: 132 mmol/L — ABNORMAL LOW (ref 135–145)

## 2016-12-22 LAB — INFLUENZA PANEL BY PCR (TYPE A & B)
INFLBPCR: NEGATIVE
Influenza A By PCR: NEGATIVE

## 2016-12-22 MED ORDER — PROMETHAZINE HCL 25 MG/ML IJ SOLN
25.0000 mg | Freq: Four times a day (QID) | INTRAMUSCULAR | Status: DC | PRN
  Administered 2016-12-22: 25 mg via INTRAVENOUS
  Filled 2016-12-22: qty 1

## 2016-12-22 MED ORDER — ACETAMINOPHEN 325 MG PO TABS
650.0000 mg | ORAL_TABLET | Freq: Four times a day (QID) | ORAL | Status: DC | PRN
  Administered 2016-12-22: 650 mg via ORAL
  Filled 2016-12-22: qty 2

## 2016-12-22 MED ORDER — SODIUM CHLORIDE 0.9 % IV BOLUS (SEPSIS)
1000.0000 mL | Freq: Once | INTRAVENOUS | Status: AC
  Administered 2016-12-22: 1000 mL via INTRAVENOUS

## 2016-12-22 NOTE — MAU Note (Signed)
Patient presents with c/o N/V/D since last night. Patient states that she is also having upper abdominal pain that started last night and braxton hick ctxs. Patient denies and bleeding or LOF. Fetus active.

## 2016-12-22 NOTE — MAU Note (Signed)
Notified provider that patient is here with N/V/D since last night.

## 2016-12-22 NOTE — MAU Provider Note (Signed)
Chief Complaint:  Contractions and Emesis During Pregnancy   HPI: Cindy Bruce is a 26 y.o. [phone] at 4923w3d who presents to maternity admissions reporting diarrhea and emesis for one day with decreased PO intake since last night. She has not had any sick contacts and has not eaten any foods out of the ordinary. No one else in her family is sick.  Denies any blood in vomit or stool.  First she had abdominal pain, then vomiting and then diarrhea. Diarrhea is watery.  Endorses chills at home throughout the day and body aches. Reports contractions 7 min and denies leakage of fluid or vaginal bleeding. Good fetal movement.    Pregnancy Course:   Past Medical History: Past Medical History:  Diagnosis Date  . Asthma   . Diabetes mellitus    Type 2 since 1st pg in 2008 - on insulin    Past obstetric history: OB History  Gravida Para Term Preterm AB Living  4 3 3  0 0 3  SAB TAB Ectopic Multiple Live Births  0 0 0 0 3    # Outcome Date GA Lbr Len/2nd Weight Sex Delivery Anes PTL Lv  4 Current           3 Term 02/26/14 2855w3d 08:51 / 00:40 3.067 kg (6 lb 12.2 oz) M Vag-Spont EPI  LIV  2 Term 01/06/13 7975w2d 12:38 / 00:21 2.483 kg (5 lb 7.6 oz) M Vag-Spont EPI  LIV     Birth Comments: Mother denies complication during delivery  1 Term 10/16/07 2370w0d  2.92 kg (6 lb 7 oz) F Vag-Spont EPI N LIV      Past Surgical History: Past Surgical History:  Procedure Laterality Date  . TOOTH EXTRACTION       Family History: Family History  Problem Relation Age of Onset  . Diabetes Mother   . Diabetes Maternal Grandmother     Social History: Social History  Substance Use Topics  . Smoking status: Former Smoker    Packs/day: 1.00    Types: Cigarettes  . Smokeless tobacco: Former NeurosurgeonUser  . Alcohol use No     Comment: occ    Allergies:  Allergies  Allergen Reactions  . Food Anaphylaxis    cantaloupe  . 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    I have reviewed patient's Past Medical Hx, Surgical Hx, Family Hx, Social Hx, medications and allergies.   ROS:  A comprehensive ROS was negative except per HPI.    Physical Exam   Patient Vitals for the past 24 hrs:  BP Temp Temp src Pulse Resp SpO2  12/23/16 0008 116/79 - - 107 18 -  12/22/16 2131 - 98.4 F (36.9 C) Oral - - -  12/22/16 2127 123/70 - - (!) 126 18 100 %   Constitutional: Well-developed, well-nourished female in no acute distress.  Cardiovascular: RRR, no murmurs Respiratory: NWOB, no wheezing or rhonchi GI: Abd soft, non-tender, gravid appropriate for gestational age.  MS: Extremities nontender, no edema, normal ROM Neurologic: Alert and oriented x3 GU: normal external female genitalia, cervix: closed, thick and high  Dilation: Closed Effacement (%): Thick Station: -3 Exam by:: Joellyn HaffKim Delphia Kaylor CNM  FHT:  Baseline 150s , moderate variability, accelerations present, no decelerations Contractions: none   Labs: Results for orders placed or performed during the hospital encounter of 12/22/16 (from the past 24 hour(s))  Urinalysis, Routine w reflex microscopic     Status: Abnormal  Collection Time: 12/22/16  9:13 PM  Result Value Ref Range   Color, Urine YELLOW YELLOW   APPearance HAZY (A) CLEAR   Specific Gravity, Urine 1.028 1.005 - 1.030   pH 5.0 5.0 - 8.0   Glucose, UA >=500 (A) NEGATIVE mg/dL   Hgb urine dipstick MODERATE (A) NEGATIVE   Bilirubin Urine NEGATIVE NEGATIVE   Ketones, ur 20 (A) NEGATIVE mg/dL   Protein, ur 161 (A) NEGATIVE mg/dL   Nitrite NEGATIVE NEGATIVE   Leukocytes, UA SMALL (A) NEGATIVE   RBC / HPF 6-30 0 - 5 RBC/hpf   WBC, UA 6-30 0 - 5 WBC/hpf   Bacteria, UA NONE SEEN NONE SEEN   Squamous Epithelial / LPF 6-30 (A) NONE SEEN   Mucous PRESENT   Basic metabolic panel     Status: Abnormal   Collection Time: 12/22/16 10:40 PM  Result Value Ref Range   Sodium 132 (L) 135 - 145 mmol/L   Potassium 3.4 (L) 3.5 - 5.1 mmol/L    Chloride 103 101 - 111 mmol/L   CO2 18 (L) 22 - 32 mmol/L   Glucose, Bld 197 (H) 65 - 99 mg/dL   BUN 9 6 - 20 mg/dL   Creatinine, Ser 0.96 0.44 - 1.00 mg/dL   Calcium 8.3 (L) 8.9 - 10.3 mg/dL   GFR calc non Af Amer >60 >60 mL/min   GFR calc Af Amer >60 >60 mL/min   Anion gap 11 5 - 15  CBC with Differential/Platelet     Status: Abnormal   Collection Time: 12/22/16 10:40 PM  Result Value Ref Range   WBC 9.9 4.0 - 10.5 K/uL   RBC 4.28 3.87 - 5.11 MIL/uL   Hemoglobin 10.9 (L) 12.0 - 15.0 g/dL   HCT 04.5 (L) 40.9 - 81.1 %   MCV 74.5 (L) 78.0 - 100.0 fL   MCH 25.5 (L) 26.0 - 34.0 pg   MCHC 34.2 30.0 - 36.0 g/dL   RDW 91.4 78.2 - 95.6 %   Platelets 292 150 - 400 K/uL   Neutrophils Relative % 86 %   Lymphocytes Relative 10 %   Monocytes Relative 4 %   Eosinophils Relative 0 %   Basophils Relative 0 %   Neutro Abs 8.5 (H) 1.7 - 7.7 K/uL   Lymphs Abs 1.0 0.7 - 4.0 K/uL   Monocytes Absolute 0.4 0.1 - 1.0 K/uL   Eosinophils Absolute 0.0 0.0 - 0.7 K/uL   Basophils Absolute 0.0 0.0 - 0.1 K/uL  Influenza panel by PCR (type A & B)     Status: None   Collection Time: 12/22/16 10:40 PM  Result Value Ref Range   Influenza A By PCR NEGATIVE NEGATIVE   Influenza B By PCR NEGATIVE NEGATIVE    MAU Course: IVF- 1L NS bolus Flu-swab- negative CBC/BMP: no leukocytosis, BMP showed hyponatremia/hypokelamia consistent with N/V/D and poor PO intake Cervical check- closed, thick and high UA and reflex culture: contaminated UA, but with signs of poor PO intake 2/2 vomiting/diarrhea. Culture pending   MDM: Plan of care reviewed with patient, including labs and tests ordered and medical treatment.   Assessment: 1. Viral gastroenteritis   2. [redacted] weeks gestation of pregnancy   Presentation of one day of N/V/D without fevers or other systemic signs/symptoms of illness, consistent with viral gastroenteritis. Received 1L bolus in MAU and phenergan with some relief.  Exam is otherwise unremarkable.   This should be self limiting.  Discussed return precautions with patient and encouraged her to  call her OB tomorrow to schedule follow up appointment.  Recommended plenty of fluids and rest.   Plan: Discharge home in stable condition with phenergan to help with nausea and recommended plenty of fluids and rest.  Preterm labor precautions and fetal kick counts  Follow-up Information    [facility] Follow up.   Specialty:  Obstetrics and Gynecology Why:  Call to schedule your next appointment asap Contact information: [address], [address] [place] [address] [phone]          Allergies as of 12/23/2016      Reactions   Food Anaphylaxis   cantaloupe   Watermelon [citrullus Vulgaris] Anaphylaxis      Medication List    TAKE these medications   acetaminophen 325 MG tablet Commonly known as:  TYLENOL Take 650 mg by mouth every 6 (six) hours as needed for mild pain or headache.   aspirin EC 81 MG tablet Take 1 tablet (81 mg total) by mouth daily.   insulin NPH Human 100 UNIT/ML injection Commonly known as:  HUMULIN N,NOVOLIN N 18 units subcutaneous in morning 12 units subcutaneous in evening What changed:  how much to take  how to take this  when to take this  additional instructions   insulin regular 250 units/2.16mL (100 units/mL) injection Commonly known as:  HUMULIN R 12 units subcutaneous in morning 12 units subcutaneous in evening What changed:  how much to take  how to take this  when to take this  additional instructions   prenatal multivitamin Tabs tablet Take 1 tablet by mouth daily at 12 noon.   promethazine 12.5 MG tablet Commonly known as:  PHENERGAN Take 1 tablet (12.5 mg total) by mouth every 6 (six) hours as needed for nausea or vomiting.       Renne Musca, MD PGY-1 12/23/2016 1:12 AM  I spoke with and examined patient and agree with resident/PA/SNM's note and plan of care.  [redacted]w[redacted]d [id number] w/  n/v/d and uc's. Partner now also has same sx. Class B DM on insulin, no care since 11/21 @ 26wks. States she needs to call to schedule appt.  Given 1L bolus and phenergan w/ improvement in uc's and n/v, pt states she's feeling better.  Cx long & closed D/C'd home w/ phenergan rx, can take imodium if needed for diarrhea, stay well hydrated Call in am to make appt at [facility] clinic ASAP! Note also sent to [facility] to help facilitate.  Reviewed ptl s/s, fkc, reasons to return  Cheral Marker, CNM, [facility] 12/23/2016 4:24 AM

## 2016-12-23 DIAGNOSIS — A084 Viral intestinal infection, unspecified: Secondary | ICD-10-CM

## 2016-12-23 DIAGNOSIS — O0930 Supervision of pregnancy with insufficient antenatal care, unspecified trimester: Secondary | ICD-10-CM

## 2016-12-23 MED ORDER — PROMETHAZINE HCL 12.5 MG PO TABS: 12.5000 mg | ORAL_TABLET | Freq: Four times a day (QID) | ORAL | 0 refills | Status: DC | PRN

## 2016-12-23 NOTE — Discharge Instructions (Signed)
Call the office [phone]) or go to [facility] if:  You begin to have strong, frequent contractions  Your water breaks.  Sometimes it is a big gush of fluid, sometimes it is just a trickle that keeps getting your panties wet or running down your legs  You have vaginal bleeding.  It is normal to have a small amount of spotting if your cervix was checked.   You don't feel your baby moving like normal.  If you don't, get you something to eat and drink and lay down and focus on feeling your baby move.  You should feel at least 10 movements in 2 hours.  If you don't, you should call the office or go to [facility].    Stay well hydrated Imodium if needed for diarrhea  Norovirus Infection A norovirus infection is caused by exposure to a virus in a group of similar viruses (noroviruses). This type of infection causes inflammation in your stomach and intestines (gastroenteritis). Norovirus is the most common cause of gastroenteritis. It also causes food poisoning. Anyone can get a norovirus infection. It spreads very easily (contagious). You can get it from contaminated food, water, surfaces, or other people. Norovirus is found in the stool or vomit of infected people. You can spread the infection as soon as you feel sick until 2 weeks after you recover.  Symptoms usually begin within 2 days after you become infected. Most norovirus symptoms affect the digestive system. CAUSES Norovirus infection is caused by contact with norovirus. You can catch norovirus if you:  Eat or drink something contaminated with norovirus.  Touch surfaces or objects contaminated with norovirus and then put your hand in your mouth.  Have direct contact with an infected person who has symptoms.  Share food, drink, or utensils with someone with who is sick with norovirus. SIGNS AND SYMPTOMS Symptoms of norovirus may include:  Nausea.  Vomiting.  Diarrhea.  Stomach  cramps.  Fever.  Chills.  Headache.  Muscle aches.  Tiredness. DIAGNOSIS Your health care provider may suspect norovirus based on your symptoms and physical exam. Your health care provider may also test a sample of your stool or vomit for the virus.  TREATMENT There is no specific treatment for norovirus. Most people get better without treatment in about 2 days. HOME CARE INSTRUCTIONS  Replace lost fluids by drinking plenty of water or rehydration fluids containing important minerals called electrolytes. This prevents dehydration. Drink enough fluid to keep your urine clear or pale yellow.  Do not prepare food for others while you are infected. Wait at least 3 days after recovering from the illness to do that. PREVENTION   Wash your hands often, especially after using the toilet or changing a diaper.  Wash fruits and vegetables thoroughly before preparing or serving them.  Throw out any food that a sick person may have touched.  Disinfect contaminated surfaces immediately after someone in the household has been sick. Use a bleach-based household cleaner.  Immediately remove and wash soiled clothes or sheets. SEEK MEDICAL CARE IF:  Your vomiting, diarrhea, and stomach pain is getting worse.  Your symptoms of norovirus do not go away after 2-3 days. SEEK IMMEDIATE MEDICAL CARE IF:  You develop symptoms of dehydration that do not improve with fluid replacement. This may include:  Excessive sleepiness.  Lack of tears.  Dry mouth.  Dizziness when standing.  Weak pulse. This information is not intended to replace advice given to you by your health care provider. Make sure  you discuss any questions you have with your health care provider. Document Released: 02/13/2003 Document Revised: 12/14/2014 Document Reviewed: 05/03/2014 Elsevier Interactive Patient Education  2017 [employer].

## 2016-12-25 ENCOUNTER — Telehealth: Payer: Self-pay | Admitting: *Deleted

## 2016-12-25 NOTE — Telephone Encounter (Signed)
Left message with patient.  Trying to schedule a follow up appointment with her. She has had a lapse in care and needs to be seen soon.

## 2016-12-25 NOTE — Telephone Encounter (Signed)
Left message on voice mail of appointment scheduled for Monday 12/28/16 and to bring meter with her to this appointment.

## 2016-12-28 ENCOUNTER — Encounter: Payer: Self-pay | Admitting: Obstetrics and Gynecology

## 2017-01-06 ENCOUNTER — Other Ambulatory Visit (HOSPITAL_COMMUNITY)
Admission: RE | Admit: 2017-01-06 | Discharge: 2017-01-06 | Disposition: A | Discharge status: Home / Self Care | Payer: Medicaid Other | Source: Ambulatory Visit | Attending: Obstetrics and Gynecology | Admitting: Obstetrics and Gynecology

## 2017-01-06 ENCOUNTER — Ambulatory Visit (INDEPENDENT_AMBULATORY_CARE_PROVIDER_SITE_OTHER): Payer: Medicaid Other | Admitting: Obstetrics and Gynecology

## 2017-01-06 VITALS — BP 121/76 | HR 106 | Wt 176.0 lb

## 2017-01-06 DIAGNOSIS — Z283 Underimmunization status: Secondary | ICD-10-CM

## 2017-01-06 DIAGNOSIS — Z6791 Unspecified blood type, Rh negative: Secondary | ICD-10-CM

## 2017-01-06 DIAGNOSIS — Z3490 Encounter for supervision of normal pregnancy, unspecified, unspecified trimester: Secondary | ICD-10-CM

## 2017-01-06 DIAGNOSIS — O09899 Supervision of other high risk pregnancies, unspecified trimester: Secondary | ICD-10-CM

## 2017-01-06 DIAGNOSIS — O0990 Supervision of high risk pregnancy, unspecified, unspecified trimester: Secondary | ICD-10-CM

## 2017-01-06 DIAGNOSIS — O24913 Unspecified diabetes mellitus in pregnancy, third trimester: Secondary | ICD-10-CM

## 2017-01-06 DIAGNOSIS — Z113 Encounter for screening for infections with a predominantly sexual mode of transmission: Secondary | ICD-10-CM | POA: Insufficient documentation

## 2017-01-06 DIAGNOSIS — O24919 Unspecified diabetes mellitus in pregnancy, unspecified trimester: Secondary | ICD-10-CM

## 2017-01-06 DIAGNOSIS — O0933 Supervision of pregnancy with insufficient antenatal care, third trimester: Secondary | ICD-10-CM

## 2017-01-06 DIAGNOSIS — O26899 Other specified pregnancy related conditions, unspecified trimester: Secondary | ICD-10-CM

## 2017-01-06 DIAGNOSIS — O0930 Supervision of pregnancy with insufficient antenatal care, unspecified trimester: Secondary | ICD-10-CM

## 2017-01-06 DIAGNOSIS — O9989 Other specified diseases and conditions complicating pregnancy, childbirth and the puerperium: Secondary | ICD-10-CM

## 2017-01-06 LAB — OB RESULTS CONSOLE GC/CHLAMYDIA: GC PROBE AMP, GENITAL: NEGATIVE

## 2017-01-06 NOTE — Progress Notes (Signed)
Subjective:  Cindy Bruce is a 26 y.o. [id number] at 3177w4d being seen today for ongoing prenatal care.  She is currently monitored for the following issues for this high-risk pregnancy and has Rubella non-immune status, antepartum; Rh negative state in antepartum period; Diabetes mellitus complicating pregnancy, antepartum; Supervision of high risk pregnancy, antepartum; and Limited prenatal care, antepartum on her problem list.  Patient reports no complaints.  Contractions: Regular. Vag. Bleeding: None.  Movement: Present. Denies leaking of fluid.   The following portions of the patient's history were reviewed and updated as appropriate: allergies, current medications, past family history, past medical history, past social history, past surgical history and problem list. Problem list updated.  Objective:   Vitals:   01/06/17 1410  BP: 121/76  Pulse: (!) 106  Weight: 176 lb (79.8 kg)    Fetal Status: Fetal Heart Rate (bpm): 132   Movement: Present     General:  Alert, oriented and cooperative. Patient is in no acute distress.  Skin: Skin is warm and dry. No rash noted.   Cardiovascular: Normal heart rate noted  Respiratory: Normal respiratory effort, no problems with respiration noted  Abdomen: Soft, gravid, appropriate for gestational age. Pain/Pressure: Present     Pelvic:  Cervical exam performed        Extremities: Normal range of motion.  Edema: Trace  Mental Status: Normal mood and affect. Normal behavior. Normal judgment and thought content.   Urinalysis:      Assessment and Plan:  Pregnancy: G4P3003 at 5877w4d  1. Encounter for supervision of normal pregnancy, antepartum, unspecified gravidity Pt has had limited PNC d/t transportation issues. States this has been resolved - Strep Gp B NAA - GC/Chlamydia probe amp ([place])not at [facility]  2. Diabetes mellitus complicating pregnancy, antepartum Fasting 110's, 2 hr PP 140-150's Will increase insulin 4 units across the  board Reorder Fetal ECHO Start twice weekly testing U/S 1/9 EFW 2234 gms, 62% F/U growth scan ordered BTL papers signed today, pt aware will have to be interval tubal d/t 30 day rule IOL at 39 weeks 3. Rubella non-immune status, antepartum Vaccine PP  4. Limited prenatal care, antepartum  5. Supervision of high risk pregnancy, antepartum   6. Rh negative state in antepartum period Antibody screen today  Preterm labor symptoms and general obstetric precautions including but not limited to vaginal bleeding, contractions, leaking of fluid and fetal movement were reviewed in detail with the patient. Please refer to After Visit Summary for other counseling recommendations.  No Follow-up on file.   Hermina StaggersMichael L Harvey Lingo, MD

## 2017-01-06 NOTE — Addendum Note (Signed)
Addended by: Hermina StaggersERVIN, MICHAEL L on: 01/06/2017 03:24 PM   Modules accepted: Orders

## 2017-01-07 LAB — GC/CHLAMYDIA PROBE AMP (~~LOC~~) NOT AT ARMC
CHLAMYDIA, DNA PROBE: NEGATIVE
NEISSERIA GONORRHEA: NEGATIVE

## 2017-01-08 LAB — HEMOGLOBIN A1C
Est. average glucose Bld gHb Est-mCnc: 171 mg/dL
Hgb A1c MFr Bld: 7.6 % — ABNORMAL HIGH (ref 4.8–5.6)

## 2017-01-08 LAB — AB SCR+ANTIBODY ID: Antibody Screen: POSITIVE — AB

## 2017-01-08 LAB — ANTIBODY SCREEN

## 2017-01-08 LAB — STREP GP B NAA: STREP GROUP B AG: POSITIVE — AB

## 2017-01-11 ENCOUNTER — Ambulatory Visit (INDEPENDENT_AMBULATORY_CARE_PROVIDER_SITE_OTHER): Payer: Medicaid Other | Admitting: Certified Nurse Midwife

## 2017-01-11 VITALS — BP 110/72 | HR 96 | Wt 180.0 lb

## 2017-01-11 DIAGNOSIS — O24913 Unspecified diabetes mellitus in pregnancy, third trimester: Secondary | ICD-10-CM | POA: Diagnosis not present

## 2017-01-11 DIAGNOSIS — O24919 Unspecified diabetes mellitus in pregnancy, unspecified trimester: Secondary | ICD-10-CM

## 2017-01-11 DIAGNOSIS — O0990 Supervision of high risk pregnancy, unspecified, unspecified trimester: Secondary | ICD-10-CM

## 2017-01-11 NOTE — Progress Notes (Signed)
Patient is here for NST. She was placed on machine.

## 2017-01-12 ENCOUNTER — Ambulatory Visit (HOSPITAL_COMMUNITY)
Admission: RE | Admit: 2017-01-12 | Discharge: 2017-01-12 | Disposition: A | Discharge status: Home / Self Care | Payer: Medicaid Other | Source: Ambulatory Visit | Attending: Obstetrics and Gynecology | Admitting: Obstetrics and Gynecology

## 2017-01-12 ENCOUNTER — Encounter (HOSPITAL_COMMUNITY): Payer: Self-pay

## 2017-01-12 NOTE — Progress Notes (Signed)
O: NST: + accels, no decels, moderate variability, Cat. 1 tracing. No contractions on toco.  A: reactive NST     DM P: continue monitoring as prescribed.  R.Ruby Dilone CNM

## 2017-01-14 ENCOUNTER — Encounter: Payer: Self-pay | Admitting: Obstetrics & Gynecology

## 2017-01-17 ENCOUNTER — Inpatient Hospital Stay (HOSPITAL_COMMUNITY)
Admission: AD | Admit: 2017-01-17 | Discharge: 2017-01-17 | Disposition: A | Discharge status: Home / Self Care | Payer: Medicaid Other | Source: Ambulatory Visit | Attending: Obstetrics & Gynecology | Admitting: Obstetrics & Gynecology

## 2017-01-17 DIAGNOSIS — Z3A38 38 weeks gestation of pregnancy: Secondary | ICD-10-CM | POA: Insufficient documentation

## 2017-01-17 DIAGNOSIS — R0602 Shortness of breath: Secondary | ICD-10-CM | POA: Diagnosis present

## 2017-01-17 DIAGNOSIS — A5901 Trichomonal vulvovaginitis: Secondary | ICD-10-CM | POA: Diagnosis not present

## 2017-01-17 DIAGNOSIS — O479 False labor, unspecified: Secondary | ICD-10-CM

## 2017-01-17 DIAGNOSIS — O24113 Pre-existing diabetes mellitus, type 2, in pregnancy, third trimester: Secondary | ICD-10-CM | POA: Insufficient documentation

## 2017-01-17 DIAGNOSIS — Z794 Long term (current) use of insulin: Secondary | ICD-10-CM | POA: Diagnosis not present

## 2017-01-17 DIAGNOSIS — E119 Type 2 diabetes mellitus without complications: Secondary | ICD-10-CM | POA: Insufficient documentation

## 2017-01-17 DIAGNOSIS — O471 False labor at or after 37 completed weeks of gestation: Secondary | ICD-10-CM | POA: Diagnosis not present

## 2017-01-17 DIAGNOSIS — Z87891 Personal history of nicotine dependence: Secondary | ICD-10-CM | POA: Insufficient documentation

## 2017-01-17 DIAGNOSIS — O98313 Other infections with a predominantly sexual mode of transmission complicating pregnancy, third trimester: Secondary | ICD-10-CM | POA: Insufficient documentation

## 2017-01-17 DIAGNOSIS — A599 Trichomoniasis, unspecified: Secondary | ICD-10-CM

## 2017-01-17 LAB — URINALYSIS, ROUTINE W REFLEX MICROSCOPIC
BACTERIA UA: NONE SEEN
BILIRUBIN URINE: NEGATIVE
Glucose, UA: NEGATIVE mg/dL
HGB URINE DIPSTICK: NEGATIVE
Ketones, ur: NEGATIVE mg/dL
Nitrite: NEGATIVE
PROTEIN: NEGATIVE mg/dL
SPECIFIC GRAVITY, URINE: 1.008 (ref 1.005–1.030)
pH: 6 (ref 5.0–8.0)

## 2017-01-17 LAB — AMNISURE RUPTURE OF MEMBRANE (ROM) NOT AT ARMC: AMNISURE: NEGATIVE

## 2017-01-17 LAB — WET PREP, GENITAL
Clue Cells Wet Prep HPF POC: NONE SEEN
Sperm: NONE SEEN
YEAST WET PREP: NONE SEEN

## 2017-01-17 MED ORDER — METRONIDAZOLE 500 MG PO TABS
2000.0000 mg | ORAL_TABLET | Freq: Once | ORAL | Status: AC
  Administered 2017-01-17: 2000 mg via ORAL
  Filled 2017-01-17: qty 4

## 2017-01-17 NOTE — Discharge Instructions (Signed)
Trichomonas Test The trichomonas test is done to diagnose trichomoniasis, an infection caused by an organism called Trichomonas. Trichomoniasis is a sexually transmitted infection (STI). In women, it causes vaginal infections. In men, it can cause the tube that carries urine (urethra) to become inflamed (urethritis). You may have this test as a part of a routine screening for STIs or if you have symptoms of trichomoniasis. To perform the test, your health care provider will take a sample of discharge. The sample is taken from the vagina or cervix in women and from the urethra in men. A urine sample can also be used for testing. RESULTS It is your responsibility to obtain your test results. Ask the lab or department performing the test when and how you will get your results. Contact your health care provider to discuss any questions you have about your results.  Meaning of Negative Test Results A negative test means you do not have trichomoniasis. Follow your health care provider's directions about any follow-up testing.  Meaning of Positive Test Results A positive test result means you have an active infection that needs to be treated with antibiotic medicine. All your current sexual partners must also be treated or it is likely you will get reinfected.  If your test is positive, your health care provider will start you on medicine and may advise you to:  Not have sexual intercourse until your infection has cleared up.  Use a latex condom properly every time you have sexual intercourse.  Limit the number of sexual partners you have. The more partners you have, the greater your risk of contracting trichomoniasis or another STI.  Tell all sexual partners about your infection so they can also be treated and to prevent reinfection. This information is not intended to replace advice given to you by your health care provider. Make sure you discuss any questions you have with your health care  provider. Document Released: 12/26/2004 Document Revised: 12/14/2014 Document Reviewed: 12/05/2013 Elsevier Interactive Patient Education  2017 Elsevier Inc. [employer] CorporationBraxton Hicks Contractions Contractions of the uterus can occur throughout pregnancy. Contractions are not always a sign that you are in labor.  WHAT ARE BRAXTON HICKS CONTRACTIONS?  Contractions that occur before labor are called Braxton Hicks contractions, or false labor. Toward the end of pregnancy (32-34 weeks), these contractions can develop more often and may become more forceful. This is not true labor because these contractions do not result in opening (dilatation) and thinning of the cervix. They are sometimes difficult to tell apart from true labor because these contractions can be forceful and people have different pain tolerances. You should not feel embarrassed if you go to the hospital with false labor. Sometimes, the only way to tell if you are in true labor is for your health care provider to look for changes in the cervix. If there are no prenatal problems or other health problems associated with the pregnancy, it is completely safe to be sent home with false labor and await the onset of true labor. HOW CAN YOU TELL THE DIFFERENCE BETWEEN TRUE AND FALSE LABOR? False Labor   The contractions of false labor are usually shorter and not as hard as those of true labor.   The contractions are usually irregular.   The contractions are often felt in the front of the lower abdomen and in the groin.   The contractions may go away when you walk around or change positions while lying down.   The contractions get weaker and are shorter  lasting as time goes on.   The contractions do not usually become progressively stronger, regular, and closer together as with true labor.  True Labor   Contractions in true labor last 30-70 seconds, become very regular, usually become more intense, and increase in frequency.   The  contractions do not go away with walking.   The discomfort is usually felt in the top of the uterus and spreads to the lower abdomen and low back.   True labor can be determined by your health care provider with an exam. This will show that the cervix is dilating and getting thinner.  WHAT TO REMEMBER  Keep up with your usual exercises and follow other instructions given by your health care provider.   Take medicines as directed by your health care provider.   Keep your regular prenatal appointments.   Eat and drink lightly if you think you are going into labor.   If Braxton Hicks contractions are making you uncomfortable:   Change your position from lying down or resting to walking, or from walking to resting.   Sit and rest in a tub of warm water.   Drink 2-3 glasses of water. Dehydration may cause these contractions.   Do slow and deep breathing several times an hour.  WHEN SHOULD I SEEK IMMEDIATE MEDICAL CARE? Seek immediate medical care if:  Your contractions become stronger, more regular, and closer together.   You have fluid leaking or gushing from your vagina.   You have a fever.   You pass blood-tinged mucus.   You have vaginal bleeding.   You have continuous abdominal pain.   You have low back pain that you never had before.   You feel your baby's head pushing down and causing pelvic pressure.   Your baby is not moving as much as it used to.  This information is not intended to replace advice given to you by your health care provider. Make sure you discuss any questions you have with your health care provider. Document Released: 11/23/2005 Document Revised: 03/16/2016 Document Reviewed: 09/04/2013 Elsevier Interactive Patient Education  2017 [employer].

## 2017-01-17 NOTE — MAU Provider Note (Signed)
History   Patient is a [id number] presents today c/o cough, SOB, and leaking fluids. Also requesting IOL.   CSN: [id number]  Arrival date & time 01/17/17  1024   None     Chief Complaint  Patient presents with  . Abdominal Pain  . Shortness of Breath  . Back Pain    HPI  Past Medical History:  Diagnosis Date  . Asthma   . 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 TOOTH EXTRACTION      Family History  Problem Relation Age of Onset  . Diabetes Mother   . Diabetes Maternal Grandmother     Social History  Substance Use Topics  . Smoking status: Former Smoker    Packs/day: 1.00    Types: Cigarettes  . Smokeless tobacco: Former NeurosurgeonUser  . Alcohol use No     Comment: occ    OB History    Gravida Para Term Preterm AB Living   4 3 3  0 0 3   SAB TAB Ectopic Multiple Live Births   0 0 0 0 3      Review of Systems  Constitutional: Negative.   HENT: Negative.   Eyes: Negative.   Respiratory: Negative.   Cardiovascular: Negative.   Gastrointestinal: Negative.   Endocrine: Negative.   Genitourinary: Negative.   Musculoskeletal: Negative.   Skin: Negative.   Neurological: Negative.   Hematological: Negative.   Psychiatric/Behavioral: Negative.     Allergies  Food and Watermelon [citrullus vulgaris]  Home Medications    BP 143/90   Pulse 104   Temp 98.4 F (36.9 C)   Resp 16   Ht 5\' 2"  (1.575 m)   Wt 180 lb (81.6 kg)   LMP 04/20/2016 (Approximate)   SpO2 100%   BMI 32.92 kg/m   Physical Exam  Constitutional: She is oriented to person, place, and time. She appears well-developed and well-nourished.  HENT:  Head: Normocephalic.  Cardiovascular: Normal rate, regular rhythm and normal heart sounds.   Pulmonary/Chest: Effort normal and breath sounds normal.  Abdominal: Soft.  Genitourinary: Vagina normal and uterus normal.  Musculoskeletal: Normal range of motion.   Neurological: She is alert and oriented to person, place, and time.  Skin: Skin is warm and dry.  Psychiatric: She has a normal mood and affect. Her behavior is normal. Judgment and thought content normal.    MAU Course  Procedures (including critical care time)  Labs Reviewed  URINALYSIS, ROUTINE W REFLEX MICROSCOPIC  AMNISURE RUPTURE OF MEMBRANE (ROM) NOT AT [facility]   No results found.   No diagnosis found.    MDM   False labor trich Common discomforts of late pregnancy Plan: SVE 1-2/thick/-4. O2 saturation 100% Respiration is unlabored with clear breath sounds.  Discussed with length with patient criteria for induction of labor. Flagyl 2 gm po. Multiple BP's !20'/80's. Will discharge home with reactive FHR pattern.

## 2017-01-17 NOTE — MAU Note (Signed)
Pt presents to MAU with complaints of lower back and abdominal pain, Pt also reports shortness of breath and pain in the right side of her lung. Denies any vaginal bleeding. Reports increase in vaginal leaking for 2 days

## 2017-01-18 ENCOUNTER — Other Ambulatory Visit: Payer: Self-pay

## 2017-01-21 ENCOUNTER — Ambulatory Visit (INDEPENDENT_AMBULATORY_CARE_PROVIDER_SITE_OTHER): Payer: Medicaid Other | Admitting: Obstetrics & Gynecology

## 2017-01-21 VITALS — BP 132/80 | HR 108 | Wt 183.6 lb

## 2017-01-21 DIAGNOSIS — O24913 Unspecified diabetes mellitus in pregnancy, third trimester: Secondary | ICD-10-CM | POA: Diagnosis not present

## 2017-01-21 DIAGNOSIS — A491 Streptococcal infection, unspecified site: Secondary | ICD-10-CM | POA: Insufficient documentation

## 2017-01-21 DIAGNOSIS — O0990 Supervision of high risk pregnancy, unspecified, unspecified trimester: Secondary | ICD-10-CM

## 2017-01-21 DIAGNOSIS — O24919 Unspecified diabetes mellitus in pregnancy, unspecified trimester: Secondary | ICD-10-CM

## 2017-01-21 NOTE — Progress Notes (Signed)
Patient scheduled for induction 01-23-17 at 11:45 pm. Armandina StammerJennifer Mattia Liford RN

## 2017-01-21 NOTE — Progress Notes (Signed)
Reactive NST   PRENATAL VISIT NOTE  Subjective:  Cindy Bruce is a 26 y.o. [phone] at 7366w5d being seen today for ongoing prenatal care.  She is currently monitored for the following issues for this high-risk pregnancy and has Rubella non-immune status, antepartum; Rh negative state in antepartum period; Diabetes mellitus complicating pregnancy, antepartum; Supervision of high risk pregnancy, antepartum; Limited prenatal care, antepartum; and GBS (group B streptococcus) infection on her problem list.  Patient reports no complaints.  Contractions: Irregular. Vag. Bleeding: None.  Movement: Present. Denies leaking of fluid.   The following portions of the patient's history were reviewed and updated as appropriate: allergies, current medications, past family history, past medical history, past social history, past surgical history and problem list. Problem list updated.  Objective:   Vitals:   01/21/17 1422  BP: 132/80  Pulse: (!) 108  Weight: 183 lb 9.6 oz (83.3 kg)    Fetal Status: Fetal Heart Rate (bpm): NST   Movement: Present  Presentation: Vertex  General:  Alert, oriented and cooperative. Patient is in no acute distress.  Skin: Skin is warm and dry. No rash noted.   Cardiovascular: Normal heart rate noted  Respiratory: Normal respiratory effort, no problems with respiration noted  Abdomen: Soft, gravid, appropriate for gestational age. Pain/Pressure: Present     Pelvic:  Cervical exam deferred        Extremities: Normal range of motion.  Edema: None  Mental Status: Normal mood and affect. Normal behavior. Normal judgment and thought content.   Assessment and Plan:  Pregnancy: G4P3003 at 3166w5d  1. GBS (group B streptococcus) infection ABX in labor  2. Diabetes mellitus complicating pregnancy, antepartum Needs IOL 39 weeks  3. Supervision of high risk pregnancy, antepartum   Term labor symptoms and general obstetric precautions including but not limited to vaginal  bleeding, contractions, leaking of fluid and fetal movement were reviewed in detail with the patient. Please refer to After Visit Summary for other counseling recommendations.  Return in about 6 weeks (around 03/04/2017) for postpartum.   Adam PhenixJames G Sunnie Odden, MD

## 2017-01-21 NOTE — Progress Notes (Signed)
GDM pt presents for NST and ROB c/o recent hospital visit d/t abdominal pain, vomiting, nausea, chest pain, low back pain, pelvic pain, and both leg pain x 1 wk. Pt reports hospital informed her to discuss IOL with provider today. CBG's not available today.

## 2017-01-22 ENCOUNTER — Telehealth (HOSPITAL_COMMUNITY): Payer: Self-pay | Admitting: *Deleted

## 2017-01-22 NOTE — Telephone Encounter (Signed)
Preadmission screen  

## 2017-01-23 ENCOUNTER — Other Ambulatory Visit: Payer: Self-pay | Admitting: Advanced Practice Midwife

## 2017-01-24 ENCOUNTER — Encounter (HOSPITAL_COMMUNITY): Payer: Self-pay

## 2017-01-24 ENCOUNTER — Inpatient Hospital Stay (HOSPITAL_COMMUNITY)
Admission: RE | Admit: 2017-01-24 | Discharge: 2017-01-26 | DRG: 774 | Disposition: A | Discharge status: Home / Self Care | Payer: Medicaid Other | Source: Ambulatory Visit | Attending: Obstetrics & Gynecology | Admitting: Obstetrics & Gynecology

## 2017-01-24 ENCOUNTER — Inpatient Hospital Stay (HOSPITAL_COMMUNITY): Payer: Medicaid Other | Admitting: Anesthesiology

## 2017-01-24 DIAGNOSIS — Z6791 Unspecified blood type, Rh negative: Secondary | ICD-10-CM | POA: Diagnosis not present

## 2017-01-24 DIAGNOSIS — E669 Obesity, unspecified: Secondary | ICD-10-CM | POA: Diagnosis present

## 2017-01-24 DIAGNOSIS — Z8632 Personal history of gestational diabetes: Secondary | ICD-10-CM | POA: Diagnosis present

## 2017-01-24 DIAGNOSIS — Z87891 Personal history of nicotine dependence: Secondary | ICD-10-CM | POA: Diagnosis not present

## 2017-01-24 DIAGNOSIS — O26893 Other specified pregnancy related conditions, third trimester: Secondary | ICD-10-CM | POA: Diagnosis present

## 2017-01-24 DIAGNOSIS — Z794 Long term (current) use of insulin: Secondary | ICD-10-CM | POA: Diagnosis not present

## 2017-01-24 DIAGNOSIS — E119 Type 2 diabetes mellitus without complications: Secondary | ICD-10-CM | POA: Diagnosis present

## 2017-01-24 DIAGNOSIS — O99214 Obesity complicating childbirth: Secondary | ICD-10-CM | POA: Diagnosis present

## 2017-01-24 DIAGNOSIS — O99824 Streptococcus B carrier state complicating childbirth: Secondary | ICD-10-CM | POA: Diagnosis present

## 2017-01-24 DIAGNOSIS — O0990 Supervision of high risk pregnancy, unspecified, unspecified trimester: Secondary | ICD-10-CM

## 2017-01-24 DIAGNOSIS — Z3A39 39 weeks gestation of pregnancy: Secondary | ICD-10-CM | POA: Diagnosis not present

## 2017-01-24 DIAGNOSIS — Z6834 Body mass index (BMI) 34.0-34.9, adult: Secondary | ICD-10-CM | POA: Diagnosis not present

## 2017-01-24 DIAGNOSIS — O24919 Unspecified diabetes mellitus in pregnancy, unspecified trimester: Secondary | ICD-10-CM | POA: Diagnosis present

## 2017-01-24 DIAGNOSIS — O2412 Pre-existing diabetes mellitus, type 2, in childbirth: Secondary | ICD-10-CM | POA: Diagnosis present

## 2017-01-24 DIAGNOSIS — Z833 Family history of diabetes mellitus: Secondary | ICD-10-CM | POA: Diagnosis not present

## 2017-01-24 LAB — GLUCOSE, CAPILLARY
GLUCOSE-CAPILLARY: 101 mg/dL — AB (ref 65–99)
GLUCOSE-CAPILLARY: 119 mg/dL — AB (ref 65–99)
GLUCOSE-CAPILLARY: 133 mg/dL — AB (ref 65–99)
GLUCOSE-CAPILLARY: 149 mg/dL — AB (ref 65–99)
GLUCOSE-CAPILLARY: 157 mg/dL — AB (ref 65–99)
GLUCOSE-CAPILLARY: 229 mg/dL — AB (ref 65–99)
GLUCOSE-CAPILLARY: 92 mg/dL (ref 65–99)
Glucose-Capillary: 124 mg/dL — ABNORMAL HIGH (ref 65–99)
Glucose-Capillary: 125 mg/dL — ABNORMAL HIGH (ref 65–99)
Glucose-Capillary: 115 mg/dL — ABNORMAL HIGH (ref 65–99)
Glucose-Capillary: 96 mg/dL (ref 65–99)
Glucose-Capillary: 119 mg/dL — ABNORMAL HIGH (ref 65–99)
Glucose-Capillary: 108 mg/dL — ABNORMAL HIGH (ref 65–99)
Glucose-Capillary: 159 mg/dL — ABNORMAL HIGH (ref 65–99)
Glucose-Capillary: 162 mg/dL — ABNORMAL HIGH (ref 65–99)
Glucose-Capillary: 128 mg/dL — ABNORMAL HIGH (ref 65–99)
Glucose-Capillary: 98 mg/dL (ref 65–99)
Glucose-Capillary: 82 mg/dL (ref 65–99)
Glucose-Capillary: 148 mg/dL — ABNORMAL HIGH (ref 65–99)
Glucose-Capillary: 110 mg/dL — ABNORMAL HIGH (ref 65–99)

## 2017-01-24 LAB — CBC
HEMATOCRIT: 31.2 % — AB (ref 36.0–46.0)
HEMOGLOBIN: 10.3 g/dL — AB (ref 12.0–15.0)
MCH: 23.5 pg — AB (ref 26.0–34.0)
MCHC: 33 g/dL (ref 30.0–36.0)
MCV: 71.1 fL — ABNORMAL LOW (ref 78.0–100.0)
Platelets: 255 10*3/uL (ref 150–400)
RBC: 4.39 MIL/uL (ref 3.87–5.11)
RDW: 16.2 % — ABNORMAL HIGH (ref 11.5–15.5)
WBC: 7.5 10*3/uL (ref 4.0–10.5)

## 2017-01-24 LAB — TYPE AND SCREEN
ABO/RH(D): O NEG
ANTIBODY SCREEN: NEGATIVE

## 2017-01-24 LAB — RPR: RPR Ser Ql: NONREACTIVE

## 2017-01-24 MED ORDER — FLEET ENEMA 7-19 GM/118ML RE ENEM: 1.0000 | ENEMA | Freq: Every day | RECTAL | Status: DC | PRN

## 2017-01-24 MED ORDER — PHENYLEPHRINE 40 MCG/ML (10ML) SYRINGE FOR IV PUSH (FOR BLOOD PRESSURE SUPPORT)
80.0000 ug | PREFILLED_SYRINGE | INTRAVENOUS | Status: DC | PRN
  Filled 2017-01-24: qty 5
  Filled 2017-01-24: qty 10

## 2017-01-24 MED ORDER — TERBUTALINE SULFATE 1 MG/ML IJ SOLN
0.2500 mg | Freq: Once | INTRAMUSCULAR | Status: DC | PRN
  Filled 2017-01-24: qty 1

## 2017-01-24 MED ORDER — MEASLES, MUMPS & RUBELLA VAC ~~LOC~~ INJ
0.5000 mL | INJECTION | Freq: Once | SUBCUTANEOUS | Status: AC
  Administered 2017-01-26: 0.5 mL via SUBCUTANEOUS
  Filled 2017-01-24: qty 0.5

## 2017-01-24 MED ORDER — OXYTOCIN BOLUS FROM INFUSION: 500.0000 mL | Freq: Once | INTRAVENOUS | Status: DC

## 2017-01-24 MED ORDER — INSULIN ASPART 100 UNIT/ML ~~LOC~~ SOLN
12.0000 [IU] | Freq: Two times a day (BID) | SUBCUTANEOUS | Status: DC
  Administered 2017-01-26: 12 [IU] via SUBCUTANEOUS

## 2017-01-24 MED ORDER — TETANUS-DIPHTH-ACELL PERTUSSIS 5-2.5-18.5 LF-MCG/0.5 IM SUSP: 0.5000 mL | Freq: Once | INTRAMUSCULAR | Status: DC

## 2017-01-24 MED ORDER — PHENYLEPHRINE 40 MCG/ML (10ML) SYRINGE FOR IV PUSH (FOR BLOOD PRESSURE SUPPORT)
80.0000 ug | PREFILLED_SYRINGE | INTRAVENOUS | Status: DC | PRN
  Filled 2017-01-24: qty 5

## 2017-01-24 MED ORDER — DEXTROSE 50 % IV SOLN: 25.0000 mL | INTRAVENOUS | Status: DC | PRN

## 2017-01-24 MED ORDER — FENTANYL 2.5 MCG/ML BUPIVACAINE 1/10 % EPIDURAL INFUSION (WH - ANES)
14.0000 mL/h | INTRAMUSCULAR | Status: DC | PRN
  Administered 2017-01-24: 14 mL/h via EPIDURAL
  Filled 2017-01-24: qty 100

## 2017-01-24 MED ORDER — MISOPROSTOL 25 MCG QUARTER TABLET: 25.0000 ug | ORAL_TABLET | ORAL | Status: DC | PRN

## 2017-01-24 MED ORDER — OXYTOCIN 40 UNITS IN LACTATED RINGERS INFUSION - SIMPLE MED: 2.5000 [IU]/h | INTRAVENOUS | Status: DC

## 2017-01-24 MED ORDER — PENICILLIN G POT IN DEXTROSE 60000 UNIT/ML IV SOLN
3.0000 10*6.[IU] | INTRAVENOUS | Status: DC
  Administered 2017-01-24 (×4): 3 10*6.[IU] via INTRAVENOUS
  Filled 2017-01-24 (×6): qty 50

## 2017-01-24 MED ORDER — COCONUT OIL OIL: 1.0000 "application " | TOPICAL_OIL | Status: DC | PRN

## 2017-01-24 MED ORDER — LACTATED RINGERS IV SOLN: 500.0000 mL | Freq: Once | INTRAVENOUS | Status: DC

## 2017-01-24 MED ORDER — EPHEDRINE 5 MG/ML INJ: 10.0000 mg | INTRAVENOUS | Status: DC | PRN

## 2017-01-24 MED ORDER — ONDANSETRON HCL 4 MG/2ML IJ SOLN: 4.0000 mg | Freq: Four times a day (QID) | INTRAMUSCULAR | Status: DC | PRN

## 2017-01-24 MED ORDER — LIDOCAINE HCL (PF) 1 % IJ SOLN: 30.0000 mL | INTRAMUSCULAR | Status: DC | PRN

## 2017-01-24 MED ORDER — INSULIN NPH (HUMAN) (ISOPHANE) 100 UNIT/ML ~~LOC~~ SUSP
12.0000 [IU] | Freq: Every day | SUBCUTANEOUS | Status: DC
  Administered 2017-01-25: 12 [IU] via SUBCUTANEOUS

## 2017-01-24 MED ORDER — SIMETHICONE 80 MG PO CHEW: 80.0000 mg | CHEWABLE_TABLET | ORAL | Status: DC | PRN

## 2017-01-24 MED ORDER — DEXTROSE-NACL 5-0.45 % IV SOLN
INTRAVENOUS | Status: DC
  Administered 2017-01-24 (×3): via INTRAVENOUS

## 2017-01-24 MED ORDER — DEXTROSE 5 % IV SOLN
5.0000 10*6.[IU] | Freq: Once | INTRAVENOUS | Status: AC
  Administered 2017-01-24: 5 10*6.[IU] via INTRAVENOUS
  Filled 2017-01-24: qty 5

## 2017-01-24 MED ORDER — FENTANYL CITRATE (PF) 100 MCG/2ML IJ SOLN: 100.0000 ug | INTRAMUSCULAR | Status: DC | PRN

## 2017-01-24 MED ORDER — SODIUM CHLORIDE 0.9% FLUSH
3.0000 mL | INTRAVENOUS | Status: DC | PRN
  Administered 2017-01-25: 3 mL via INTRAVENOUS
  Filled 2017-01-24: qty 3

## 2017-01-24 MED ORDER — EPHEDRINE 5 MG/ML INJ
10.0000 mg | INTRAVENOUS | Status: DC | PRN
  Filled 2017-01-24: qty 4

## 2017-01-24 MED ORDER — LACTATED RINGERS IV SOLN
500.0000 mL | INTRAVENOUS | Status: DC | PRN
  Administered 2017-01-24 (×3): 500 mL via INTRAVENOUS

## 2017-01-24 MED ORDER — DIPHENHYDRAMINE HCL 25 MG PO CAPS: 25.0000 mg | ORAL_CAPSULE | Freq: Four times a day (QID) | ORAL | Status: DC | PRN

## 2017-01-24 MED ORDER — ONDANSETRON HCL 4 MG/2ML IJ SOLN: 4.0000 mg | INTRAMUSCULAR | Status: DC | PRN

## 2017-01-24 MED ORDER — ONDANSETRON HCL 4 MG PO TABS: 4.0000 mg | ORAL_TABLET | ORAL | Status: DC | PRN

## 2017-01-24 MED ORDER — LIDOCAINE HCL (PF) 1 % IJ SOLN
30.0000 mL | INTRAMUSCULAR | Status: DC | PRN
  Filled 2017-01-24: qty 30

## 2017-01-24 MED ORDER — OXYCODONE-ACETAMINOPHEN 5-325 MG PO TABS: 1.0000 | ORAL_TABLET | ORAL | Status: DC | PRN

## 2017-01-24 MED ORDER — LACTATED RINGERS IV SOLN: INTRAVENOUS | Status: DC

## 2017-01-24 MED ORDER — OXYTOCIN 40 UNITS IN LACTATED RINGERS INFUSION - SIMPLE MED
1.0000 m[IU]/min | INTRAVENOUS | Status: DC
  Administered 2017-01-24: 2 m[IU]/min via INTRAVENOUS
  Filled 2017-01-24: qty 1000

## 2017-01-24 MED ORDER — FENTANYL 2.5 MCG/ML BUPIVACAINE 1/10 % EPIDURAL INFUSION (WH - ANES): 14.0000 mL/h | INTRAMUSCULAR | Status: DC | PRN

## 2017-01-24 MED ORDER — BENZOCAINE-MENTHOL 20-0.5 % EX AERO: 1.0000 "application " | INHALATION_SPRAY | CUTANEOUS | Status: DC | PRN

## 2017-01-24 MED ORDER — FENTANYL CITRATE (PF) 100 MCG/2ML IJ SOLN
50.0000 ug | INTRAMUSCULAR | Status: DC | PRN
  Administered 2017-01-24 (×2): 100 ug via INTRAVENOUS
  Filled 2017-01-24 (×2): qty 2

## 2017-01-24 MED ORDER — PHENYLEPHRINE 40 MCG/ML (10ML) SYRINGE FOR IV PUSH (FOR BLOOD PRESSURE SUPPORT): 80.0000 ug | PREFILLED_SYRINGE | INTRAVENOUS | Status: DC | PRN

## 2017-01-24 MED ORDER — INSULIN ASPART 100 UNIT/ML ~~LOC~~ SOLN: 12.0000 [IU] | Freq: Two times a day (BID) | SUBCUTANEOUS | Status: DC

## 2017-01-24 MED ORDER — SOD CITRATE-CITRIC ACID 500-334 MG/5ML PO SOLN: 30.0000 mL | ORAL | Status: DC | PRN

## 2017-01-24 MED ORDER — ACETAMINOPHEN 325 MG PO TABS: 650.0000 mg | ORAL_TABLET | ORAL | Status: DC | PRN

## 2017-01-24 MED ORDER — LACTATED RINGERS IV SOLN
INTRAVENOUS | Status: DC
  Administered 2017-01-24 (×3): via INTRAVENOUS

## 2017-01-24 MED ORDER — DIPHENHYDRAMINE HCL 50 MG/ML IJ SOLN: 12.5000 mg | INTRAMUSCULAR | Status: DC | PRN

## 2017-01-24 MED ORDER — LACTATED RINGERS IV SOLN: 500.0000 mL | INTRAVENOUS | Status: DC | PRN

## 2017-01-24 MED ORDER — MISOPROSTOL 200 MCG PO TABS
ORAL_TABLET | ORAL | Status: AC
  Administered 2017-01-24: 800 ug
  Filled 2017-01-24: qty 4

## 2017-01-24 MED ORDER — PRENATAL MULTIVITAMIN CH
1.0000 | ORAL_TABLET | Freq: Every day | ORAL | Status: DC
  Administered 2017-01-25: 1 via ORAL
  Filled 2017-01-24: qty 1

## 2017-01-24 MED ORDER — ZOLPIDEM TARTRATE 5 MG PO TABS: 5.0000 mg | ORAL_TABLET | Freq: Every evening | ORAL | Status: DC | PRN

## 2017-01-24 MED ORDER — SODIUM CHLORIDE 0.9 % IV SOLN
INTRAVENOUS | Status: DC
  Administered 2017-01-24: 0.7 [IU]/h via INTRAVENOUS
  Filled 2017-01-24: qty 2.5

## 2017-01-24 MED ORDER — DIBUCAINE 1 % RE OINT: 1.0000 "application " | TOPICAL_OINTMENT | RECTAL | Status: DC | PRN

## 2017-01-24 MED ORDER — SODIUM CHLORIDE 0.9% FLUSH
3.0000 mL | Freq: Two times a day (BID) | INTRAVENOUS | Status: DC
  Administered 2017-01-25: 3 mL via INTRAVENOUS

## 2017-01-24 MED ORDER — IBUPROFEN 600 MG PO TABS
600.0000 mg | ORAL_TABLET | Freq: Four times a day (QID) | ORAL | Status: DC
  Administered 2017-01-24 – 2017-01-26 (×6): 600 mg via ORAL
  Filled 2017-01-24 (×6): qty 1

## 2017-01-24 MED ORDER — INSULIN NPH (HUMAN) (ISOPHANE) 100 UNIT/ML ~~LOC~~ SUSP
6.0000 [IU] | Freq: Once | SUBCUTANEOUS | Status: AC
  Administered 2017-01-24: 6 [IU] via SUBCUTANEOUS
  Filled 2017-01-24: qty 10

## 2017-01-24 MED ORDER — LACTATED RINGERS IV SOLN
500.0000 mL | Freq: Once | INTRAVENOUS | Status: AC
  Administered 2017-01-24: 500 mL via INTRAVENOUS

## 2017-01-24 MED ORDER — OXYTOCIN BOLUS FROM INFUSION
500.0000 mL | Freq: Once | INTRAVENOUS | Status: AC
  Administered 2017-01-24: 500 mL via INTRAVENOUS

## 2017-01-24 MED ORDER — INSULIN NPH (HUMAN) (ISOPHANE) 100 UNIT/ML ~~LOC~~ SUSP: 12.0000 [IU] | Freq: Two times a day (BID) | SUBCUTANEOUS | Status: DC

## 2017-01-24 MED ORDER — WITCH HAZEL-GLYCERIN EX PADS: 1.0000 "application " | MEDICATED_PAD | CUTANEOUS | Status: DC | PRN

## 2017-01-24 MED ORDER — OXYCODONE-ACETAMINOPHEN 5-325 MG PO TABS
2.0000 | ORAL_TABLET | ORAL | Status: DC | PRN
  Filled 2017-01-24: qty 2

## 2017-01-24 MED ORDER — TERBUTALINE SULFATE 1 MG/ML IJ SOLN: 0.2500 mg | Freq: Once | INTRAMUSCULAR | Status: DC | PRN

## 2017-01-24 MED ORDER — SODIUM CHLORIDE 0.9 % IV SOLN: INTRAVENOUS | Status: DC

## 2017-01-24 MED ORDER — INSULIN NPH (HUMAN) (ISOPHANE) 100 UNIT/ML ~~LOC~~ SUSP
18.0000 [IU] | Freq: Every day | SUBCUTANEOUS | Status: DC
  Administered 2017-01-26: 18 [IU] via SUBCUTANEOUS
  Filled 2017-01-24: qty 10

## 2017-01-24 MED ORDER — SENNOSIDES-DOCUSATE SODIUM 8.6-50 MG PO TABS
2.0000 | ORAL_TABLET | ORAL | Status: DC
  Administered 2017-01-24 – 2017-01-25 (×2): 2 via ORAL
  Filled 2017-01-24 (×2): qty 2

## 2017-01-24 MED ORDER — INSULIN REGULAR HUMAN 100 UNIT/ML IJ SOLN: 12.0000 [IU] | Freq: Two times a day (BID) | INTRAMUSCULAR | Status: DC

## 2017-01-24 MED ORDER — SODIUM CHLORIDE 0.9 % IV SOLN: 250.0000 mL | INTRAVENOUS | Status: DC | PRN

## 2017-01-24 MED ORDER — ACETAMINOPHEN 325 MG PO TABS
650.0000 mg | ORAL_TABLET | ORAL | Status: DC | PRN
  Administered 2017-01-25: 650 mg via ORAL
  Filled 2017-01-24: qty 2

## 2017-01-24 MED ORDER — OXYCODONE-ACETAMINOPHEN 5-325 MG PO TABS: 2.0000 | ORAL_TABLET | ORAL | Status: DC | PRN

## 2017-01-24 MED ORDER — LIDOCAINE HCL (PF) 1 % IJ SOLN
INTRAMUSCULAR | Status: DC | PRN
  Administered 2017-01-24 (×2): 4 mL

## 2017-01-24 NOTE — H&P (Signed)
HPI: Cindy Bruce is a 26 y.o. year old G59P3003 female at 80w1dweeks gestation [facility]. [place] [facility] between 25w and 36w. Last sono performed at 33w GA had EFW of 2234g with AC diameter in the 86%.   OB History    Gravida Para Term Preterm AB Living   '4 3 3 '$ 0 0 3   SAB TAB Ectopic Multiple Live Births   0 0 0 0 3     Past Medical History:  Diagnosis Date  . Asthma   . Diabetes mellitus    Type 2 since 1st pg in 2008 - on insulin   Past Surgical History:  Procedure Laterality Date  . TOOTH EXTRACTION     Family History: family history includes Diabetes in her maternal grandmother and mother. Social History:  reports that she has quit smoking. Her smoking use included Cigarettes. She smoked 1.00 pack per day. She has quit using smokeless tobacco. She reports that she does not drink alcohol or use drugs.     Maternal Diabetes: Yes:  Diabetes Type:  Pre-pregnancy, Insulin/Medication controlled Genetic Screening: Normal Maternal Ultrasounds/Referrals: Normal Fetal Ultrasounds or other Referrals:  Fetal echo, never performed  Maternal Substance Abuse:  No Significant Maternal Medications:  Meds include: Other: Insulin Significant Maternal Lab Results:  Lab values include: Group B Strep positive, Rh negative Other Comments:  None  ROS History Dilation: 3.5 Effacement (%): 50, 60 Station: -3 Exam by:: C. Leshowitz, RN Blood pressure 124/83, pulse (!) 103, temperature 97.7 F (36.5 C), temperature source Oral, resp. rate 18, height '5\' 1"'$  (1.549 m), weight 184 lb (83.5 kg), last menstrual period 04/20/2016. Exam Physical Exam  Constitutional: She is oriented to person, place, and time. She appears well-developed and well-nourished. No distress.  HENT:  Head: Normocephalic and atraumatic.  Mouth/Throat: Oropharynx is clear and moist.  Eyes:  EOM are normal.  Cardiovascular: Normal rate and regular rhythm.   Respiratory: Effort normal and breath sounds normal.  GI: Soft. There is no tenderness.  Musculoskeletal: Normal range of motion.  Neurological: She is alert and oriented to person, place, and time.  Skin: Skin is warm and dry.  Psychiatric: She has a normal mood and affect. Thought content normal.    Prenatal labs: ABO, Rh: --/--/O NEG (02/18 0104) Antibody: NEG (02/18 0104) Rubella: <0.90 (09/13 1657) RPR: Non Reactive (09/13 1657)  HBsAg: Negative (09/13 1657)  HIV: Non Reactive (09/13 1657)  GBS: Positive (01/31 1504)   Assessment/Plan: Cindy SUNDELLis a 26y.o. [id number] 338w1dere for IOL for BDM-insulin dependent. Pregnancy complicated by very limited prenatal care.    #Labor: Expectant management. Will start Pitocin if contractions space out.  #Class B DM: Glucose stabilizer while in labor  #Pain:  Epidural upon request.  #FWB: Cat I  #ID:      GBS Pos >> PCN  #MOF: Breast  #MOC: Undecided. Tubal papers were signed at 36w visit (would need interval tubal).  #Circ:   N/A  #Rubella Non-Immune: MMR Vaccine post-delivery  #Rh negative: Did not receive Rhogam at 28w due to [facility] after [facility].    ViManya Silvas/18/2018, 3:50 AM

## 2017-01-24 NOTE — Progress Notes (Signed)
Cindy Bruce is a 26 y.o. [phone] at 7671w1d by ultrasound admitted for induction of labor due to Diabetes.  Subjective:   Objective: BP (!) 101/52   Pulse 84   Temp 97.3 F (36.3 C) (Oral)   Resp 18   Ht 5\' 1"  (1.549 m)   Wt 184 lb (83.5 kg)   LMP 04/20/2016 (Approximate)   BMI 34.77 kg/m  No intake/output data recorded. No intake/output data recorded.  FHT:  FHR: 150's bpm, variability: moderate,  accelerations:  Abscent,  decelerations:  Present variable UC:   regular, every 2-4 minutes SVE:   Dilation: 4.5 Effacement (%): 50, 60 Station: -3, -2 Exam by:: Lorretta Harp. Brown RNC  Labs: Lab Results  Component Value Date   WBC 7.5 01/24/2017   HGB 10.3 (L) 01/24/2017   HCT 31.2 (L) 01/24/2017   MCV 71.1 (L) 01/24/2017   PLT 255 01/24/2017    Assessment / Plan: Induction of labor due to [facility] conditions,  progressing well on pitocin  Labor: Progressing normally Preeclampsia:  no signs or symptoms of toxicity and intake and ouput balanced Fetal Wellbeing:  Category II Pain Control:  IV pain meds I/D:  n/a Anticipated MOD:  NSVD  Cindy Bruce 01/24/2017, 1:43 PM

## 2017-01-24 NOTE — Progress Notes (Signed)
Patient ID: Cindy Bruce, female   DOB: [birth year], 26 y.o.   MRN: [id number] Cindy Bruce is a 26 y.o. G4P3003 at 2344w1d.  Subjective: Mild contractions.   Objective: BP 125/80 (BP Location: Right Arm)   Pulse 70   Temp 98.7 F (37.1 C) (Oral)   Resp 14   Ht 5\' 1"  (1.549 m)   Wt 184 lb (83.5 kg)   LMP 04/20/2016 (Approximate)   BMI 34.77 kg/m    FHT:  FHR: 140 bpm, variability: min-mod,  accelerations:  15x15,  decelerations:  Few variables and lates, resolved UC:   Q 2-8 minutes, mild-mod Dilation: 4 Effacement (%): 50 Cervical Position: Middle Station: -3 Presentation: Vertex Exam by:: Cindy ChanceNora Weatherby, RN  Labs: Results for orders placed or performed during the hospital encounter of 01/24/17 (from the past 24 hour(s))  CBC     Status: Abnormal   Collection Time: 01/24/17  1:04 AM  Result Value Ref Range   WBC 7.5 4.0 - 10.5 K/uL   RBC 4.39 3.87 - 5.11 MIL/uL   Hemoglobin 10.3 (L) 12.0 - 15.0 g/dL   HCT 40.931.2 (L) 81.136.0 - 91.446.0 %   MCV 71.1 (L) 78.0 - 100.0 fL   MCH 23.5 (L) 26.0 - 34.0 pg   MCHC 33.0 30.0 - 36.0 g/dL   RDW 78.216.2 (H) 95.611.5 - 21.315.5 %   Platelets 255 150 - 400 K/uL  Type and screen [facility]      Status: None   Collection Time: 01/24/17  1:04 AM  Result Value Ref Range   ABO/RH(D) O NEG    Antibody Screen NEG    Sample Expiration 01/27/2017   Glucose, capillary     Status: Abnormal   Collection Time: 01/24/17  2:46 AM  Result Value Ref Range   Glucose-Capillary 157 (H) 65 - 99 mg/dL  Glucose, capillary     Status: Abnormal   Collection Time: 01/24/17  4:30 AM  Result Value Ref Range   Glucose-Capillary 124 (H) 65 - 99 mg/dL  Glucose, capillary     Status: Abnormal   Collection Time: 01/24/17  5:24 AM  Result Value Ref Range   Glucose-Capillary 125 (H) 65 - 99 mg/dL  Glucose, capillary     Status: Abnormal   Collection Time: 01/24/17  6:27 AM  Result Value Ref Range   Glucose-Capillary 119 (H) 65 - 99 mg/dL    Assessment /  Plan: 944w1d week IUP Labor: Early Fetal Wellbeing:  Category I-II, overall reassuring Pain Control:  None Anticipated MOD:  SVD Start pitocin.  [place]ia Seleena Bruce, CNM 01/24/2017 7:20 AM

## 2017-01-24 NOTE — Anesthesia Procedure Notes (Signed)
Epidural Patient location during procedure: OB  Staffing Anesthesiologist: Lewie LoronGERMEROTH, Amzie Sillas Performed: anesthesiologist   Preanesthetic Checklist Completed: patient identified, pre-op evaluation, timeout performed, IV checked, risks and benefits discussed and monitors and equipment checked  Epidural Patient position: sitting Prep: site prepped and draped and DuraPrep Patient monitoring: heart rate, blood pressure and continuous pulse ox Approach: midline Injection technique: LOR air and LOR saline  Needle:  Needle type: Tuohy  Needle gauge: 17 G Needle length: 9 cm Needle insertion depth: 6 cm Catheter type: closed end flexible Catheter size: 19 Gauge Catheter at skin depth: 12 cm Test dose: negative  Assessment Sensory level: T8 Events: blood not aspirated, injection not painful, no injection resistance, negative IV test and no paresthesia  Additional Notes Reason for block:procedure for pain

## 2017-01-24 NOTE — Progress Notes (Signed)
Cindy Bruce is a 26 y.o. [phone] at 181w1d  Subjective: Patient doing well. Having contractions every 8-9 mins.  Objective: BP 117/78   Pulse 98   Temp 98.5 F (36.9 C) (Oral)   Resp 20   Ht 5\' 1"  (1.549 m)   Wt 184 lb (83.5 kg)   LMP 04/20/2016 (Approximate)   BMI 34.77 kg/m  No intake/output data recorded. No intake/output data recorded.  FHT:  FHR: 150 bpm, variability: moderate,  accelerations:  Present,  decelerations:  Present occasional decelerations UC:   regular, every 8-9 minutes SVE:   Dilation: 6 Effacement (%): 50 Station: 0 Exam by:: Dr. Abelardo DieselMcMullen  Labs: Lab Results  Component Value Date   WBC 7.5 01/24/2017   HGB 10.3 (L) 01/24/2017   HCT 31.2 (L) 01/24/2017   MCV 71.1 (L) 01/24/2017   PLT 255 01/24/2017    Assessment / Plan: Spontaneous labor, progressing normally, will reposition given decelerations and monitor for change  Labor: Progressing normally Preeclampsia:  None Fetal Wellbeing:  Category II Pain Control:  IV pain meds Anticipated MOD:  NSVD  Wendee Beaversavid J McMullen, DO, PGY-1 01/24/2017, 10:27 AM

## 2017-01-24 NOTE — Anesthesia Preprocedure Evaluation (Signed)
Anesthesia Evaluation  Patient identified by MRN, date of birth, ID band Patient awake    Reviewed: Allergy & Precautions, H&P , Patient's Chart, lab work & pertinent test results  Airway Mallampati: III  TM Distance: >3 FB Neck ROM: full    Dental no notable dental hx. (+) Teeth Intact   Pulmonary asthma , former smoker,    Pulmonary exam normal breath sounds clear to auscultation       Cardiovascular negative cardio ROS   Rhythm:regular Rate:Normal     Neuro/Psych negative neurological ROS  negative psych ROS   GI/Hepatic negative GI ROS, Neg liver ROS,   Endo/Other  diabetes, Well Controlled, Type 2, Insulin DependentObesity  Renal/GU negative Renal ROS  negative genitourinary   Musculoskeletal   Abdominal Normal abdominal exam  (+)   Peds  Hematology negative hematology ROS (+)   Anesthesia Other Findings   Reproductive/Obstetrics (+) Pregnancy                             Anesthesia Physical  Anesthesia Plan  ASA: II  Anesthesia Plan: Epidural   Post-op Pain Management:    Induction:   Airway Management Planned:   Additional Equipment:   Intra-op Plan:   Post-operative Plan:   Informed Consent: I have reviewed the patients History and Physical, chart, labs and discussed the procedure including the risks, benefits and alternatives for the proposed anesthesia with the patient or authorized representative who has indicated his/her understanding and acceptance.     Plan Discussed with:   Anesthesia Plan Comments:         Anesthesia Quick Evaluation

## 2017-01-24 NOTE — Anesthesia Pain Management Evaluation Note (Signed)
  CRNA Pain Management Visit Note  Patient: Cindy Bruce, 26 y.o., female  "Hello [facility]. We have an anesthesia team available at all times to provide care throughout the hospital, including epidural management and anesthesia for C-section. I don't know your plan for the delivery whether it a natural birth, water birth, IV sedation, nitrous supplementation, doula or epidural, but we want to meet your pain goals."   1.Was your pain managed to your expectations on prior hospitalizations?   Yes   2.What is your expectation for pain management during this hospitalization?     Epidural  3.How can we help you reach that goal? unsure  Record the patient's initial score and the patient's pain goal.   Pain: 8  Pain Goal: 4 [facility] wants you to be able to say your pain was always managed very well.  Cephus ShellingBURGER,Mehkai Gallo 01/24/2017

## 2017-01-25 ENCOUNTER — Telehealth: Payer: Self-pay | Admitting: Internal Medicine

## 2017-01-25 LAB — GLUCOSE, CAPILLARY
Glucose-Capillary: 82 mg/dL (ref 65–99)
Glucose-Capillary: 130 mg/dL — ABNORMAL HIGH (ref 65–99)
Glucose-Capillary: 160 mg/dL — ABNORMAL HIGH (ref 65–99)

## 2017-01-25 MED ORDER — INSULIN NPH (HUMAN) (ISOPHANE) 100 UNIT/ML ~~LOC~~ SUSP
6.0000 [IU] | Freq: Once | SUBCUTANEOUS | Status: AC
  Administered 2017-01-25: 6 [IU] via SUBCUTANEOUS

## 2017-01-25 MED ORDER — INSULIN NPH (HUMAN) (ISOPHANE) 100 UNIT/ML ~~LOC~~ SUSP
4.0000 [IU] | Freq: Once | SUBCUTANEOUS | Status: AC
  Administered 2017-01-25: 4 [IU] via SUBCUTANEOUS

## 2017-01-25 NOTE — Progress Notes (Signed)
Patient did not call out for blood sugar before lunch time. Encouraged patient to call prior to eating next meal for blood sugar. Cindy Bruce, Cindy Bruce

## 2017-01-25 NOTE — Telephone Encounter (Signed)
Opened telephone call in error.   Marcy Sirenatherine Bevin Mayall, D.O. 01/25/2017, 8:56 AM PGY-2, [facility]

## 2017-01-25 NOTE — Lactation Note (Signed)
This note was copied from a baby's chart. Lactation Consultation Note Mom P4, stated she didn't BF her other 3 children because they wouldn't latch. Mom stated BF wasn't going well with this baby. Baby does have a recessed chin. Discussed chin tug. LC saw mom had asked for formula d/t baby wouldn't feed. Mom is breast formula feeding.  Mom has decided to just formula bottle feed. Discussed options of pumping and bottle feeding BM. Mom stated she just wanted to formula feed.  Discussed engorgement management. Encouraged to call for staff if changes mind. Patient Name: Cindy Katheran JamesZenda Bruce ZOXWR'UToday's Date: 01/25/2017     Maternal Data    Feeding    LATCH Score/Interventions                      Lactation Tools Discussed/Used     Consult Status      Caelie [place], Diamond NickelLAURA G 01/25/2017, 3:21 AM

## 2017-01-25 NOTE — Anesthesia Postprocedure Evaluation (Signed)
Anesthesia Post Note  Patient: Earleen NewportZenda T Ytuarte  Procedure(s) Performed: * No procedures listed *  Patient location during evaluation: Mother Baby Anesthesia Type: Epidural Level of consciousness: awake Pain management: pain level controlled Vital Signs Assessment: post-procedure vital signs reviewed and stable Respiratory status: spontaneous breathing Cardiovascular status: stable Postop Assessment: no headache, epidural receding and patient able to bend at knees Anesthetic complications: no        Last Vitals:  Vitals:   01/25/17 0109 01/25/17 0540  BP: 110/63 118/63  Pulse: 87 80  Resp: 16 16  Temp: 37.1 C 37.1 C    Last Pain:  Vitals:   01/25/17 0638  TempSrc:   PainSc: Asleep   Pain Goal: Patients Stated Pain Goal: 3 (01/24/17 0128)               Edison PaceWILKERSON,Carleen Rhue

## 2017-01-25 NOTE — Progress Notes (Signed)
Post Partum Day 1 Subjective: no complaints, up ad lib, voiding and tolerating PO  Objective: Blood pressure 118/63, pulse 80, temperature 98.7 F (37.1 C), temperature source Oral, resp. rate 16, height 5\' 1"  (1.549 m), weight 184 lb (83.5 kg), last menstrual period 04/20/2016, SpO2 100 %, unknown if currently breastfeeding.  Physical Exam:  General: alert, cooperative, appears stated age and no distress Lochia: appropriate Uterine Fundus: firm Incision: n/a DVT Evaluation: No evidence of DVT seen on physical exam.   Recent Labs  01/24/17 0104  HGB 10.3*  HCT 31.2*    Assessment/Plan: Plan for discharge tomorrow   LOS: 1 day   Cindy Bruce 01/25/2017, 7:17 AM

## 2017-01-25 NOTE — Plan of Care (Signed)
Problem: Metabolic: Goal: Ability to maintain appropriate glucose levels will improve Fasting blood sugar this morning before breakfast was 82. Notified Dr. Earlene Bruce. Dr. Earlene Bruce ordered to hold novolog dose and only give 4 units of NPH with breakfast. Order given to recheck blood sugar in two hours and notify MD of result. Will continue to monitor closely. Earl Galasborne, Linda HedgesStefanie [place] Heights[place]udspeth

## 2017-01-25 NOTE — Progress Notes (Signed)
CSW received consult for limited prenatal care/missed visits.  CSW reviewed chart noting 6 prenatal appointments.  CSW is screening out referral as it does not meet criteria for limited prenatal care (less than 3 visits per policy).   

## 2017-01-25 NOTE — Progress Notes (Signed)
Two hour post prandial/post 4 unit NPH blood sugar 130. Called and notified Dr. Durward Parcelavid McMullen. No new orders given at this time. Earl Galasborne, Linda HedgesStefanie [place]

## 2017-01-25 NOTE — Lactation Note (Signed)
This note was copied from a baby's chart. Lactation Consultation Note See note Patient Name: [name] Today's Date: 01/25/2017 Reason for consult: Initial assessment   Maternal Data Has patient been taught Hand Expression?: Yes Does the patient have breastfeeding experience prior to this delivery?: No  Feeding    LATCH Score/Interventions       Type of Nipple: Everted at rest and after stimulation  Comfort (Breast/Nipple): Soft / non-tender           Lactation Tools Discussed/Used     Consult Status Consult Status: Complete Date: 01/25/17    Charyl DancerCARVER, Davelyn Gwinn G 01/25/2017, 3:27 AM

## 2017-01-26 LAB — GLUCOSE, CAPILLARY: GLUCOSE-CAPILLARY: 133 mg/dL — AB (ref 65–99)

## 2017-01-26 MED ORDER — IBUPROFEN 600 MG PO TABS: 600.0000 mg | ORAL_TABLET | Freq: Four times a day (QID) | ORAL | 2 refills | Status: DC

## 2017-01-26 MED ORDER — INSULIN NPH (HUMAN) (ISOPHANE) 100 UNIT/ML ~~LOC~~ SUSP: 18.0000 [IU] | Freq: Every day | SUBCUTANEOUS | 2 refills | Status: DC

## 2017-01-26 MED ORDER — SENNOSIDES-DOCUSATE SODIUM 8.6-50 MG PO TABS: 2.0000 | ORAL_TABLET | Freq: Every day | ORAL | 2 refills | Status: DC

## 2017-01-26 MED ORDER — METFORMIN HCL 1000 MG PO TABS: 1000.0000 mg | ORAL_TABLET | Freq: Two times a day (BID) | ORAL | 2 refills | Status: DC

## 2017-01-26 MED ORDER — INSULIN ASPART 100 UNIT/ML ~~LOC~~ SOLN: 12.0000 [IU] | Freq: Two times a day (BID) | SUBCUTANEOUS | 2 refills | Status: DC

## 2017-01-26 MED ORDER — FERROUS GLUCONATE 240 (27 FE) MG PO TABS: 240.0000 mg | ORAL_TABLET | Freq: Three times a day (TID) | ORAL | 2 refills | Status: DC

## 2017-01-26 NOTE — Discharge Summary (Addendum)
OB Discharge Summary     Patient Name: Cindy Bruce DOB: 1991/05/21 MRN: [id number]  Date of admission: 01/24/2017 Delivering MD: Wyvonnia Dusky D   Date of discharge: 01/26/2017  Admitting diagnosis: 12 to 14 weeks experiencing, lower back pain headaches between 1 an 6 pain level Intrauterine pregnancy: [redacted]w[redacted]d     Secondary diagnosis:  Active Problems:   Diabetes in pregnancy   DM (diabetes mellitus) in pregnancy  Additional problems: Limited prenatal care     Discharge diagnosis: Term Pregnancy Delivered and Diabetes on insulin                                                                                                Post partum procedures:rhogam  Augmentation: AROM and Pitocin  Complications: None, 2 minute shoulder dystocia  Hospital course:  Induction of Labor With Vaginal Delivery   26 y.o. yo [id number] at [redacted]w[redacted]d was admitted to the hospital 01/24/2017 for induction of labor.  Indication for induction: BDM-Insulin Dependent. No prenatal care between 25-36 weeks..  Patient had an uncomplicated labor course as follows: Membrane Rupture Time/Date: 1:54 PM ,01/24/2017   Intrapartum Procedures: Episiotomy: None [1]                                         Lacerations:  None [1]  Patient had delivery of a Viable infant.  Information for the patient's newborn:  Brinsley, Wence [[id number]]  Delivery Method: Vaginal, Spontaneous Delivery (Filed from Delivery Summary)   01/24/2017  Details of delivery can be found in separate delivery note.  Patient had a routine postpartum course. Patient is discharged home 01/26/17.  Physical exam  Vitals:   01/25/17 0109 01/25/17 0540 01/25/17 1735 01/26/17 0624  BP: 110/63 118/63 122/77 118/63  Pulse: 87 80 76 97  Resp: 16 16 18 18   Temp: 98.7 F (37.1 C) 98.7 F (37.1 C) 98.6 F (37 C) 98.4 F (36.9 C)  TempSrc: Oral Oral Oral Oral  SpO2: 100%     Weight:      Height:       General: alert, cooperative and no distress Lochia:  appropriate Uterine Fundus: firm Incision: N/A DVT Evaluation: No evidence of DVT seen on physical exam. No cords or calf tenderness. No significant calf/ankle edema. Labs: Lab Results  Component Value Date   WBC 7.5 01/24/2017   HGB 10.3 (L) 01/24/2017   HCT 31.2 (L) 01/24/2017   MCV 71.1 (L) 01/24/2017   PLT 255 01/24/2017   CMP Latest Ref Rng & Units 12/22/2016  Glucose 65 - 99 mg/dL 130(Q)  BUN 6 - 20 mg/dL 9  Creatinine 6.57 - 8.46 mg/dL 9.62  Sodium 952 - 841 mmol/L 132(L)  Potassium 3.5 - 5.1 mmol/L 3.4(L)  Chloride 101 - 111 mmol/L 103  CO2 22 - 32 mmol/L 18(L)  Calcium 8.9 - 10.3 mg/dL 8.3(L)  Total Protein 6.5 - 8.1 g/dL -  Total Bilirubin 0.3 - 1.2 mg/dL -  Alkaline Phos 38 - 324 U/L -  AST 15 - 41 U/L -  ALT 14 - 54 U/L -    Discharge instruction: per After Visit Summary and "Baby and Me Booklet".  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   ibuprofen 600 MG tablet Commonly known as:  ADVIL,MOTRIN Take 1 tablet (600 mg total) by mouth every 6 (six) hours.   metFORMIN 1000 MG tablet Commonly known as:  GLUCOPHAGE Take 1 tablet (1,000 mg total) by mouth 2 (two) times daily with a meal.   PRENATAL GUMMIES/DHA & FA 0.4-32.5 MG Chew Chew 2 each by mouth daily at 12 noon.   senna-docusate 8.6-50 MG tablet Commonly known as:   Senokot-S Take 2 tablets by mouth at bedtime.       Diet: carb modified diet  Activity: Advance as tolerated. Pelvic rest for 6 weeks.   Outpatient follow up:2 weeks Follow up Appt:No future appointments. Follow up Visit:No Follow-up on file.  Postpartum contraception: Tubal Ligation and planned postpartum  Newborn Data: Live born female  Birth Weight: 7 lb 4.1 oz (3290 g) APGAR: 1, 9  Baby Feeding: Breast Disposition:home with mother   01/26/2017 Roe Coombsachelle A Deetra Booton, CNM

## 2017-01-26 NOTE — Progress Notes (Addendum)
Post Partum Day #2 Subjective: no complaints, up ad lib, voiding and tolerating PO.  Breast feeding is going well.  Desires discharge home this morning.    Objective: Blood pressure 118/63, pulse 97, temperature 98.4 F (36.9 C), temperature source Oral, resp. rate 18, height 5\' 1"  (1.549 m), weight 184 lb (83.5 kg), last menstrual period 04/20/2016, SpO2 100 %, unknown if currently breastfeeding.  Physical Exam:  General: alert, cooperative and no distress Lochia: appropriate Uterine Fundus: firm Incision: none DVT Evaluation: No evidence of DVT seen on physical exam. No cords or calf tenderness. No significant calf/ankle edema. CBG range: 82-160  Recent Labs  01/24/17 0104  HGB 10.3*  HCT 31.2*    Assessment/Plan: Discharge home, Breastfeeding and Contraception Interval BTL planned. Needs endocrinology consult postpartum as well as primcary care PCP referrals placed.   Limited prental care this pregnancy.  After discussion with attendings: start on Metformin 1000 mg BID and stop insulins.  Metformin ordered.    LOS: 2 days   Roe CoombsRachelle A Smriti Barkow, CNM 01/26/2017, 7:49 AM

## 2017-02-17 ENCOUNTER — Ambulatory Visit: Payer: Medicaid Other | Admitting: Certified Nurse Midwife

## 2017-03-01 ENCOUNTER — Ambulatory Visit: Payer: Medicaid Other | Admitting: Certified Nurse Midwife

## 2017-03-17 ENCOUNTER — Ambulatory Visit: Payer: Medicaid Other | Admitting: Certified Nurse Midwife

## 2017-03-28 ENCOUNTER — Encounter (HOSPITAL_COMMUNITY): Payer: Self-pay | Admitting: Emergency Medicine

## 2017-03-28 ENCOUNTER — Emergency Department (HOSPITAL_COMMUNITY)
Admission: EM | Admit: 2017-03-28 | Discharge: 2017-03-28 | Disposition: A | Discharge status: Home / Self Care | Payer: Medicaid Other | Attending: Emergency Medicine | Admitting: Emergency Medicine

## 2017-03-28 DIAGNOSIS — J45909 Unspecified asthma, uncomplicated: Secondary | ICD-10-CM | POA: Diagnosis not present

## 2017-03-28 DIAGNOSIS — Z87891 Personal history of nicotine dependence: Secondary | ICD-10-CM | POA: Insufficient documentation

## 2017-03-28 DIAGNOSIS — E119 Type 2 diabetes mellitus without complications: Secondary | ICD-10-CM | POA: Diagnosis not present

## 2017-03-28 DIAGNOSIS — Z79899 Other long term (current) drug therapy: Secondary | ICD-10-CM | POA: Insufficient documentation

## 2017-03-28 DIAGNOSIS — Z7984 Long term (current) use of oral hypoglycemic drugs: Secondary | ICD-10-CM | POA: Insufficient documentation

## 2017-03-28 DIAGNOSIS — K0889 Other specified disorders of teeth and supporting structures: Secondary | ICD-10-CM

## 2017-03-28 MED ORDER — PENICILLIN V POTASSIUM 500 MG PO TABS: 500.0000 mg | ORAL_TABLET | Freq: Four times a day (QID) | ORAL | 0 refills | Status: DC

## 2017-03-28 MED ORDER — NAPROXEN 500 MG PO TABS: 500.0000 mg | ORAL_TABLET | Freq: Two times a day (BID) | ORAL | 0 refills | Status: DC

## 2017-03-28 NOTE — Discharge Instructions (Signed)
Take the prescribed medication as directed. Follow-up with dentist-- Dr. Michiel Sites is on call today.  Can also see one of the other clinics as well.  See attached resource guide. Return to the ED for new or worsening symptoms.

## 2017-03-28 NOTE — ED Provider Notes (Signed)
[facility]-EMERGENCY DEPT Provider Note   CSN: [id number] Arrival date & time: 03/28/17  2138     History   Chief Complaint Chief Complaint  Patient presents with  . Dental Pain    HPI Cindy Bruce is a 26 y.o. female.  The history is provided by the patient and medical records.  Dental Pain     26 year old female here with upper dental pain for the past few days. Reports she walked into a wall and broke her front teeth. States she has had worsening pain ever since. She denies any trouble swallowing but has had some pain with eating. She's not had any fever or chills. No facial or neck swelling. States she did contact her case manager, however she is not eligible for dental visit until her insurance takes effect which will likely take another few weeks. She has not tried anything for pain.  Past Medical History:  Diagnosis Date  . Asthma   . Diabetes mellitus    Type 2 since 1st pg in 2008 - on insulin    Patient Active Problem List   Diagnosis Date Noted  . Diabetes in pregnancy 01/24/2017  . DM (diabetes mellitus) in pregnancy 01/24/2017  . GBS (group B streptococcus) infection 01/21/2017  . Limited prenatal care, antepartum 12/23/2016  . Supervision of high risk pregnancy, antepartum 08/19/2016  . Diabetes mellitus complicating pregnancy, antepartum 09/11/2013  . Rh negative state in antepartum period 08/24/2013  . Rubella non-immune status, antepartum 07/11/2012    Past Surgical History:  Procedure Laterality Date  . TOOTH EXTRACTION      OB History    Gravida Para Term Preterm AB Living   0 0 4   SAB TAB Ectopic Multiple Live Births   0 0 0 0 4       Home Medications    Prior to Admission medications   Medication Sig Start Date End Date Taking? Authorizing Provider  ferrous gluconate (IRON 27) 240 (27 FE) MG tablet Take 1 tablet (240 mg total) by mouth 3 (three) times daily with meals. 01/26/17   Rachelle A Denney, CNM  ibuprofen (ADVIL,MOTRIN) 600  MG tablet Take 1 tablet (600 mg total) by mouth every 6 (six) hours. 01/26/17   Rachelle A Denney, CNM  metFORMIN (GLUCOPHAGE) 1000 MG tablet Take 1 tablet (1,000 mg total) by mouth 2 (two) times daily with a meal. 01/26/17   Rachelle A Denney, CNM  Prenatal MV-Min-FA-Omega-3 (PRENATAL GUMMIES/DHA & FA) 0.4-32.5 MG CHEW Chew 2 each by mouth daily at 12 noon.    Historical Provider, MD  senna-docusate (SENOKOT-S) 8.6-50 MG tablet Take 2 tablets by mouth at bedtime. 01/26/17   Roe Coombs, CNM    Family History Family History  Problem Relation Age of Onset  . Diabetes Mother   . Diabetes Maternal Grandmother     Social History Social History  Substance Use Topics  . Smoking status: Former Smoker    Packs/day: 1.00    Types: Cigarettes  . Smokeless tobacco: Former Neurosurgeon  . Alcohol use No     Comment: occ     Allergies   Food and Watermelon [citrullus vulgaris]   Review of Systems Review of Systems  HENT: Positive for dental problem.   All other systems reviewed and are negative.    Physical Exam Updated Vital Signs BP 137/77 (BP Location: Right Arm)   Pulse (!) 107   Temp 98.6 F (37 C) (Oral)   Resp 18  Ht  (1.549 m)   Wt 72.6 kg   LMP 03/06/2017   SpO2 99%   BMI 30.23 kg/m   Physical Exam  Constitutional: She is oriented to person, place, and time. She appears well-developed and well-nourished.  HENT:  Head: Normocephalic and atraumatic.  Mouth/Throat: Oropharynx is clear and moist.  Teeth largely in very poor dentition, both upper central incisors are broken as well as a few other teeth, overall there is severe decay of multiple teeth, surrounding gingiva appears swollen without definitive abscess, there is note of gingivitis, handling secretions appropriately, no trismus, no facial or neck swelling, normal phonation without stridor  Eyes: Conjunctivae and EOM are normal. Pupils are equal, round, and reactive to light.  Neck: Normal range of motion.    Cardiovascular: Normal rate, regular rhythm and normal heart sounds.   Pulmonary/Chest: Effort normal and breath sounds normal.  Abdominal: Soft. Bowel sounds are normal.  Musculoskeletal: Normal range of motion.  Neurological: She is alert and oriented to person, place, and time.  Skin: Skin is warm and dry.  Psychiatric: She has a normal mood and affect.  Nursing note and vitals reviewed.    ED Treatments / Results  Labs (all labs ordered are listed, but only abnormal results are displayed) Labs Reviewed - No data to display  EKG  EKG Interpretation None       Radiology No results found.  Procedures Procedures (including critical care time)  Medications Ordered in ED Medications - No data to display   Initial Impression / Assessment and Plan / ED Course  I have reviewed the triage vital signs and the nursing notes.  Pertinent labs & imaging results that were available during my care of the patient were reviewed by me and considered in my medical decision making (see chart for details).  26 year old female here with upper dental pain. Reports she broke her front teeth 2 days ago after walking into a wall. On exam, it appears that these teeth have been broken for quite a bit longer and she has generalized severe decay. There is no apparent nerve root visible. Chest extensive gingivitis throughout the mouth. I do not see any definitive abscess or drainable fluid collection at this time.  No facial or neck swelling.  Handling secretions well.  Not clinically concerning for ludwig's angina.  Will start on abx and refer to dentist for follow-up.  Discussed plan with patient, she acknowledged understanding and agreed with plan of care.  Return precautions given for new or worsening symptoms.  Final Clinical Impressions(s) / ED Diagnoses   Final diagnoses:  Pain, dental    New Prescriptions New Prescriptions   NAPROXEN (NAPROSYN) 500 MG TABLET    Take 1 tablet (500 mg  total) by mouth 2 (two) times daily with a meal.   PENICILLIN V POTASSIUM (VEETID) 500 MG TABLET    Take 1 tablet (500 mg total) by mouth 4 (four) times daily.     Garlon Hatchet, PA-C 03/28/17 2306    Rolland Porter, MD 04/10/17 [phone]

## 2017-03-28 NOTE — ED Triage Notes (Signed)
Pt reports running into wall and chipping front teeth 2 days ago. Pt c/o upper mouth pain now.

## 2017-03-28 NOTE — ED Notes (Signed)
Pt c/o 7/10 pain to the upper row of teeth. Pt stated she got hit in the front teeth a couple days ago which led to the front two teeth being chipped. There are other teeth that have fallen out.

## 2017-05-10 ENCOUNTER — Emergency Department (HOSPITAL_COMMUNITY)
Admission: EM | Admit: 2017-05-10 | Discharge: 2017-05-10 | Discharge status: Against Medical Advice | Payer: Medicaid Other

## 2017-05-21 ENCOUNTER — Emergency Department (HOSPITAL_COMMUNITY)
Admission: EM | Admit: 2017-05-21 | Discharge: 2017-05-22 | Disposition: A | Discharge status: Home / Self Care | Payer: Medicaid Other | Attending: Emergency Medicine | Admitting: Emergency Medicine

## 2017-05-21 ENCOUNTER — Emergency Department (HOSPITAL_COMMUNITY): Payer: Medicaid Other

## 2017-05-21 ENCOUNTER — Encounter (HOSPITAL_COMMUNITY): Payer: Self-pay | Admitting: Nurse Practitioner

## 2017-05-21 DIAGNOSIS — Z7984 Long term (current) use of oral hypoglycemic drugs: Secondary | ICD-10-CM | POA: Insufficient documentation

## 2017-05-21 DIAGNOSIS — Z791 Long term (current) use of non-steroidal anti-inflammatories (NSAID): Secondary | ICD-10-CM | POA: Insufficient documentation

## 2017-05-21 DIAGNOSIS — Z87891 Personal history of nicotine dependence: Secondary | ICD-10-CM | POA: Insufficient documentation

## 2017-05-21 DIAGNOSIS — E119 Type 2 diabetes mellitus without complications: Secondary | ICD-10-CM | POA: Insufficient documentation

## 2017-05-21 DIAGNOSIS — Z79899 Other long term (current) drug therapy: Secondary | ICD-10-CM | POA: Insufficient documentation

## 2017-05-21 DIAGNOSIS — R0789 Other chest pain: Secondary | ICD-10-CM | POA: Insufficient documentation

## 2017-05-21 DIAGNOSIS — J45909 Unspecified asthma, uncomplicated: Secondary | ICD-10-CM | POA: Insufficient documentation

## 2017-05-21 LAB — BASIC METABOLIC PANEL
ANION GAP: 9 (ref 5–15)
BUN: 6 mg/dL (ref 6–20)
CO2: 20 mmol/L — ABNORMAL LOW (ref 22–32)
Calcium: 9.1 mg/dL (ref 8.9–10.3)
Chloride: 105 mmol/L (ref 101–111)
Creatinine, Ser: 0.56 mg/dL (ref 0.44–1.00)
Glucose, Bld: 269 mg/dL — ABNORMAL HIGH (ref 65–99)
POTASSIUM: 3.5 mmol/L (ref 3.5–5.1)
SODIUM: 134 mmol/L — AB (ref 135–145)

## 2017-05-21 LAB — CBC
HCT: 32.5 % — ABNORMAL LOW (ref 36.0–46.0)
HEMOGLOBIN: 11.1 g/dL — AB (ref 12.0–15.0)
MCH: 25.4 pg — ABNORMAL LOW (ref 26.0–34.0)
MCHC: 34.2 g/dL (ref 30.0–36.0)
MCV: 74.4 fL — ABNORMAL LOW (ref 78.0–100.0)
Platelets: 252 10*3/uL (ref 150–400)
RBC: 4.37 MIL/uL (ref 3.87–5.11)
RDW: 16.4 % — ABNORMAL HIGH (ref 11.5–15.5)
WBC: 5.2 10*3/uL (ref 4.0–10.5)

## 2017-05-21 LAB — POCT I-STAT TROPONIN I: Troponin i, poc: 0 ng/mL (ref 0.00–0.08)

## 2017-05-21 NOTE — ED Provider Notes (Signed)
[facility]-EMERGENCY DEPT Provider Note   CSN: [id number] Arrival date & time: 05/21/17  2228  By signing my name below, I, Ny'Kea Lewis, attest that this documentation has been prepared under the direction and in the presence of Melene Plan, DO. Electronically Signed: Karren Cobble, ED Scribe. 05/22/17. 12:06 AM.  History   Chief Complaint Chief Complaint  Patient presents with  . Chest Pain   HPI Comments: Cindy Bruce is a 26 y.o. female with a history of DMII and asthma, who presents to the Emergency Department complaining of intermittent, left sided chest pain that began today. She reports associated light headedness, shortness of breath, and abdominal pain. Her pain is worsened with positional movement, which she notes then is sharp. She notes some mild shortness with extended exertion. No treatment tried. No FPMHx of cardiac events. No h/o PE/DVT. Denies BLE edema, hemoptysis, or any other acute associated symptoms at this time.   The history is provided by the patient. No language interpreter was used.   Past Medical History:  Diagnosis Date  . Asthma   . Diabetes mellitus    Type 2 since 1st pg in 2008 - on insulin   Patient Active Problem List   Diagnosis Date Noted  . Diabetes in pregnancy 01/24/2017  . DM (diabetes mellitus) in pregnancy 01/24/2017  . GBS (group B streptococcus) infection 01/21/2017  . Limited prenatal care, antepartum 12/23/2016  . Supervision of high risk pregnancy, antepartum 08/19/2016  . Diabetes mellitus complicating pregnancy, antepartum 09/11/2013  . Rh negative state in antepartum period 08/24/2013  . Rubella non-immune status, antepartum 07/11/2012   Past Surgical History:  Procedure Laterality Date  . TOOTH EXTRACTION     OB History    Gravida Para Term Preterm AB Living   4 4 4  0 0 4   SAB TAB Ectopic Multiple Live Births   0 0 0 0 4     Home Medications    Prior to Admission medications   Medication Sig Start Date End Date Taking?  Authorizing Provider  ferrous gluconate (IRON 27) 240 (27 FE) MG tablet Take 1 tablet (240 mg total) by mouth 3 (three) times daily with meals. 01/26/17   Orvilla Cornwall A, CNM  ibuprofen (ADVIL,MOTRIN) 600 MG tablet Take 1 tablet (600 mg total) by mouth every 6 (six) hours. 01/26/17   Orvilla Cornwall A, CNM  metFORMIN (GLUCOPHAGE) 1000 MG tablet Take 1 tablet (1,000 mg total) by mouth 2 (two) times daily with a meal. 01/26/17   Denney, Rachelle A, CNM  naproxen (NAPROSYN) 500 MG tablet Take 1 tablet (500 mg total) by mouth 2 (two) times daily with a meal. 03/28/17   Garlon Hatchet, PA-C  penicillin v potassium (VEETID) 500 MG tablet Take 1 tablet (500 mg total) by mouth 4 (four) times daily. 03/28/17   Garlon Hatchet, PA-C  Prenatal MV-Min-FA-Omega-3 (PRENATAL GUMMIES/DHA & FA) 0.4-32.5 MG CHEW Chew 2 each by mouth daily at 12 noon.    [provider]  senna-docusate (SENOKOT-S) 8.6-50 MG tablet Take 2 tablets by mouth at bedtime. 01/26/17   Roe Coombs, CNM   Family History Family History  Problem Relation Age of Onset  . Diabetes Mother   . Diabetes Maternal Grandmother     Social History Social History  Substance Use Topics  . Smoking status: Former Smoker    Packs/day: 1.00    Types: Cigarettes  . Smokeless tobacco: Former Neurosurgeon  . Alcohol use No     Comment:  occ    Allergies   Food and Watermelon [citrullus vulgaris]  Review of Systems Review of Systems  Constitutional: Negative for chills and fever.  HENT: Negative for congestion and rhinorrhea.   Eyes: Negative for redness and visual disturbance.  Respiratory: Positive for shortness of breath. Negative for cough (no hemopytsis. ) and wheezing.   Cardiovascular: Positive for chest pain. Negative for palpitations.  Gastrointestinal: Positive for abdominal pain. Negative for nausea and vomiting.  Genitourinary: Negative for dysuria and urgency.  Musculoskeletal: Negative for arthralgias and myalgias.  Skin:  Negative for pallor and wound.  Neurological: Positive for light-headedness. Negative for dizziness and headaches.   Physical Exam Updated Vital Signs BP 132/73 (BP Location: Left Arm)   Pulse 93   Temp 98.3 F (36.8 C) (Oral)   Resp 16   Ht 5\' 1"  (1.549 m)   Wt 72.6 kg (160 lb)   LMP 04/30/2017 (Approximate)   BMI 30.23 kg/m   Physical Exam  Constitutional: She is oriented to person, place, and time. She appears well-developed and well-nourished. No distress.  HENT:  Head: Normocephalic and atraumatic.  Eyes: EOM are normal. Pupils are equal, round, and reactive to light.  Neck: Normal range of motion. Neck supple.  Cardiovascular: Normal rate and regular rhythm.  Exam reveals no gallop and no friction rub.   No murmur heard. Pulmonary/Chest: Effort normal. She has no wheezes. She has no rales.  Abdominal: Soft. She exhibits no distension. There is no tenderness.  Mild epigastric pain.   Musculoskeletal: She exhibits no edema or tenderness.  Neurological: She is alert and oriented to person, place, and time.  Skin: Skin is warm and dry. She is not diaphoretic.  Psychiatric: She has a normal mood and affect. Her behavior is normal.  Nursing note and vitals reviewed.  ED Treatments / Results  DIAGNOSTIC STUDIES:  COORDINATION OF CARE: 11:48 PM-Discussed next steps with pt. Pt verbalized understanding and is agreeable with the plan.   Labs (all labs ordered are listed, but only abnormal results are displayed) Labs Reviewed  BASIC METABOLIC PANEL - Abnormal; Notable for the following:       Result Value   Sodium 134 (*)    CO2 20 (*)    Glucose, Bld 269 (*)    All other components within normal limits  CBC - Abnormal; Notable for the following:    Hemoglobin 11.1 (*)    HCT 32.5 (*)    MCV 74.4 (*)    MCH 25.4 (*)    RDW 16.4 (*)    All other components within normal limits  I-STAT TROPOININ, ED  POCT I-STAT TROPONIN I    EKG  EKG  Interpretation  Date/Time:  Friday May 21 2017 22:39:23 EDT Ventricular Rate:  103 PR Interval:    QRS Duration: 69 QT Interval:  342 QTC Calculation: 448 R Axis:   85 Text Interpretation:  Sinus tachycardia Probable left atrial enlargement Borderline T abnormalities, anterior leads No significant change since last tracing Confirmed by Melene Plan [phone]) on 05/21/2017 11:41:49 PM       Radiology Dg Chest 2 View  Result Date: 05/21/2017 CLINICAL DATA:  Chest pain EXAM: CHEST  2 VIEW COMPARISON:  08/12/2015 FINDINGS: The heart size and mediastinal contours are within normal limits. Both lungs are clear. The visualized skeletal structures are unremarkable. IMPRESSION: No active cardiopulmonary disease. Electronically Signed   By: Jasmine Pang M.D.   On: 05/21/2017 23:11    Procedures Procedures (including critical care  time)  Medications Ordered in ED Medications - No data to display   Initial Impression / Assessment and Plan / ED Course  I have reviewed the triage vital signs and the nursing notes.  Pertinent labs & imaging results that were available during my care of the patient were reviewed by me and considered in my medical decision making (see chart for details).     26 yo F With a chief complaints of chest pain. Going on for the past week. Denies history of MI in the family. Worse with movement palpation. Denies cough congestion fevers chills. Reproduced with movement on exam. Noticed any edema. Well-appearing and nontoxic. EKG chest x-ray troponin unremarkable. Discharge home.  4:49 AM:  I have discussed the diagnosis/risks/treatment options with the patient and family and believe the pt to be eligible for discharge home to follow-up with PCP. We also discussed returning to the ED immediately if new or worsening sx occur. We discussed the sx which are most concerning (e.g., sudden worsening pain, fever, inability to tolerate by mouth) that necessitate immediate return.  Medications administered to the patient during their visit and any new prescriptions provided to the patient are listed below.  Medications given during this visit Medications - No data to display   The patient appears reasonably screen and/or stabilized for discharge and I doubt any other medical condition or other [facility] requiring further screening, evaluation, or treatment in the ED at this time prior to discharge.    Final Clinical Impressions(s) / ED Diagnoses   Final diagnoses:  Atypical chest pain    New Prescriptions Discharge Medication List as of 05/21/2017 11:58 PM     I personally performed the services described in this documentation, which was scribed in my presence. The recorded information has been reviewed and is accurate.     Melene PlanFloyd, Cyra Spader, DO 05/22/17 [phone]

## 2017-05-21 NOTE — ED Notes (Signed)
Pt c/o 6/10 sharp pain on the left side of the chest onset 1 wk ago.

## 2017-05-21 NOTE — Discharge Instructions (Signed)
Take 4 over the counter ibuprofen tablets 3 times a day or 2 over-the-counter naproxen tablets twice a day for pain. Also take tylenol 1000mg(2 extra strength) four times a day.    

## 2017-05-21 NOTE — ED Triage Notes (Signed)
Pt is c/o intermittent dull chest pain. Onset of today, mild shortness of breath.

## 2019-11-11 ENCOUNTER — Ambulatory Visit (HOSPITAL_COMMUNITY)
Admission: EM | Admit: 2019-11-11 | Discharge: 2019-11-11 | Disposition: A | Discharge status: Home / Self Care | Payer: Self-pay | Attending: Family Medicine | Admitting: Family Medicine

## 2019-11-11 ENCOUNTER — Other Ambulatory Visit: Payer: Self-pay

## 2019-11-11 ENCOUNTER — Encounter (HOSPITAL_COMMUNITY): Payer: Self-pay

## 2019-11-11 DIAGNOSIS — Z20822 Contact with and (suspected) exposure to covid-19: Secondary | ICD-10-CM

## 2019-11-11 DIAGNOSIS — Z20828 Contact with and (suspected) exposure to other viral communicable diseases: Secondary | ICD-10-CM | POA: Insufficient documentation

## 2019-11-11 NOTE — ED Triage Notes (Signed)
Spouse is covid positive, patient has no symptoms

## 2019-11-11 NOTE — Discharge Instructions (Addendum)
If your Covid-19 test is positive, you will receive a phone call from [facility] regarding your results. Negative test results are not called. Both positive and negative results area always visible on MyChart. If you do not have a MyChart account, sign up instructions are in your discharge papers.  

## 2019-11-13 LAB — NOVEL CORONAVIRUS, NAA (HOSP ORDER, SEND-OUT TO REF LAB; TAT 18-24 HRS): SARS-CoV-2, NAA: NOT DETECTED

## 2019-11-13 NOTE — ED Provider Notes (Signed)
[facility]   [id number] 11/11/19 Arrival Time: 8786  ASSESSMENT & PLAN:  1. Close exposure to COVID-19 virus     COVID-19 testing sent. To self-quarantine until results are available.  Follow-up Information    [place] [facility] [place].   Specialty: Urgent Care Why: As needed. Contact information: [facility] [place] [phone]          Reviewed expectations re: course of current medical issues. Questions answered. Outlined signs and symptoms indicating need for more acute intervention. Patient verbalized understanding. After Visit Summary given.   SUBJECTIVE: History from: patient. Cindy Bruce is a 28 y.o. female who requests COVID-19 testing. Known COVID-19 contact: husband. Recent travel: none. Denies: runny nose, congestion, fever, cough, sore throat, difficulty breathing and headache. Normal PO intake without n/v/d.  ROS: As per HPI.   OBJECTIVE:  Vitals:   11/11/19 1614 11/11/19 1617  BP: 116/65   Pulse: (!) 106   Resp: 18   Temp: 98.3 F (36.8 C)   TempSrc: Oral   SpO2: 100%   Weight:  69.5 kg    General appearance: alert; no distress Eyes: PERRLA; EOMI; conjunctiva normal HENT: [place]; AT; nasal mucosa normal; oral mucosa normal Neck: supple  Lungs: speaks full sentences without difficulty; unlabored Heart: slight tachycardia; regular Abdomen: soft, non-tender Extremities: no edema Skin: warm and dry Neurologic: normal gait Psychological: alert and cooperative; normal mood and affect  Labs:  Labs Reviewed  NOVEL CORONAVIRUS, NAA (HOSP ORDER, SEND-OUT TO REF LAB; TAT 18-24 HRS)     Allergies  Allergen Reactions  . Food Anaphylaxis and Other (See Comments)    Pt is allergic to cantaloupe.  Marland Kitchen Watermelon [Citrullus Vulgaris] Anaphylaxis    Past Medical History:  Diagnosis Date  . Asthma   . Diabetes mellitus    Type 2 since 1st pg in 2008 - on insulin   Social History    Socioeconomic History  . Marital status: Single    Spouse name: Not on file  . Number of children: Not on file  . Years of education: Not on file  . Highest education level: Not on file  Occupational History  . Not on file  Social Needs  . Financial resource strain: Not on file  . Food insecurity    Worry: Not on file    Inability: Not on file  . Transportation needs    Medical: Not on file    Non-medical: Not on file  Tobacco Use  . Smoking status: Former Smoker    Packs/day: 1.00    Types: Cigarettes  . Smokeless tobacco: Former Network engineer and Sexual Activity  . Alcohol use: No    Comment: occ  . Drug use: No  . Sexual activity: Yes    Comment: desires BTL  Lifestyle  . Physical activity    Days per week: Not on file    Minutes per session: Not on file  . Stress: Not on file  Relationships  . Social Herbalist on phone: Not on file    Gets together: Not on file    Attends religious service: Not on file    Active member of club or organization: Not on file    Attends meetings of clubs or organizations: Not on file    Relationship status: Not on file  . Intimate partner violence    Fear of current or ex partner: Not on file    Emotionally  abused: Not on file    Physically abused: Not on file    Forced sexual activity: Not on file  Other Topics Concern  . Not on file  Social History Narrative  . Not on file   Family History  Problem Relation Age of Onset  . Diabetes Mother   . Diabetes Maternal Grandmother    Past Surgical History:  Procedure Laterality Date  . TOOTH EXTRACTION       Mardella Layman, MD 11/13/19 1046

## 2019-11-16 ENCOUNTER — Encounter (HOSPITAL_COMMUNITY): Payer: Self-pay

## 2019-11-16 ENCOUNTER — Other Ambulatory Visit: Payer: Self-pay

## 2019-11-16 ENCOUNTER — Emergency Department (HOSPITAL_COMMUNITY)
Admission: EM | Admit: 2019-11-16 | Discharge: 2019-11-16 | Disposition: A | Discharge status: Home / Self Care | Payer: Self-pay | Attending: Emergency Medicine | Admitting: Emergency Medicine

## 2019-11-16 DIAGNOSIS — Z79899 Other long term (current) drug therapy: Secondary | ICD-10-CM | POA: Insufficient documentation

## 2019-11-16 DIAGNOSIS — N898 Other specified noninflammatory disorders of vagina: Secondary | ICD-10-CM | POA: Insufficient documentation

## 2019-11-16 DIAGNOSIS — E119 Type 2 diabetes mellitus without complications: Secondary | ICD-10-CM | POA: Insufficient documentation

## 2019-11-16 DIAGNOSIS — Z87891 Personal history of nicotine dependence: Secondary | ICD-10-CM | POA: Insufficient documentation

## 2019-11-16 DIAGNOSIS — J45909 Unspecified asthma, uncomplicated: Secondary | ICD-10-CM | POA: Insufficient documentation

## 2019-11-16 LAB — URINALYSIS, ROUTINE W REFLEX MICROSCOPIC
Bacteria, UA: NONE SEEN
Bilirubin Urine: NEGATIVE
Glucose, UA: >=500 mg/dL — AB
Ketones, ur: 5 mg/dL — AB
Leukocytes,Ua: NEGATIVE
Nitrite: NEGATIVE
Protein, ur: NEGATIVE mg/dL
Specific Gravity, Urine: 1.039 — ABNORMAL HIGH (ref 1.005–1.030)
pH: 5 (ref 5.0–8.0)

## 2019-11-16 LAB — WET PREP, GENITAL
Sperm: NONE SEEN
Trich, Wet Prep: NONE SEEN
Yeast Wet Prep HPF POC: NONE SEEN

## 2019-11-16 LAB — POC URINE PREG, ED: Preg Test, Ur: NEGATIVE

## 2019-11-16 MED ORDER — CEFTRIAXONE SODIUM 250 MG IJ SOLR
250.0000 mg | Freq: Once | INTRAMUSCULAR | Status: AC
  Administered 2019-11-16: 21:00:00 250 mg via INTRAMUSCULAR
  Filled 2019-11-16: qty 250

## 2019-11-16 MED ORDER — STERILE WATER FOR INJECTION IJ SOLN
INTRAMUSCULAR | Status: AC
  Administered 2019-11-16: 21:00:00
  Filled 2019-11-16: qty 10

## 2019-11-16 MED ORDER — AZITHROMYCIN 250 MG PO TABS
1000.0000 mg | ORAL_TABLET | Freq: Once | ORAL | Status: AC
Start: 2019-11-16 — End: 2019-11-16
  Administered 2019-11-16: 21:00:00 1000 mg via ORAL
  Filled 2019-11-16: qty 4

## 2019-11-16 NOTE — ED Triage Notes (Signed)
Patient c/o dysuria, vaginal itching, vaginal discharge that is white in color , and a foul vaginal odor x 1 week.

## 2019-11-16 NOTE — ED Provider Notes (Signed)
[facility] DEPT Provider Note   CSN: [id number] Arrival date & time: 11/16/19  1720     History Chief Complaint  Patient presents with  . Dysuria  . Vaginal Itching    Cindy Bruce is a 28 y.o. female.  HPI Patient presents to the emergency department with vaginal discharge that started 1 week ago.  The patient states she is also had some vaginal itching odor as well.  Patient states that she had recent unprotected sex with a former partner.  Patient states that nothing seems make her condition better or worse.  The patient denies chest pain, shortness of breath, headache,blurred vision, neck pain, fever, cough, weakness, numbness, dizziness, anorexia, edema, abdominal pain, nausea, vomiting, diarrhea, rash, back pain, dysuria, hematemesis, bloody stool, near syncope, or syncope.    Past Medical History:  Diagnosis Date  . Asthma   . Diabetes mellitus    Type 2 since 1st pg in 2008 - on insulin    Patient Active Problem List   Diagnosis Date Noted  . Diabetes in pregnancy 01/24/2017  . DM (diabetes mellitus) in pregnancy 01/24/2017  . GBS (group B streptococcus) infection 01/21/2017  . Limited prenatal care, antepartum 12/23/2016  . Supervision of high risk pregnancy, antepartum 08/19/2016  . Diabetes mellitus complicating pregnancy, antepartum 09/11/2013  . Rh negative state in antepartum period 08/24/2013  . Rubella non-immune status, antepartum 07/11/2012    Past Surgical History:  Procedure Laterality Date  . TOOTH EXTRACTION       OB History    Gravida  4   Para  4   Term  4   Preterm  0   AB  0   Living  4     SAB  0   TAB  0   Ectopic  0   Multiple  0   Live Births  4           Family History  Problem Relation Age of Onset  . Diabetes Mother   . Diabetes Maternal Grandmother     Social History   Tobacco Use  . Smoking status: Former Smoker    Packs/day: 1.00    Types: Cigarettes  . Smokeless  tobacco: Former Network engineer Use Topics  . Alcohol use: Yes    Comment: occ  . Drug use: No    Home Medications Prior to Admission medications   Medication Sig Start Date End Date Taking? Authorizing Provider  CRANBERRY PO Take 2 capsules by mouth daily.   Yes [provider]  ferrous gluconate (IRON 27) 240 (27 FE) MG tablet Take 1 tablet (240 mg total) by mouth 3 (three) times daily with meals. 01/26/17 11/11/19  Morene Crocker, CNM  metFORMIN (GLUCOPHAGE) 1000 MG tablet Take 1 tablet (1,000 mg total) by mouth 2 (two) times daily with a meal. 01/26/17 11/11/19  Kandis Cocking A, CNM    Allergies    Food and Watermelon [citrullus vulgaris]  Review of Systems   Review of Systems All other systems negative except as documented in the HPI. All pertinent positives and negatives as reviewed in the HPI. Physical Exam Updated Vital Signs BP (!) 143/73 (BP Location: Left Arm)   Pulse (!) 106   Temp 98.3 F (36.8 C) (Oral)   Resp 16   Ht 5\' 1"  (1.549 m)   Wt 69.4 kg   LMP 11/04/2019   SpO2 100%   BMI 28.91 kg/m   Physical Exam Vitals and nursing note  reviewed.  Constitutional:      General: She is not in acute distress.    Appearance: She is well-developed.  HENT:     Head: Normocephalic and atraumatic.  Eyes:     Pupils: Pupils are equal, round, and reactive to light.  Cardiovascular:     Rate and Rhythm: Normal rate and regular rhythm.     Heart sounds: Normal heart sounds. No murmur. No friction rub. No gallop.   Pulmonary:     Effort: Pulmonary effort is normal. No respiratory distress.     Breath sounds: Normal breath sounds. No wheezing.  Abdominal:     General: Bowel sounds are normal. There is no distension.     Palpations: Abdomen is soft.     Tenderness: There is no abdominal tenderness.  Genitourinary:    Labia:        Right: No rash or tenderness.        Left: No rash or tenderness.      Vagina: Vaginal discharge present.     Cervix: No  cervical motion tenderness.     Adnexa:        Right: Tenderness present.        Left: Tenderness present.   Musculoskeletal:     Cervical back: Normal range of motion and neck supple.  Skin:    General: Skin is warm and dry.     Capillary Refill: Capillary refill takes less than 2 seconds.     Findings: No erythema or rash.  Neurological:     Mental Status: She is alert and oriented to person, place, and time.     Motor: No abnormal muscle tone.     Coordination: Coordination normal.  Psychiatric:        Behavior: Behavior normal.     ED Results / Procedures / Treatments   Labs (all labs ordered are listed, but only abnormal results are displayed) Labs Reviewed  WET PREP, GENITAL - Abnormal; Notable for the following components:      Result Value   Clue Cells Wet Prep HPF POC PRESENT (*)    WBC, Wet Prep HPF POC PRESENT (*)    All other components within normal limits  URINALYSIS, ROUTINE W REFLEX MICROSCOPIC - Abnormal; Notable for the following components:   Color, Urine STRAW (*)    Specific Gravity, Urine 1.039 (*)    Glucose, UA >=500 (*)    Hgb urine dipstick SMALL (*)    Ketones, ur 5 (*)    All other components within normal limits  POC URINE PREG, ED  GC/CHLAMYDIA PROBE AMP ([place]) NOT AT [facility]    EKG None  Radiology No results found.  Procedures Procedures (including critical care time)  Medications Ordered in ED Medications  cefTRIAXone (ROCEPHIN) injection 250 mg (has no administration in time range)  azithromycin (ZITHROMAX) tablet 1,000 mg (has no administration in time range)    ED Course  I have reviewed the triage vital signs and the nursing notes.  Pertinent labs & imaging results that were available during my care of the patient were reviewed by me and considered in my medical decision making (see chart for details).    MDM Rules/Calculators/A&P     CHA2DS2/VAS Stroke Risk Points      N/A >= 2 Points: High Risk  1 - 1.99 Points:  Medium Risk  0 Points: Low Risk    A final score could not be computed because of missing components.: Last  Change: N/A  This score determines the patient's risk of having a stroke if the  patient has atrial fibrillation.      This score is not applicable to this patient. Components are not  calculated.                   Patient be treated for STDs and advised to follow-up with her doctor.  Told to return here as needed.  Patient agrees the plan and all questions were answered. Final Clinical Impression(s) / ED Diagnoses Final diagnoses:  None    Rx / DC Orders ED Discharge Orders    None       Kyra MangesLawyer, Branndon Tuite, PA-C 11/16/19 2027    Lorre NickAllen, Anthony, MD 11/16/19 2302

## 2019-11-16 NOTE — Discharge Instructions (Addendum)
Return here as needed.  Follow-up with your primary doctor. °

## 2019-11-20 LAB — GC/CHLAMYDIA PROBE AMP (~~LOC~~) NOT AT ARMC
Chlamydia: NEGATIVE
Neisseria Gonorrhea: NEGATIVE

## 2019-12-27 ENCOUNTER — Ambulatory Visit: Payer: Self-pay | Admitting: Obstetrics and Gynecology

## 2020-01-24 ENCOUNTER — Ambulatory Visit: Payer: Self-pay | Admitting: Advanced Practice Midwife

## 2020-02-23 ENCOUNTER — Ambulatory Visit: Payer: Self-pay | Admitting: Nurse Practitioner

## 2020-10-29 ENCOUNTER — Encounter: Payer: Self-pay | Admitting: Certified Nurse Midwife

## 2020-10-29 ENCOUNTER — Other Ambulatory Visit: Payer: Self-pay

## 2020-10-29 ENCOUNTER — Other Ambulatory Visit (HOSPITAL_COMMUNITY)
Admission: RE | Admit: 2020-10-29 | Discharge: 2020-10-29 | Disposition: A | Discharge status: Home / Self Care | Payer: Medicaid Other | Source: Ambulatory Visit | Attending: Certified Nurse Midwife | Admitting: Certified Nurse Midwife

## 2020-10-29 ENCOUNTER — Ambulatory Visit (INDEPENDENT_AMBULATORY_CARE_PROVIDER_SITE_OTHER): Payer: Medicaid Other | Admitting: Certified Nurse Midwife

## 2020-10-29 VITALS — BP 110/74 | HR 90 | Ht 61.0 in | Wt 148.0 lb

## 2020-10-29 DIAGNOSIS — N898 Other specified noninflammatory disorders of vagina: Secondary | ICD-10-CM

## 2020-10-29 DIAGNOSIS — Z01419 Encounter for gynecological examination (general) (routine) without abnormal findings: Secondary | ICD-10-CM | POA: Diagnosis not present

## 2020-10-29 DIAGNOSIS — Z113 Encounter for screening for infections with a predominantly sexual mode of transmission: Secondary | ICD-10-CM

## 2020-10-29 DIAGNOSIS — Z30016 Encounter for initial prescription of transdermal patch hormonal contraceptive device: Secondary | ICD-10-CM

## 2020-10-29 DIAGNOSIS — Z30432 Encounter for removal of intrauterine contraceptive device: Secondary | ICD-10-CM | POA: Diagnosis not present

## 2020-10-29 MED ORDER — NORELGESTROMIN-ETH ESTRADIOL 150-35 MCG/24HR TD PTWK: 1.0000 | MEDICATED_PATCH | TRANSDERMAL | 12 refills | Status: DC

## 2020-10-29 NOTE — Progress Notes (Signed)
GYN presents for AEX/PAP/STD Screening.  Removal of IUD and Nexplanon Insertion.  PHQ-9=1

## 2020-10-29 NOTE — Progress Notes (Signed)
GYNECOLOGY ANNUAL PREVENTATIVE CARE ENCOUNTER NOTE  History:     Cindy Bruce is a 29 y.o. [phone] female here for a routine annual gynecologic exam.  Current complaints: AUB with IUD, wants IUD removed.   Denies abnormal discharge, pelvic pain, problems with intercourse or other gynecologic concerns. Patient request STD screening today.    Gynecologic History Patient's last menstrual period was 10/11/2020 (approximate). Contraception: IUD Last Pap: 09/2013. Results were: normal with negative HPV  Obstetric History OB History  Gravida Para Term Preterm AB Living  4 4 4  0 0 4  SAB TAB Ectopic Multiple Live Births  0 0 0 0 4    # Outcome Date GA Lbr Len/2nd Weight Sex Delivery Anes PTL Lv  4 Term 01/24/17 [redacted]w[redacted]d 16:31 / 00:08 7 lb 4.1 oz (3.29 kg) F Vag-Spont EPI  LIV  3 Term 02/26/14 [redacted]w[redacted]d 08:51 / 00:40 6 lb 12.2 oz (3.067 kg) M Vag-Spont EPI  LIV  2 Term 01/06/13 [redacted]w[redacted]d 12:38 / 00:21 5 lb 7.6 oz (2.483 kg) M Vag-Spont EPI  LIV     Birth Comments: Mother denies complication during delivery  1 Term 10/16/07 [redacted]w[redacted]d  6 lb 7 oz (2.92 kg) F Vag-Spont EPI N LIV    Past Medical History:  Diagnosis Date  . Asthma   . 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 TOOTH EXTRACTION      Current Outpatient Medications on File Prior to Visit  Medication Sig Dispense Refill  . CRANBERRY PO Take 2 capsules by mouth daily. (Patient not taking: Reported on 10/29/2020)    . [DISCONTINUED] ferrous gluconate (IRON 27) 240 (27 FE) MG tablet Take 1 tablet (240 mg total) by mouth 3 (three) times daily with meals. 120 tablet 2  . [DISCONTINUED] metFORMIN (GLUCOPHAGE) 1000 MG tablet Take 1 tablet (1,000 mg total) by mouth 2 (two) times daily with a meal. 60 tablet 2   No current facility-administered medications on file prior to visit.    Allergies  Allergen Reactions  . 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 Her smoking use included cigarettes. She smoked 1.00 pack per day. She has quit using smokeless tobacco. She reports current alcohol use. She reports that she does not use drugs.  Family History  Problem Relation Age of Onset  . Diabetes Mother   . Diabetes Maternal Grandmother     The following portions of the patient's history were reviewed and updated as appropriate: allergies, current medications, past family history, past medical history, past social history, past surgical history and problem list.  Review of Systems Pertinent items noted in HPI and remainder of comprehensive ROS otherwise negative.  Physical Exam:  BP 110/74   Pulse 90   Ht 5\' 1"  (1.549 m)   Wt 148 lb (67.1 kg)   LMP 10/11/2020 (Approximate)   BMI 27.96 kg/m  CONSTITUTIONAL: Well-developed, well-nourished female in no acute distress.  HENT:  Normocephalic, atraumatic, External right and left ear normal.  EYES: Conjunctivae and EOM are normal. Pupils are equal, round, and reactive to light.  NECK: Normal range of motion, supple, no masses.  Normal thyroid.  SKIN: Skin is warm and dry. No rash noted. Not diaphoretic. No erythema. No pallor. MUSCULOSKELETAL: Normal  range of motion. No tenderness.  No cyanosis, clubbing, or edema.   NEUROLOGIC: Alert and oriented to person, place, and time. Normal reflexes, muscle tone coordination.  PSYCHIATRIC: Normal mood and affect. Normal behavior. Normal judgment and thought content. CARDIOVASCULAR: Normal heart rate noted, regular rhythm RESPIRATORY: Clear to auscultation bilaterally. Effort and breath sounds normal, no problems with respiration noted. BREASTS: Symmetric in size. No masses, tenderness, skin changes, nipple drainage, or lymphadenopathy bilaterally. Performed in the presence of a chaperone. ABDOMEN: Soft, no distention noted.  No  tenderness, rebound or guarding.  PELVIC: Normal appearing external genitalia and urethral meatus; normal appearing vaginal mucosa and cervix. Cervix retroverted. small amount of white curdy discharge present.  Pap smear obtained. IUD string seen.  Normal uterine size, no other palpable masses, no uterine or adnexal tenderness.  Performed in the presence of a chaperone.     PROCEDURES:  IUD Removal  Patient was in the dorsal lithotomy position, normal external genitalia was noted.  A speculum was placed in the patient's vagina, normal discharge was noted, no lesions. The multiparous cervix was visualized, no lesions, no abnormal discharge.  The strings of the IUD were grasped and pulled using ring forceps. The IUD was removed in its entirety. Patient tolerated the procedure well.    Patient will use patches for contraception.  Routine preventative health maintenance measures emphasized.  Assessment and Plan:    1. Women's annual routine gynecological examination - normal well woman examination  - Cytology - PAP( [place]) - TSH - HgB A1c  2. Encounter for IUD removal - see above procedure note  - patient desires removal due to AUB with IUD and pain during intercourse   3. Screening for STDs (sexually transmitted diseases) - Patient request STD screening today  - Hepatitis C Antibody - Hepatitis B surface antigen - RPR - HIV antibody (with reflex) - Cervicovaginal ancillary only( [place])  4. Vaginal discharge - small amount of white curdy discharge present, most likely yeast infection  - Cervicovaginal ancillary only( [place])  5. Encounter for initial prescription of transdermal patch hormonal contraceptive device - Educated and discussed birth control options in detail with patient  - Patient desires to proceed with patches today, discussed with patient increase risk for DVTs due to smoking, according to [place] [facility] 2 in patient <35yo and smokes.  -  norelgestromin-ethinyl estradiol (ORTHO EVRA) 150-35 MCG/24HR transdermal patch; Place 1 patch onto the skin once a week.  Dispense: 3 patch; Refill: 12  Will follow up results of pap smear/STD screening and manage accordingly. Routine preventative health maintenance measures emphasized. Please refer to After Visit Summary for other counseling recommendations.      Sharyon Cable, CNM Center for [employer], [facility]

## 2020-10-29 NOTE — Patient Instructions (Signed)
Ethinyl Estradiol; Norelgestromin skin patches What is this medicine? ETHINYL ESTRADIOL;NORELGESTROMIN (ETH in il es tra DYE ole; nor el JES troe min) skin patch is used as a contraceptive (birth control method). This medicine combines two types of female hormones, an estrogen and a progestin. This patch is used to prevent ovulation and pregnancy. This medicine may be used for other purposes; ask your health care provider or pharmacist if you have questions. COMMON BRAND NAME(S): Ortho Evra, Xulane What should I tell my health care provider before I take this medicine? They need to know if you have or ever had any of these conditions:  abnormal vaginal bleeding  blood vessel disease or blood clots  breast, cervical, endometrial, ovarian, liver, or uterine cancer  diabetes  gallbladder disease  having surgery  heart disease or recent heart attack  high blood pressure  high cholesterol or triglycerides  history of irregular heartbeat or heart valve problems  kidney disease  liver disease  migraine headaches  protein C deficiency  protein S deficiency  recently had a baby, miscarriage, or abortion  stroke  systemic lupus erythematosus (SLE)  tobacco smoker  an unusual or allergic reaction to estrogens, progestins, other medicines, foods, dyes, or preservatives  pregnant or trying to get pregnant  breast-feeding How should I use this medicine? This patch is applied to the skin. Follow the directions on the prescription label. Apply to clean, dry, healthy skin on the buttock, abdomen, upper outer arm or upper torso, in a place where it will not be rubbed by tight clothing. Do not use lotions or other cosmetics on the site where the patch will go. Press the patch firmly in place for 10 seconds to ensure good contact with the skin. Change the patch every 7 days on the same day of the week for 3 weeks. You will then have a break from the patch for 1 week, after which you  will apply a new patch. Do not use your medicine more often than directed. Contact your pediatrician regarding the use of this medicine in children. Special care may be needed. This medicine has been used in female children who have started having menstrual periods. A patient package insert for the product will be given with each prescription and refill. Read this sheet carefully each time. The sheet may change frequently. Overdosage: If you think you have taken too much of this medicine contact a poison control center or emergency room at once. NOTE: This medicine is only for you. Do not share this medicine with others. What if I miss a dose? You will need to replace your patch once a week as directed. If your patch is lost or falls off, contact your health care professional for advice. You may need to use another form of birth control if your patch has been off for more than 1 day. What may interact with this medicine? Do not take this medicine with the following medications:  dasabuvir; ombitasvir; paritaprevir; ritonavir  ombitasvir; paritaprevir; ritonavir This medicine may also interact with the following medications:  acetaminophen  antibiotics or medicines for infections, especially rifampin, rifabutin, rifapentine, and possibly penicillins or tetracyclines  aprepitant or fosaprepitant  armodafinil  ascorbic acid (vitamin C)  barbiturate medicines, such as phenobarbital or primidone  bosentan  certain antiviral medicines for hepatitis, HIV or AIDS  certain medicines for cancer treatment  certain medicines for seizures like carbamazepine, clobazam, felbamate, lamotrigine, oxcarbazepine, phenytoin, rufinamide, topiramate  certain medicines for treating high cholesterol  cyclosporine    dantrolene  elagolix  flibanserin  grapefruit juice  lesinurad  medicines for diabetes  medicines to treat fungal infections, such as griseofulvin, miconazole, fluconazole,  ketoconazole, itraconazole, posaconazole or voriconazole  mifepristone  mitotane  modafinil  morphine  mycophenolate  St. John's wort  tamoxifen  temazepam  theophylline or aminophylline  thyroid hormones  tizanidine  tranexamic acid  ulipristal  warfarin This list may not describe all possible interactions. Give your health care provider a list of all the medicines, herbs, non-prescription drugs, or dietary supplements you use. Also tell them if you smoke, drink alcohol, or use illegal drugs. Some items may interact with your medicine. What should I watch for while using this medicine? Visit your doctor or health care professional for regular checks on your progress. You will need a regular breast and pelvic exam and Pap smear while on this medicine. Use an additional method of contraception during the first cycle that you use this patch. If you have any reason to think you are pregnant, stop using this medicine right away and contact your doctor or health care professional. If you are using this medicine for hormone related problems, it may take several cycles of use to see improvement in your condition. Smoking increases the risk of getting a blood clot or having a stroke while you are using hormonal birth control, especially if you are more than 29 years old. You are strongly advised not to smoke. This medicine can make your body retain fluid, making your fingers, hands, or ankles swell. Your blood pressure can go up. Contact your doctor or health care professional if you feel you are retaining fluid. This medicine can make you more sensitive to the sun. Keep out of the sun. If you cannot avoid being in the sun, wear protective clothing and use sunscreen. Do not use sun lamps or tanning beds/booths. If you wear contact lenses and notice visual changes, or if the lenses begin to feel uncomfortable, consult your eye care specialist. In some women, tenderness, swelling, or  minor bleeding of the gums may occur. Notify your dentist if this happens. Brushing and flossing your teeth regularly may help limit this. See your dentist regularly and inform your dentist of the medicines you are taking. If you are going to have elective surgery or a MRI, you may need to stop using this medicine before the surgery or MRI. Consult your health care professional for advice. This medicine does not protect you against HIV infection (AIDS) or any other sexually transmitted diseases. What side effects may I notice from receiving this medicine? Side effects that you should report to your doctor or health care professional as soon as possible:  allergic reactions such as skin rash or itching, hives, swelling of the lips, mouth, tongue, or throat  breast tissue changes or discharge  dark patches of skin on your forehead, cheeks, upper lip, and chin  depression  high blood pressure  migraines or severe, sudden headaches  missed menstrual periods  signs and symptoms of a blood clot such as breathing problems; changes in vision; chest pain; severe, sudden headache; pain, swelling, warmth in the leg; trouble speaking; sudden numbness or weakness of the face, arm or leg  skin reactions at the patch site such as blistering, bleeding, itching, rash, or swelling  stomach pain  yellowing of the eyes or skin Side effects that usually do not require medical attention (report these to your doctor or health care professional if they continue or are bothersome):    breast tenderness  irregular vaginal bleeding or spotting, particularly during the first 3 months of use  headache  nausea  painful menstrual periods  skin redness or mild irritation at site where applied  weight gain (slight) This list may not describe all possible side effects. Call your doctor for medical advice about side effects. You may report side effects to FDA at 1-800-FDA-1088. Where should I keep my  medicine? Keep out of the reach of children. Store at room temperature between 15 and 30 degrees C (59 and 86 degrees F). Keep the patch in its pouch until time of use. Throw away any unused medicine after the expiration date. Dispose of used patches properly. Since a used patch may still contain active hormones, fold the patch in half so that it sticks to itself prior to disposal. Throw away in a place where children or pets cannot reach. NOTE: This sheet is a summary. It may not cover all possible information. If you have questions about this medicine, talk to your doctor, pharmacist, or health care provider.  2020 Elsevier/Gold Standard (2019-02-28 11:56:29)  

## 2020-10-30 LAB — CERVICOVAGINAL ANCILLARY ONLY
Bacterial Vaginitis (gardnerella): NEGATIVE
Candida Glabrata: POSITIVE — AB
Candida Vaginitis: NEGATIVE
Chlamydia: NEGATIVE
Comment: NEGATIVE
Comment: NEGATIVE
Comment: NEGATIVE
Comment: NEGATIVE
Comment: NEGATIVE
Comment: NORMAL
Neisseria Gonorrhea: NEGATIVE
Trichomonas: NEGATIVE

## 2020-10-30 LAB — HEMOGLOBIN A1C
Est. average glucose Bld gHb Est-mCnc: 295 mg/dL
Hgb A1c MFr Bld: 11.9 % — ABNORMAL HIGH (ref 4.8–5.6)

## 2020-10-30 LAB — RPR: RPR Ser Ql: NONREACTIVE

## 2020-10-30 LAB — HEPATITIS C ANTIBODY: Hep C Virus Ab: <0.1 s/co ratio (ref 0.0–0.9)

## 2020-10-30 LAB — TSH: TSH: 0.984 u[IU]/mL (ref 0.450–4.500)

## 2020-10-30 LAB — HEPATITIS B SURFACE ANTIGEN: Hepatitis B Surface Ag: NEGATIVE

## 2020-10-30 LAB — HIV ANTIBODY (ROUTINE TESTING W REFLEX): HIV Screen 4th Generation wRfx: NONREACTIVE

## 2020-11-03 MED ORDER — FLUCONAZOLE 150 MG PO TABS: 150.0000 mg | ORAL_TABLET | Freq: Every day | ORAL | 1 refills | Status: DC

## 2020-11-03 NOTE — Addendum Note (Signed)
Addended by: Sharyon Cable on: 11/03/2020 09:59 PM   Modules accepted: Orders

## 2020-11-04 ENCOUNTER — Telehealth: Payer: Self-pay

## 2020-11-04 LAB — CYTOLOGY - PAP: Diagnosis: NEGATIVE

## 2020-11-04 NOTE — Telephone Encounter (Signed)
Patient will be referred to community health and wellness.

## 2020-11-04 NOTE — Telephone Encounter (Signed)
Call patient to inform her of test results. I have advised patient I will refer her to [facility] and wellness for management of her diabetes.

## 2020-11-18 ENCOUNTER — Telehealth: Payer: Self-pay

## 2020-11-18 NOTE — Telephone Encounter (Signed)
Returned call, no answer, left vm. Pt wants to switch from patch to [facility] pills, routed to provider.

## 2020-11-20 ENCOUNTER — Other Ambulatory Visit: Payer: Self-pay | Admitting: Certified Nurse Midwife

## 2020-11-20 DIAGNOSIS — Z30011 Encounter for initial prescription of contraceptive pills: Secondary | ICD-10-CM

## 2020-11-20 MED ORDER — NORGESTIMATE-ETH ESTRADIOL 0.25-35 MG-MCG PO TABS: 1.0000 | ORAL_TABLET | Freq: Every day | ORAL | 3 refills | Status: DC

## 2020-11-20 NOTE — Telephone Encounter (Signed)
Called patient to discuss change in birth control. Patient reports that she wants to change from patch to pills. Patient reports that she does not like that patch continues to come off while showering and that cycle is irregular with patch- was on cycle 2 weeks ago and now is back on cycle.   Educated and discussed with patient prescribing birth control pills. Discussed that cycles can continue to be irregular with this switch but if irregular in February then to call office. Patient verbalizes understanding.   Sharyon Cable, CNM 11/20/20, 2:59 PM

## 2021-08-29 ENCOUNTER — Other Ambulatory Visit (HOSPITAL_COMMUNITY)
Admission: RE | Admit: 2021-08-29 | Discharge: 2021-08-29 | Disposition: A | Discharge status: Home / Self Care | Payer: Medicaid Other | Source: Ambulatory Visit | Attending: Obstetrics and Gynecology | Admitting: Obstetrics and Gynecology

## 2021-08-29 ENCOUNTER — Ambulatory Visit (INDEPENDENT_AMBULATORY_CARE_PROVIDER_SITE_OTHER): Payer: Medicaid Other

## 2021-08-29 ENCOUNTER — Other Ambulatory Visit: Payer: Self-pay

## 2021-08-29 DIAGNOSIS — R300 Dysuria: Secondary | ICD-10-CM | POA: Diagnosis not present

## 2021-08-29 DIAGNOSIS — Z113 Encounter for screening for infections with a predominantly sexual mode of transmission: Secondary | ICD-10-CM

## 2021-08-29 DIAGNOSIS — N898 Other specified noninflammatory disorders of vagina: Secondary | ICD-10-CM

## 2021-08-29 LAB — POCT URINALYSIS DIPSTICK
Bilirubin, UA: NEGATIVE
Blood, UA: NEGATIVE
Glucose, UA: NEGATIVE
Ketones, UA: NEGATIVE
Leukocytes, UA: NEGATIVE
Nitrite, UA: NEGATIVE
Protein, UA: NEGATIVE
Spec Grav, UA: 1.015 (ref 1.010–1.025)
Urobilinogen, UA: 0.2 E.U./dL
pH, UA: 6 (ref 5.0–8.0)

## 2021-08-29 NOTE — Progress Notes (Signed)
SUBJECTIVE:  30 y.o. female complains of white vaginal discharge, dysuria, and requests all STD testing.   Denies abnormal vaginal bleeding. Denies history of known exposure to STD.  OBJECTIVE:  She appears well, afebrile. Urine dipstick: negative for all components.  ASSESSMENT:  Vaginal Discharge   PLAN:  GC, chlamydia, trichomonas, BVAG, CVAG probe sent to lab. Treatment: To be determined once lab results are reviewed by the provider ROV prn if symptoms persist or worsen.

## 2021-08-30 LAB — RPR: RPR Ser Ql: NONREACTIVE

## 2021-08-30 LAB — HIV ANTIBODY (ROUTINE TESTING W REFLEX): HIV Screen 4th Generation wRfx: NONREACTIVE

## 2021-08-30 LAB — HEPATITIS C ANTIBODY: Hep C Virus Ab: <0.1 s/co ratio (ref 0.0–0.9)

## 2021-08-30 LAB — HEPATITIS B SURFACE ANTIGEN: Hepatitis B Surface Ag: NEGATIVE

## 2021-09-01 ENCOUNTER — Encounter: Payer: Self-pay | Admitting: *Deleted

## 2021-09-01 LAB — CERVICOVAGINAL ANCILLARY ONLY
Bacterial Vaginitis (gardnerella): POSITIVE — AB
Candida Glabrata: POSITIVE — AB
Candida Vaginitis: NEGATIVE
Chlamydia: POSITIVE — AB
Comment: NEGATIVE
Comment: NEGATIVE
Comment: NEGATIVE
Comment: NEGATIVE
Comment: NEGATIVE
Comment: NORMAL
Neisseria Gonorrhea: NEGATIVE
Trichomonas: NEGATIVE

## 2021-09-01 MED ORDER — DOXYCYCLINE HYCLATE 100 MG PO CAPS: 100.0000 mg | ORAL_CAPSULE | Freq: Two times a day (BID) | ORAL | 0 refills | Status: DC

## 2021-09-01 MED ORDER — METRONIDAZOLE 500 MG PO TABS: 500.0000 mg | ORAL_TABLET | Freq: Two times a day (BID) | ORAL | 0 refills | Status: DC

## 2021-09-01 MED ORDER — FLUCONAZOLE 150 MG PO TABS: 150.0000 mg | ORAL_TABLET | Freq: Once | ORAL | 0 refills | Status: AC

## 2021-09-01 NOTE — Progress Notes (Signed)
TC to inform patient of chlamydia, yeast and BV and of RX times 3.  Instructed on informing partner, treatment of partner and abstinence from sex for 7 days after treatment. Patient verbalized understanding. Additional patient education sent via MyChart with patients permission.

## 2021-09-01 NOTE — Addendum Note (Signed)
Addended by: Catalina Antigua on: 09/01/2021 01:43 PM   Modules accepted: Orders

## 2021-09-01 NOTE — Progress Notes (Signed)
Patient was assessed and managed by nursing staff during this encounter. I have reviewed the chart and agree with the documentation and plan. I have also made any necessary editorial changes.  Catalina Antigua, MD 09/01/2021 8:20 AM

## 2021-09-03 LAB — URINE CULTURE

## 2021-12-25 ENCOUNTER — Ambulatory Visit: Payer: Medicaid Other | Admitting: Obstetrics

## 2022-02-22 ENCOUNTER — Other Ambulatory Visit: Payer: Self-pay | Admitting: Obstetrics and Gynecology

## 2022-02-22 DIAGNOSIS — N898 Other specified noninflammatory disorders of vagina: Secondary | ICD-10-CM

## 2022-04-07 ENCOUNTER — Encounter: Payer: Self-pay | Admitting: Radiology

## 2022-04-09 ENCOUNTER — Encounter: Payer: Medicaid Other | Admitting: Radiology

## 2022-04-09 DIAGNOSIS — Z0289 Encounter for other administrative examinations: Secondary | ICD-10-CM

## 2022-04-28 ENCOUNTER — Encounter: Payer: Medicaid Other | Admitting: Radiology

## 2022-05-12 ENCOUNTER — Ambulatory Visit: Payer: Medicaid Other | Admitting: Obstetrics

## 2022-05-22 ENCOUNTER — Ambulatory Visit: Payer: Medicaid Other | Admitting: Obstetrics

## 2022-07-13 ENCOUNTER — Encounter: Payer: Medicaid Other | Admitting: Radiology

## 2022-07-16 ENCOUNTER — Encounter: Payer: Medicaid Other | Admitting: Radiology

## 2023-11-15 ENCOUNTER — Telehealth: Payer: Self-pay | Admitting: *Deleted

## 2023-11-15 ENCOUNTER — Other Ambulatory Visit: Payer: Self-pay | Admitting: Family Medicine

## 2023-11-15 ENCOUNTER — Other Ambulatory Visit: Payer: Self-pay

## 2023-11-15 ENCOUNTER — Inpatient Hospital Stay (HOSPITAL_COMMUNITY): Payer: Medicaid Other

## 2023-11-15 ENCOUNTER — Inpatient Hospital Stay (HOSPITAL_COMMUNITY)
Admission: AD | Admit: 2023-11-15 | Discharge: 2023-11-15 | Disposition: A | Discharge status: Home / Self Care | Payer: Medicaid Other | Attending: Obstetrics and Gynecology | Admitting: Obstetrics and Gynecology

## 2023-11-15 ENCOUNTER — Telehealth: Payer: Self-pay

## 2023-11-15 ENCOUNTER — Encounter (HOSPITAL_COMMUNITY): Payer: Self-pay | Admitting: Obstetrics and Gynecology

## 2023-11-15 DIAGNOSIS — O24111 Pre-existing diabetes mellitus, type 2, in pregnancy, first trimester: Secondary | ICD-10-CM | POA: Insufficient documentation

## 2023-11-15 DIAGNOSIS — O209 Hemorrhage in early pregnancy, unspecified: Secondary | ICD-10-CM | POA: Diagnosis present

## 2023-11-15 DIAGNOSIS — B9689 Other specified bacterial agents as the cause of diseases classified elsewhere: Secondary | ICD-10-CM | POA: Diagnosis not present

## 2023-11-15 DIAGNOSIS — O99011 Anemia complicating pregnancy, first trimester: Secondary | ICD-10-CM | POA: Insufficient documentation

## 2023-11-15 DIAGNOSIS — Z3A01 Less than 8 weeks gestation of pregnancy: Secondary | ICD-10-CM | POA: Insufficient documentation

## 2023-11-15 DIAGNOSIS — Z7984 Long term (current) use of oral hypoglycemic drugs: Secondary | ICD-10-CM | POA: Insufficient documentation

## 2023-11-15 DIAGNOSIS — O23591 Infection of other part of genital tract in pregnancy, first trimester: Secondary | ICD-10-CM | POA: Diagnosis not present

## 2023-11-15 LAB — CBC
HCT: 29.6 % — ABNORMAL LOW (ref 36.0–46.0)
Hemoglobin: 8.8 g/dL — ABNORMAL LOW (ref 12.0–15.0)
MCH: 18 pg — ABNORMAL LOW (ref 26.0–34.0)
MCHC: 29.7 g/dL — ABNORMAL LOW (ref 30.0–36.0)
MCV: 60.5 fL — ABNORMAL LOW (ref 80.0–100.0)
Platelets: 354 10*3/uL (ref 150–400)
RBC: 4.89 MIL/uL (ref 3.87–5.11)
RDW: 21.3 % — ABNORMAL HIGH (ref 11.5–15.5)
WBC: 6.5 10*3/uL (ref 4.0–10.5)
nRBC: 0 % (ref 0.0–0.2)

## 2023-11-15 LAB — URINALYSIS, ROUTINE W REFLEX MICROSCOPIC
Bilirubin Urine: NEGATIVE
Glucose, UA: 50 mg/dL — AB
Hgb urine dipstick: NEGATIVE
Ketones, ur: 20 mg/dL — AB
Leukocytes,Ua: NEGATIVE
Nitrite: NEGATIVE
Protein, ur: NEGATIVE mg/dL
Specific Gravity, Urine: 1.019 (ref 1.005–1.030)
pH: 5 (ref 5.0–8.0)

## 2023-11-15 LAB — HCG, QUANTITATIVE, PREGNANCY: hCG, Beta Chain, Quant, S: 38037 m[IU]/mL — ABNORMAL HIGH (ref <5)

## 2023-11-15 LAB — POCT PREGNANCY, URINE: Preg Test, Ur: POSITIVE — AB

## 2023-11-15 LAB — WET PREP, GENITAL
Sperm: NONE SEEN
Trich, Wet Prep: NONE SEEN
WBC, Wet Prep HPF POC: >=10 — AB (ref <10)
Yeast Wet Prep HPF POC: NONE SEEN

## 2023-11-15 MED ORDER — PREPLUS 27-1 MG PO TABS: 1.0000 | ORAL_TABLET | Freq: Every day | ORAL | 13 refills | Status: AC

## 2023-11-15 MED ORDER — FERROUS SULFATE 325 (65 FE) MG PO TBEC: 325.0000 mg | DELAYED_RELEASE_TABLET | Freq: Two times a day (BID) | ORAL | 3 refills | Status: AC

## 2023-11-15 MED ORDER — METRONIDAZOLE 500 MG PO TABS: 500.0000 mg | ORAL_TABLET | Freq: Two times a day (BID) | ORAL | 0 refills | Status: DC

## 2023-11-15 NOTE — Discharge Instructions (Signed)
[place] for [employer] at [employer] [facility]             [address] [place], [place] [address] [place] for [employer] at [place], [address], [place], [place], [address] [phone]  Center for [facility] at [place], [place], [place], [place], [address] [phone]  Center for [facility] at [facility] [address] [place], [place], [place], [address] [phone]  Center for [place] at [facility]                                 [place], [place], [place], [address] [phone]  Center for [place] at [facility]                                    [address] [place], [place], [address] [place] for [place] at [facility] [address] [place], [place], [place], [address]                              [facility] [place], [place], [place], [place], [address] [phone]  [facility]         Phone: [phone]  [facility]      Phone: [facility]      Phone: [place] [facility]         Phone: [phone]   [employer] [facility]-M[facility]    Phone: [place]      Phone: [phone]  [facility] [place]     Phone: [phone]  [facility]        Phone: [phone]  [facility] ([place]) [phone]  [facility]      Phone: [phone]                        Safe Medications in Pregnancy    Acne: Benzoyl Peroxide Salicylic Acid  Backache/Headache: Tylenol: 2 regular strength every 4 hours OR               2 Extra strength every 6 hours  Colds/Coughs/Allergies: Benadryl (alcohol free) 25 mg every 6 hours as needed Breath right strips Claritin Cepacol throat lozenges Chloraseptic throat spray Cold-Eeze- up to three times per day Cough drops, alcohol free Flonase (by prescription only) Guaifenesin Mucinex Robitussin DM (plain only, alcohol free) Saline nasal spray/drops Sudafed (pseudoephedrine) & Actifed ** use only after [redacted] weeks gestation and if you do not have high blood pressure Tylenol Vicks Vaporub  Zinc lozenges Zyrtec   Constipation: Colace Ducolax suppositories Fleet enema Glycerin suppositories Metamucil Milk of magnesia Miralax Senokot Smooth move tea  Diarrhea: Kaopectate Imodium A-D  *NO pepto Bismol  Hemorrhoids: Anusol Anusol HC Preparation H Tucks  Indigestion: Tums Maalox Mylanta Zantac  Pepcid  Insomnia: Benadryl (alcohol free) '25mg'$  every 6 hours as needed Tylenol PM Unisom, no Gelcaps  Leg Cramps: Tums MagGel  Nausea/Vomiting:  Bonine Dramamine Emetrol Ginger extract Sea bands Meclizine  Nausea medication to take during pregnancy:  Unisom (doxylamine succinate 25 mg tablets) Take one tablet daily at bedtime. If symptoms are not adequately controlled, the dose can be increased to a maximum recommended dose of two tablets daily (1/2 tablet in the morning, 1/2 tablet mid-afternoon and one at bedtime). Vitamin B6 '100mg'$  tablets. Take one tablet twice a day (up to 200 mg per day).  Skin Rashes: Aveeno products Benadryl cream or '25mg'$  every 6 hours as needed Calamine Lotion 1% cortisone cream  Yeast infection: Gyne-lotrimin 7 Monistat 7   **If taking multiple medications, please check labels to avoid duplicating the same active ingredients **take medication as directed on the label ** Do not exceed 4000 mg of tylenol in 24 hours **Do not take medications that contain aspirin or ibuprofen

## 2023-11-15 NOTE — MAU Provider Note (Cosign Needed Addendum)
VB, LBP, abdominal cramping    S Cindy Bruce is a 32 y.o. [phone] pregnant female at Unknown who presents to MAU today with complaint of VB, lower abdominal pain and LBP. Denies recent coitus.     Pertinent items noted in HPI and remainder of comprehensive ROS otherwise negative.   O BP 107/61 (BP Location: Right Arm)   Pulse (!) 103   Temp 98 F (36.7 C) (Oral)   Resp 18   Ht 5\' 1"  (1.549 m)   Wt 64 kg   LMP 09/11/2023 (Approximate)   SpO2 100%   BMI 26.64 kg/m  Physical Exam Vitals and nursing note reviewed.  Constitutional:      General: She is not in acute distress.    Appearance: Normal appearance. She is not ill-appearing.  HENT:     Head: Normocephalic and atraumatic.     Right Ear: External ear normal.     Left Ear: External ear normal.     Nose: Nose normal.     Mouth/Throat:     Mouth: Mucous membranes are dry.     Pharynx: Oropharynx is clear.  Eyes:     Extraocular Movements: Extraocular movements intact.     Conjunctiva/sclera: Conjunctivae normal.  Cardiovascular:     Rate and Rhythm: Tachycardia present.  Pulmonary:     Effort: Pulmonary effort is normal. No respiratory distress.  Abdominal:     General: Abdomen is flat.     Palpations: Abdomen is soft.  Musculoskeletal:        General: Normal range of motion.     Cervical back: Normal range of motion.  Skin:    General: Skin is warm and dry.  Neurological:     Mental Status: She is alert and oriented to person, place, and time. Mental status is at baseline.  Psychiatric:        Mood and Affect: Mood normal.        Behavior: Behavior normal.      MDM: MAU Course: +UPT UA 50 glucose and 20 ketones, noninfectious  [place] work up as below:  CBC no leukocytosis, Hgb 8.8, Plts 354  bHCG pending  Wet prep with BV*  GC collected   OB US single IUP at [redacted]w[redacted]d (EDD 07/04/24) with cardiac activity  A&P: #[redacted] weeks gestation #Bacterial vaginitis #Anemia in pregnancy, first trimester  - sent  flagyl  - sent PNV - therapy plan for OP iron infusion - pt states she's a known diabetic on metformin and will follow up with PCP to ensure tight control on glucose while awaiting [facility] app[facility]  Discharge from MAU in stable condition with strict/usual precautions [facility] as scheduled for ongoing prenatal care  Allergies as of 11/15/2023       Reactions   Food Anaphylaxis, Other (See Comments)   Pt is allergic to cantaloupe.   Watermelon [citrullus Vulgaris] Anaphylaxis        Medication List     STOP taking these medications    CRANBERRY PO   doxycycline 100 MG capsule Commonly known as: VIBRAMYCIN   fluconazole 150 MG tablet Commonly known as: Diflucan   norgestimate-ethinyl estradiol 0.25-35 MG-MCG tablet Commonly known as: ORTHO-CYCLEN       TAKE these medications    ferrous sulfate 325 (65 FE) MG EC tablet Take 1 tablet (325 mg total) by mouth 2 (two) times daily.   metroNIDAZOLE 500 MG tablet Commonly known as: FLAGYL Take 1 tablet (500  mg total) by mouth 2 (two) times daily.   PrePLUS 27-1 MG Tabs Take 1 tablet by mouth daily.        Hessie Dibble, MD 11/15/2023 3:12 PM

## 2023-11-15 NOTE — Telephone Encounter (Signed)
TC from pt reporting VB and abdominal pain in early pregnancy. Had pregnancy confirmation [place] in November. Pt is [redacted]w[redacted]d today by that [place]. Pt advised to seek care in [facility]U. Directions for location provided.

## 2023-11-15 NOTE — Telephone Encounter (Signed)
Dr. Judd Lien, patient will be scheduled as soon as possible.  Auth Submission: NO AUTH NEEDED Site of care: Site of care: [facility] Payer: [employer] Medication & CPT/J Code(s) submitted: Venofer (Iron Sucrose) J1756 Route of submission (phone, fax, portal):  Phone # Fax # Auth type: Buy/Bill PB Units/visits requested: 200mg  5 doses Reference number:  Approval from: 11/15/23 to 12/07/23

## 2023-11-15 NOTE — MAU Note (Signed)
Cindy Bruce is a 32 y.o. at Unknown here in MAU reporting: she's having lower abdominal cramping, lower back pain, and VB.  Reports VB is noted with wiping.  States approx [redacted] wks pregnant.  Denies recent intercourse LMP: 09/11/2023 approx Onset of complaint: 2 days Pain score: 6 Vitals:   11/15/23 1215  BP: 107/61  Pulse: (!) 103  Resp: 18  Temp: 98 F (36.7 C)  SpO2: 100%     FHT:NA Lab orders placed from triage:   UPT

## 2023-11-15 NOTE — Telephone Encounter (Signed)
error 

## 2023-11-15 NOTE — Progress Notes (Signed)
Patient found to be anemia to Hgb 8.8 in first trimester pregnancy.

## 2023-11-16 LAB — GC/CHLAMYDIA PROBE AMP (~~LOC~~) NOT AT ARMC
Chlamydia: NEGATIVE
Comment: NEGATIVE
Comment: NORMAL
Neisseria Gonorrhea: NEGATIVE

## 2023-11-22 ENCOUNTER — Other Ambulatory Visit (INDEPENDENT_AMBULATORY_CARE_PROVIDER_SITE_OTHER): Payer: Medicaid Other

## 2023-11-22 ENCOUNTER — Ambulatory Visit: Payer: Medicaid Other

## 2023-11-22 ENCOUNTER — Ambulatory Visit (INDEPENDENT_AMBULATORY_CARE_PROVIDER_SITE_OTHER): Payer: Medicaid Other | Admitting: *Deleted

## 2023-11-22 VITALS — BP 112/68 | HR 105 | Wt 141.7 lb

## 2023-11-22 DIAGNOSIS — Z3A01 Less than 8 weeks gestation of pregnancy: Secondary | ICD-10-CM | POA: Diagnosis not present

## 2023-11-22 DIAGNOSIS — O3680X0 Pregnancy with inconclusive fetal viability, not applicable or unspecified: Secondary | ICD-10-CM

## 2023-11-22 DIAGNOSIS — O0990 Supervision of high risk pregnancy, unspecified, unspecified trimester: Secondary | ICD-10-CM

## 2023-11-22 DIAGNOSIS — O0991 Supervision of high risk pregnancy, unspecified, first trimester: Secondary | ICD-10-CM | POA: Diagnosis not present

## 2023-11-22 DIAGNOSIS — Z1339 Encounter for screening examination for other mental health and behavioral disorders: Secondary | ICD-10-CM

## 2023-11-22 DIAGNOSIS — O99011 Anemia complicating pregnancy, first trimester: Secondary | ICD-10-CM

## 2023-11-22 MED ORDER — BLOOD PRESSURE KIT DEVI: 1.0000 | 0 refills | Status: AC

## 2023-11-22 NOTE — Patient Instructions (Signed)

## 2023-11-22 NOTE — Progress Notes (Signed)
New OB Intake  I connected with Cindy Bruce  on 11/22/23 at  9:30 AM EST  In Person Visit and verified that I am speaking with the correct person using two identifiers. Nurse is located at [facility] and pt is located at [place].  I discussed the limitations, risks, security and privacy concerns of performing an evaluation and management service by telephone and the availability of in person appointments. I also discussed with the patient that there may be a patient responsible charge related to this service. The patient expressed understanding and agreed to proceed.  I explained I am completing New OB Intake today. We discussed EDD of 07/04/2024, by Ultrasound. Pt is [id number]. I reviewed her allergies, medications and Medical/Surgical/OB history.    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 Discussed the nature of our practice with multiple providers including residents and students. Due to the size of the practice, the delivering provider may not be the same as those providing prenatal care.   Patient is not interested in water birth. Offered upcoming OB visit with CNM to discuss further.  MyChart/Babyscripts MyChart access verified. I explained pt will have some visits in office and some virtually. Babyscripts instructions given and order placed. Patient verifies receipt of registration  text/e-mail. Account successfully created and app downloaded. If patient is a candidate for Optimized scheduling, add to sticky note.   Blood Pressure Cuff/Weight Scale Blood pressure cuff ordered for patient to pick-up from [employer]. Explained after first prenatal appt pt will check weekly and document in Babyscripts. Patient does not have weight scale; patient may purchase if they desire to track weight weekly in Babyscripts.  Anatomy US Explained first scheduled [place] will be around 19 weeks. Anatomy US scheduled for TBD at TBD.  Interested in [facility]? If yes, send referral and doula dot phrase.   Is patient a candidate for Babyscripts Optimization? No, due to High Risk   First visit review I reviewed new OB appt with patient. Explained pt will be seen by Dr. Berton Lan at first visit. Discussed [name] genetic screening with patient. Requests Panorama and Horizon.. Routine prenatal labs  collected at today's visit.    Last Pap Diagnosis  Date Value Ref Range Status  10/29/2020      - Negative for intraepithelial lesion or malignancy (NILM)    Cindy Lemon, RN 11/22/2023  10:24 AM

## 2023-11-23 LAB — CBC/D/PLT+RPR+RH+ABO+RUBIGG...
Antibody Screen: NEGATIVE
Basophils Absolute: 0 10*3/uL (ref 0.0–0.2)
Basos: 1 %
EOS (ABSOLUTE): 0 10*3/uL (ref 0.0–0.4)
Eos: 1 %
HCV Ab: NONREACTIVE
HIV Screen 4th Generation wRfx: NONREACTIVE
Hematocrit: 28 % — ABNORMAL LOW (ref 34.0–46.6)
Hemoglobin: 8.3 g/dL — ABNORMAL LOW (ref 11.1–15.9)
Hepatitis B Surface Ag: NEGATIVE
Immature Grans (Abs): 0 10*3/uL (ref 0.0–0.1)
Immature Granulocytes: 0 %
Lymphocytes Absolute: 2 10*3/uL (ref 0.7–3.1)
Lymphs: 44 %
MCH: 18.3 pg — ABNORMAL LOW (ref 26.6–33.0)
MCHC: 29.6 g/dL — ABNORMAL LOW (ref 31.5–35.7)
MCV: 62 fL — ABNORMAL LOW (ref 79–97)
Monocytes Absolute: 0.5 10*3/uL (ref 0.1–0.9)
Monocytes: 11 %
Neutrophils Absolute: 1.9 10*3/uL (ref 1.4–7.0)
Neutrophils: 43 %
Platelets: 359 10*3/uL (ref 150–450)
RBC: 4.53 x10E6/uL (ref 3.77–5.28)
RDW: 21.2 % — ABNORMAL HIGH (ref 11.7–15.4)
RPR Ser Ql: NONREACTIVE
Rh Factor: NEGATIVE
Rubella Antibodies, IGG: 1.29 {index} (ref >0.99)
WBC: 4.4 10*3/uL (ref 3.4–10.8)

## 2023-11-23 LAB — HCV INTERPRETATION

## 2023-11-23 LAB — HEMOGLOBIN A1C
Est. average glucose Bld gHb Est-mCnc: 232 mg/dL
Hgb A1c MFr Bld: 9.7 % — ABNORMAL HIGH (ref 4.8–5.6)

## 2023-11-24 ENCOUNTER — Encounter: Payer: Self-pay | Admitting: Obstetrics and Gynecology

## 2023-11-24 ENCOUNTER — Ambulatory Visit: Payer: Medicaid Other

## 2023-11-24 VITALS — BP 102/65 | HR 96 | Temp 98.6°F | Resp 18 | Ht 61.0 in | Wt 143.2 lb

## 2023-11-24 DIAGNOSIS — Z3A08 8 weeks gestation of pregnancy: Secondary | ICD-10-CM | POA: Diagnosis not present

## 2023-11-24 DIAGNOSIS — O99011 Anemia complicating pregnancy, first trimester: Secondary | ICD-10-CM

## 2023-11-24 DIAGNOSIS — Z8759 Personal history of other complications of pregnancy, childbirth and the puerperium: Secondary | ICD-10-CM | POA: Insufficient documentation

## 2023-11-24 DIAGNOSIS — D508 Other iron deficiency anemias: Secondary | ICD-10-CM | POA: Diagnosis not present

## 2023-11-24 MED ORDER — IRON SUCROSE 20 MG/ML IV SOLN
200.0000 mg | Freq: Once | INTRAVENOUS | Status: AC
  Administered 2023-11-24: 200 mg via INTRAVENOUS
  Filled 2023-11-24: qty 10

## 2023-11-24 MED ORDER — ACETAMINOPHEN 325 MG PO TABS
650.0000 mg | ORAL_TABLET | Freq: Once | ORAL | Status: AC
  Administered 2023-11-24: 650 mg via ORAL
  Filled 2023-11-24: qty 2

## 2023-11-24 MED ORDER — DIPHENHYDRAMINE HCL 25 MG PO CAPS
25.0000 mg | ORAL_CAPSULE | Freq: Once | ORAL | Status: AC
  Administered 2023-11-24: 25 mg via ORAL
  Filled 2023-11-24: qty 1

## 2023-11-24 NOTE — Progress Notes (Signed)
 Diagnosis: Iron Deficiency Anemia  Provider:  Chilton Greathouse MD  Procedure: IV Push  IV Type: Peripheral, IV Location: L Antecubital  Venofer (Iron Sucrose), Dose: 200 mg  Post Infusion IV Care: Observation period completed and Peripheral IV Discontinued  Discharge: Condition: Good, Destination: Home . AVS Provided  Performed by:  Garnette Czech, RN

## 2023-11-26 ENCOUNTER — Other Ambulatory Visit: Payer: Self-pay

## 2023-11-26 ENCOUNTER — Emergency Department (HOSPITAL_BASED_OUTPATIENT_CLINIC_OR_DEPARTMENT_OTHER)
Admission: EM | Admit: 2023-11-26 | Discharge: 2023-11-26 | Disposition: A | Discharge status: Home / Self Care | Payer: Medicaid Other | Attending: Emergency Medicine | Admitting: Emergency Medicine

## 2023-11-26 ENCOUNTER — Encounter (HOSPITAL_BASED_OUTPATIENT_CLINIC_OR_DEPARTMENT_OTHER): Payer: Self-pay | Admitting: Emergency Medicine

## 2023-11-26 ENCOUNTER — Ambulatory Visit: Payer: Medicaid Other

## 2023-11-26 VITALS — BP 129/73 | HR 98 | Temp 98.3°F | Resp 18 | Ht 61.0 in | Wt 144.2 lb

## 2023-11-26 DIAGNOSIS — Z3A08 8 weeks gestation of pregnancy: Secondary | ICD-10-CM | POA: Diagnosis not present

## 2023-11-26 DIAGNOSIS — O26891 Other specified pregnancy related conditions, first trimester: Secondary | ICD-10-CM | POA: Insufficient documentation

## 2023-11-26 DIAGNOSIS — O99011 Anemia complicating pregnancy, first trimester: Secondary | ICD-10-CM | POA: Diagnosis not present

## 2023-11-26 DIAGNOSIS — D508 Other iron deficiency anemias: Secondary | ICD-10-CM

## 2023-11-26 DIAGNOSIS — E119 Type 2 diabetes mellitus without complications: Secondary | ICD-10-CM | POA: Diagnosis not present

## 2023-11-26 DIAGNOSIS — Z3491 Encounter for supervision of normal pregnancy, unspecified, first trimester: Secondary | ICD-10-CM

## 2023-11-26 DIAGNOSIS — O24311 Unspecified pre-existing diabetes mellitus in pregnancy, first trimester: Secondary | ICD-10-CM | POA: Diagnosis not present

## 2023-11-26 DIAGNOSIS — Z7984 Long term (current) use of oral hypoglycemic drugs: Secondary | ICD-10-CM | POA: Diagnosis not present

## 2023-11-26 DIAGNOSIS — R072 Precordial pain: Secondary | ICD-10-CM | POA: Diagnosis not present

## 2023-11-26 DIAGNOSIS — Z3A00 Weeks of gestation of pregnancy not specified: Secondary | ICD-10-CM | POA: Insufficient documentation

## 2023-11-26 LAB — BASIC METABOLIC PANEL
Anion gap: 9 (ref 5–15)
BUN: <5 mg/dL — ABNORMAL LOW (ref 6–20)
CO2: 20 mmol/L — ABNORMAL LOW (ref 22–32)
Calcium: 9.1 mg/dL (ref 8.9–10.3)
Chloride: 104 mmol/L (ref 98–111)
Creatinine, Ser: 0.4 mg/dL — ABNORMAL LOW (ref 0.44–1.00)
GFR, Estimated: >60 mL/min (ref >60)
Glucose, Bld: 220 mg/dL — ABNORMAL HIGH (ref 70–99)
Potassium: 3.4 mmol/L — ABNORMAL LOW (ref 3.5–5.1)
Sodium: 133 mmol/L — ABNORMAL LOW (ref 135–145)

## 2023-11-26 LAB — TROPONIN I (HIGH SENSITIVITY)
Troponin I (High Sensitivity): <2 ng/L (ref <18)
Troponin I (High Sensitivity): <2 ng/L (ref <18)

## 2023-11-26 LAB — CBC
HCT: 25.8 % — ABNORMAL LOW (ref 36.0–46.0)
Hemoglobin: 8 g/dL — ABNORMAL LOW (ref 12.0–15.0)
MCH: 18.8 pg — ABNORMAL LOW (ref 26.0–34.0)
MCHC: 31 g/dL (ref 30.0–36.0)
MCV: 60.7 fL — ABNORMAL LOW (ref 80.0–100.0)
Platelets: 413 10*3/uL — ABNORMAL HIGH (ref 150–400)
RBC: 4.25 MIL/uL (ref 3.87–5.11)
RDW: 23.3 % — ABNORMAL HIGH (ref 11.5–15.5)
WBC: 5.5 10*3/uL (ref 4.0–10.5)
nRBC: 0 % (ref 0.0–0.2)

## 2023-11-26 LAB — URINE CULTURE, OB REFLEX

## 2023-11-26 LAB — CULTURE, OB URINE

## 2023-11-26 MED ORDER — ACETAMINOPHEN 325 MG PO TABS
650.0000 mg | ORAL_TABLET | Freq: Once | ORAL | Status: AC
  Administered 2023-11-26: 650 mg via ORAL
  Filled 2023-11-26: qty 2

## 2023-11-26 MED ORDER — DIPHENHYDRAMINE HCL 25 MG PO CAPS
25.0000 mg | ORAL_CAPSULE | Freq: Once | ORAL | Status: AC
  Administered 2023-11-26: 25 mg via ORAL
  Filled 2023-11-26: qty 1

## 2023-11-26 MED ORDER — IRON SUCROSE 20 MG/ML IV SOLN
200.0000 mg | Freq: Once | INTRAVENOUS | Status: AC
  Administered 2023-11-26: 200 mg via INTRAVENOUS
  Filled 2023-11-26: qty 10

## 2023-11-26 NOTE — Progress Notes (Signed)
 Diagnosis: Iron Deficiency Anemia  Provider:  Chilton Greathouse MD  Procedure: IV Push  IV Type: Peripheral, IV Location: R Antecubital  Venofer (Iron Sucrose), Dose: 200 mg  Post Infusion IV Care: Patient declined observation and Peripheral IV Discontinued  Discharge: Condition: Good, Destination: Home . AVS Declined  Performed by:  Adriana Mccallum, RN

## 2023-11-26 NOTE — ED Triage Notes (Signed)
Pt via pov from iron infusion clinic with chest pain after the infusion. Pt endorses "a little bit" of sob and nausea. Denies emesis. Pt alert & oriented, nad noted.

## 2023-11-26 NOTE — ED Provider Notes (Signed)
[place] EMERGENCY DEPARTMENT AT [facility] Provider Note   CSN: [id number] Arrival date & time: 11/26/23  1350     History  Chief Complaint  Patient presents with   Chest Pain    Cindy Bruce is a 32 y.o. female.  Patient who is 8w 3d pregnant with history of anemia, diabetes on metformin presents to the emergency department for evaluation of chest pain.  Patient went and had her second of 5 IV iron infusions today.  She had no difficulties with the infusion.  She went home and states that while she was lying down, developed a dull chest pain/ache in the sternal area and slightly to the left.  Pain did not radiate.  She reports "a little" shortness of breath, but not different than her baseline with her current anemia..  No associated diaphoresis or vomiting.  No lightheadedness or syncope.  Patient did not have similar symptoms after her first infusion.  No history of heart disease, hypertension, high cholesterol, diabetes. Patient denies risk factors for pulmonary embolism including: unilateral leg swelling, history of DVT/PE/other blood clots, use of exogenous hormones, recent immobilizations, recent surgery, recent travel (>4hr segment), malignancy, hemoptysis.          Home Medications Prior to Admission medications   Medication Sig Start Date End Date Taking? Authorizing Provider  Blood Pressure Monitoring (BLOOD PRESSURE KIT) DEVI 1 Device by Does not apply route once a week. 11/22/23   Constant, Peggy, MD  ferrous sulfate 325 (65 FE) MG EC tablet Take 1 tablet (325 mg total) by mouth 2 (two) times daily. Patient not taking: Reported on 11/22/2023 11/15/23 11/14/24  Hessie Dibble, MD  metFORMIN (GLUCOPHAGE) 500 MG tablet Take 500 mg by mouth 2 (two) times daily with a meal. 11/12/17   [provider]  metroNIDAZOLE (FLAGYL) 500 MG tablet Take 1 tablet (500 mg total) by mouth 2 (two) times daily. 11/15/23   Hessie Dibble, MD  Prenatal Vit-Fe Fumarate-FA  (PREPLUS) 27-1 MG TABS Take 1 tablet by mouth daily. 11/15/23   Hessie Dibble, MD  ferrous gluconate (IRON 27) 240 (27 FE) MG tablet Take 1 tablet (240 mg total) by mouth 3 (three) times daily with meals. 01/26/17 11/11/19  Roe Coombs, CNM      Allergies    Food and Watermelon [citrullus vulgaris]    Review of Systems   Review of Systems  Physical Exam Updated Vital Signs BP 110/65 (BP Location: Left Arm)   Pulse (!) 101   Temp 98.2 F (36.8 C) (Oral)   Resp 18   Ht 5\' 1"  (1.549 m)   Wt 65.3 kg   LMP 09/11/2023 (Approximate)   SpO2 100%   BMI 27.21 kg/m  Physical Exam Vitals and nursing note reviewed.  Constitutional:      Appearance: She is well-developed. She is not diaphoretic.  HENT:     Head: Normocephalic and atraumatic.     Mouth/Throat:     Mouth: Mucous membranes are not dry.  Eyes:     Conjunctiva/sclera: Conjunctivae normal.  Neck:     Vascular: Normal carotid pulses. No JVD.     Trachea: Trachea normal. No tracheal deviation.  Cardiovascular:     Rate and Rhythm: Normal rate and regular rhythm.     Pulses: No decreased pulses.          Radial pulses are 2+ on the right side and 2+ on the left side.     Heart sounds: Normal  heart sounds, S1 normal and S2 normal. No murmur heard. Pulmonary:     Effort: Pulmonary effort is normal. No respiratory distress.     Breath sounds: No wheezing.  Chest:     Chest wall: No tenderness.  Abdominal:     General: Bowel sounds are normal.     Palpations: Abdomen is soft.     Tenderness: There is no abdominal tenderness. There is no guarding or rebound.  Musculoskeletal:        General: Normal range of motion.     Cervical back: Normal range of motion and neck supple. No muscular tenderness.  Skin:    General: Skin is warm and dry.     Coloration: Skin is not pale.  Neurological:     Mental Status: She is alert.     ED Results / Procedures / Treatments   Labs (all labs ordered are listed, but only  abnormal results are displayed) Labs Reviewed  BASIC METABOLIC PANEL - Abnormal; Notable for the following components:      Result Value   Sodium 133 (*)    Potassium 3.4 (*)    CO2 20 (*)    Glucose, Bld 220 (*)    BUN <5 (*)    Creatinine, Ser 0.40 (*)    All other components within normal limits  CBC - Abnormal; Notable for the following components:   Hemoglobin 8.0 (*)    HCT 25.8 (*)    MCV 60.7 (*)    MCH 18.8 (*)    RDW 23.3 (*)    Platelets 413 (*)    All other components within normal limits  TROPONIN I (HIGH SENSITIVITY)  TROPONIN I (HIGH SENSITIVITY)    EKG EKG Interpretation Date/Time:  Friday November 26 2023 13:59:11 EST Ventricular Rate:  103 PR Interval:  132 QRS Duration:  70 QT Interval:  358 QTC Calculation: 468 R Axis:   76  Text Interpretation: Sinus tachycardia Otherwise normal ECG When compared with ECG of 21-May-2017 22:39, PREVIOUS ECG IS PRESENT No significant change since last tracing Confirmed by Jacalyn Lefevre [phone]) on 11/26/2023 2:13:03 PM  Radiology No results found.  Procedures Procedures    Medications Ordered in ED Medications - No data to display  ED Course/ Medical Decision Making/ A&P    Patient seen and examined. History obtained directly from patient. Work-up including labs, imaging, EKG ordered in triage, if performed, were reviewed.    Labs/EKG: Independently reviewed and interpreted.  This included: CBC with normal white blood cell count, hemoglobin 8.0, stable, microcytic; BMP with sodium 133, potassium 3.4, glucose 220; troponin less than 2.  EKG with sinus tachycardia.  Imaging: Patient currently declining x-ray due to pregnancy.  Medications/Fluids: None ordered  Most recent vital signs reviewed and are as follows: BP 110/65 (BP Location: Left Arm)   Pulse (!) 101   Temp 98.2 F (36.8 C) (Oral)   Resp 18   Ht 5\' 1"  (1.549 m)   Wt 65.3 kg   LMP 09/11/2023 (Approximate)   SpO2 100%   BMI 27.21 kg/m    Initial impression: Awaiting second troponin  5:41 PM Reassessment performed. Patient appears stable, comfortable. Chest pain resolved.  Labs personally reviewed and interpreted including: Second troponin, also less than 2  Reviewed pertinent lab work and imaging with patient at bedside. Questions answered.   Most current vital signs reviewed and are as follows: BP 110/65 (BP Location: Left Arm)   Pulse (!) 101   Temp 98.2 F (36.8  C) (Oral)   Resp 18   Ht 5\' 1"  (1.549 m)   Wt 65.3 kg   LMP 09/11/2023 (Approximate)   SpO2 100%   BMI 27.21 kg/m   Plan: Discharge to home.   Prescriptions written for: None  Other home care instructions discussed: Tylenol for pain, avoidance of triggers, monitor closely for symptoms after next infusion.  ED return instructions discussed: Return and follow-up instructions: I encouraged patient to return to ED with severe chest pain, especially if the pain is crushing or pressure-like and spreads to the arms, back, neck, or jaw, or if they have associated sweating, vomiting, or shortness of breath with the pain, or significant pain with activity. We discussed that the evaluation here today indicates a low-risk of serious cause of chest pain, including heart trouble or a blood clot, but no evaluation is perfect and chest pain can evolve with time. The patient verbalized understanding and agreed.  I encouraged patient to follow-up with their provider for recheck.                                    Medical Decision Making Amount and/or Complexity of Data Reviewed Labs: ordered.   For this patient's complaint of chest pain, the following emergent conditions were considered on the differential diagnosis: acute coronary syndrome, pulmonary embolism, pneumothorax, myocarditis, pericardial tamponade, aortic dissection, thoracic aortic aneurysm complication, esophageal perforation.   Other causes were also considered including: gastroesophageal reflux  disease, musculoskeletal pain including costochondritis, pneumonia/pleurisy, herpes zoster, pericarditis.  Patient's pain started shortly after receiving IV iron infusion today.  She did not have other symptoms of anaphylaxis or allergic reaction.  She went to an urgent care and was referred to the emergency department.  In regards to possibility of ACS, patient has atypical features of pain, non-ischemic and unchanged EKG and negative troponin(s). Heart score was calculated to be 0.   In regards to possibility of PE, symptoms are atypical for PE and risk profile is low, making PE low likelihood.  Mild tachycardia likely related to pregnancy.  No clinical signs of DVT.  No hypoxia, oxygen 100%.  EKG without signs of strain.  The patient's vital signs, pertinent lab work and imaging were reviewed and interpreted as discussed in the ED course. Hospitalization was considered for further testing, treatments, or serial exams/observation. However as patient is well-appearing, has a stable exam, and reassuring studies today, I do not feel that they warrant admission at this time. This plan was discussed with the patient who verbalizes agreement and comfort with this plan and seems reliable and able to return to the Emergency Department with worsening or changing symptoms.          Final Clinical Impression(s) / ED Diagnoses Final diagnoses:  Precordial pain  First trimester pregnancy    Rx / DC Orders ED Discharge Orders     None         Renne Crigler, PA-C 11/26/23 1743    Rolan Bucco, MD 11/26/23 2342

## 2023-11-26 NOTE — Discharge Instructions (Addendum)
Please read and follow all provided instructions.  Your diagnoses today include:  1. Precordial pain   2. First trimester pregnancy     Tests performed today include: An EKG of your heart Cardiac enzymes - a blood test for heart muscle damage Blood counts and electrolytes Vital signs. See below for your results today.   Medications prescribed:  None  Take any prescribed medications only as directed.  Follow-up instructions: Please follow-up with your primary care provider as soon as you can for further evaluation of your symptoms.   Return instructions:  SEEK IMMEDIATE MEDICAL ATTENTION IF: You have severe chest pain, especially if the pain is crushing or pressure-like and spreads to the arms, back, neck, or jaw, or if you have sweating, nausea or vomiting, or trouble with breathing. THIS IS AN EMERGENCY. Do not wait to see if the pain will go away. Get medical help at once. Call 911. DO NOT drive yourself to the hospital.  Your chest pain gets worse and does not go away after a few minutes of rest.  You have an attack of chest pain lasting longer than what you usually experience.  You have significant dizziness, if you pass out, or have trouble walking.  You have chest pain not typical of your usual pain for which you originally saw your caregiver.  You have any other emergent concerns regarding your health.  Additional Information: Chest pain comes from many different causes. Your caregiver has diagnosed you as having chest pain that is not specific for one problem, but does not require admission.  You are at low risk for an acute heart condition or other serious illness.   Your vital signs today were: BP 110/65 (BP Location: Left Arm)   Pulse (!) 101   Temp 98.2 F (36.8 C) (Oral)   Resp 18   Ht 5\' 1"  (1.549 m)   Wt 65.3 kg   LMP 09/11/2023 (Approximate)   SpO2 100%   BMI 27.21 kg/m  If your blood pressure (BP) was elevated above 135/85 this visit, please have this  repeated by your doctor within one month. --------------

## 2023-11-29 MED ORDER — CEPHALEXIN 500 MG PO CAPS: 500.0000 mg | ORAL_CAPSULE | Freq: Four times a day (QID) | ORAL | 2 refills | Status: AC

## 2023-11-29 NOTE — Addendum Note (Signed)
Addended by: Catalina Antigua on: 11/29/2023 11:56 AM   Modules accepted: Orders

## 2023-12-03 ENCOUNTER — Ambulatory Visit (INDEPENDENT_AMBULATORY_CARE_PROVIDER_SITE_OTHER): Payer: Medicaid Other

## 2023-12-03 VITALS — BP 108/71 | HR 104 | Temp 103.0°F | Resp 16 | Ht 61.0 in | Wt 143.0 lb

## 2023-12-03 DIAGNOSIS — O99011 Anemia complicating pregnancy, first trimester: Secondary | ICD-10-CM

## 2023-12-03 DIAGNOSIS — Z3A09 9 weeks gestation of pregnancy: Secondary | ICD-10-CM

## 2023-12-03 DIAGNOSIS — D508 Other iron deficiency anemias: Secondary | ICD-10-CM

## 2023-12-03 MED ORDER — IRON SUCROSE 20 MG/ML IV SOLN
200.0000 mg | Freq: Once | INTRAVENOUS | Status: AC
  Administered 2023-12-03: 200 mg via INTRAVENOUS

## 2023-12-03 MED ORDER — DIPHENHYDRAMINE HCL 25 MG PO CAPS
25.0000 mg | ORAL_CAPSULE | Freq: Once | ORAL | Status: AC
  Administered 2023-12-03: 25 mg via ORAL

## 2023-12-03 MED ORDER — ACETAMINOPHEN 325 MG PO TABS
650.0000 mg | ORAL_TABLET | Freq: Once | ORAL | Status: AC
  Administered 2023-12-03: 650 mg via ORAL

## 2023-12-03 NOTE — Progress Notes (Cosign Needed Addendum)
 Diagnosis: Iron Deficiency Anemia  Provider:  Chilton Greathouse MD  Procedure: IV Push  IV Type: Peripheral, IV Location: R Antecubital  Venofer (Iron Sucrose), Dose: 200 mg  Post Infusion IV Care: Observation period completed and Peripheral IV Discontinued  Discharge: Condition: Good, Destination: Home . AVS Provided  Performed by:  Rico Ala, LPN

## 2023-12-06 ENCOUNTER — Ambulatory Visit (INDEPENDENT_AMBULATORY_CARE_PROVIDER_SITE_OTHER): Payer: Medicaid Other

## 2023-12-06 VITALS — BP 98/58 | HR 100 | Temp 98.2°F | Resp 20 | Ht 61.0 in | Wt 143.6 lb

## 2023-12-06 DIAGNOSIS — Z3A09 9 weeks gestation of pregnancy: Secondary | ICD-10-CM

## 2023-12-06 DIAGNOSIS — O99011 Anemia complicating pregnancy, first trimester: Secondary | ICD-10-CM

## 2023-12-06 DIAGNOSIS — D508 Other iron deficiency anemias: Secondary | ICD-10-CM

## 2023-12-06 MED ORDER — ACETAMINOPHEN 325 MG PO TABS
650.0000 mg | ORAL_TABLET | Freq: Once | ORAL | Status: AC
  Administered 2023-12-06: 650 mg via ORAL
  Filled 2023-12-06: qty 2

## 2023-12-06 MED ORDER — DIPHENHYDRAMINE HCL 25 MG PO CAPS
25.0000 mg | ORAL_CAPSULE | Freq: Once | ORAL | Status: AC
  Administered 2023-12-06: 25 mg via ORAL
  Filled 2023-12-06: qty 1

## 2023-12-06 MED ORDER — IRON SUCROSE 20 MG/ML IV SOLN
200.0000 mg | Freq: Once | INTRAVENOUS | Status: AC
  Administered 2023-12-06: 200 mg via INTRAVENOUS
  Filled 2023-12-06: qty 10

## 2023-12-06 NOTE — Progress Notes (Signed)
Diagnosis: Acute Anemia  Provider:  Chilton Greathouse MD  Procedure: IV Push  IV Type: Peripheral, IV Location: R Forearm  Venofer (Iron Sucrose), Dose: 200 mg  Post Infusion IV Care: Observation period completed and Peripheral IV Discontinued  Discharge: Condition: Good, Destination: Home . AVS Declined  Performed by:  Rico Ala, LPN

## 2023-12-09 MED ORDER — IRON SUCROSE 20 MG/ML IV SOLN
200.0000 mg | Freq: Once | INTRAVENOUS | Status: DC
Start: 2023-12-09 — End: 2023-12-10

## 2023-12-09 MED ORDER — DIPHENHYDRAMINE HCL 25 MG PO CAPS: 25.0000 mg | ORAL_CAPSULE | Freq: Once | ORAL | Status: DC

## 2023-12-09 MED ORDER — ACETAMINOPHEN 325 MG PO TABS
650.0000 mg | ORAL_TABLET | Freq: Once | ORAL | Status: DC
Start: 2023-12-09 — End: 2023-12-10

## 2023-12-10 ENCOUNTER — Ambulatory Visit: Payer: Medicaid Other

## 2023-12-10 ENCOUNTER — Other Ambulatory Visit (HOSPITAL_COMMUNITY)
Admission: RE | Admit: 2023-12-10 | Discharge: 2023-12-10 | Disposition: A | Discharge status: Home / Self Care | Payer: Medicaid Other | Source: Ambulatory Visit | Attending: Obstetrics and Gynecology | Admitting: Obstetrics and Gynecology

## 2023-12-10 ENCOUNTER — Encounter: Payer: Self-pay | Admitting: Obstetrics and Gynecology

## 2023-12-10 ENCOUNTER — Ambulatory Visit (INDEPENDENT_AMBULATORY_CARE_PROVIDER_SITE_OTHER): Payer: Medicaid Other | Admitting: Obstetrics and Gynecology

## 2023-12-10 VITALS — BP 110/70 | HR 98 | Wt 143.0 lb

## 2023-12-10 DIAGNOSIS — O0990 Supervision of high risk pregnancy, unspecified, unspecified trimester: Secondary | ICD-10-CM | POA: Insufficient documentation

## 2023-12-10 DIAGNOSIS — O99011 Anemia complicating pregnancy, first trimester: Secondary | ICD-10-CM

## 2023-12-10 DIAGNOSIS — O0991 Supervision of high risk pregnancy, unspecified, first trimester: Secondary | ICD-10-CM

## 2023-12-10 DIAGNOSIS — Z8759 Personal history of other complications of pregnancy, childbirth and the puerperium: Secondary | ICD-10-CM | POA: Diagnosis not present

## 2023-12-10 DIAGNOSIS — O285 Abnormal chromosomal and genetic finding on antenatal screening of mother: Secondary | ICD-10-CM

## 2023-12-10 DIAGNOSIS — Z23 Encounter for immunization: Secondary | ICD-10-CM | POA: Diagnosis not present

## 2023-12-10 DIAGNOSIS — Z6791 Unspecified blood type, Rh negative: Secondary | ICD-10-CM

## 2023-12-10 DIAGNOSIS — O26899 Other specified pregnancy related conditions, unspecified trimester: Secondary | ICD-10-CM

## 2023-12-10 DIAGNOSIS — Z3A10 10 weeks gestation of pregnancy: Secondary | ICD-10-CM

## 2023-12-10 DIAGNOSIS — O24111 Pre-existing diabetes mellitus, type 2, in pregnancy, first trimester: Secondary | ICD-10-CM

## 2023-12-10 DIAGNOSIS — Z8751 Personal history of pre-term labor: Secondary | ICD-10-CM | POA: Insufficient documentation

## 2023-12-10 DIAGNOSIS — O35BXX0 Maternal care for other (suspected) fetal abnormality and damage, fetal cardiac anomalies, not applicable or unspecified: Secondary | ICD-10-CM

## 2023-12-10 DIAGNOSIS — L309 Dermatitis, unspecified: Secondary | ICD-10-CM | POA: Insufficient documentation

## 2023-12-10 MED ORDER — DEXCOM G6 TRANSMITTER MISC: 1.0000 [IU] | Freq: Every day | 11 refills | Status: AC

## 2023-12-10 MED ORDER — DEXCOM G6 RECEIVER DEVI: 1.0000 [IU] | Freq: Every day | 11 refills | Status: AC

## 2023-12-10 MED ORDER — IRON SUCROSE 20 MG/ML IV SOLN: 200.0000 mg | Freq: Once | INTRAVENOUS | Status: DC

## 2023-12-10 MED ORDER — INSULIN GLARGINE 100 UNIT/ML SOLOSTAR PEN: 6.0000 [IU] | PEN_INJECTOR | Freq: Two times a day (BID) | SUBCUTANEOUS | 11 refills | Status: AC

## 2023-12-10 MED ORDER — DEXCOM G6 SENSOR MISC: 1.0000 [IU] | Freq: Every day | 11 refills | Status: AC

## 2023-12-10 MED ORDER — PEN NEEDLES 33G X 4 MM MISC: 11 refills | Status: AC

## 2023-12-10 MED ORDER — TRIAMCINOLONE ACETONIDE 0.5 % EX OINT: TOPICAL_OINTMENT | CUTANEOUS | 0 refills | Status: DC

## 2023-12-10 MED ORDER — ACETAMINOPHEN 325 MG PO TABS: 650.0000 mg | ORAL_TABLET | Freq: Once | ORAL | Status: DC

## 2023-12-10 MED ORDER — DIPHENHYDRAMINE HCL 25 MG PO CAPS: 25.0000 mg | ORAL_CAPSULE | Freq: Once | ORAL | Status: DC

## 2023-12-10 MED ORDER — BLOOD GLUCOSE MONITORING SUPPL DEVI: 1.0000 | Freq: Four times a day (QID) | 0 refills | Status: AC

## 2023-12-10 MED ORDER — BLOOD GLUCOSE TEST VI STRP: 1.0000 | ORAL_STRIP | Freq: Four times a day (QID) | 11 refills | Status: AC

## 2023-12-10 MED ORDER — LANCETS MISC: 1.0000 | Freq: Four times a day (QID) | 11 refills | Status: AC

## 2023-12-10 NOTE — Progress Notes (Signed)
 NOB, c/o Metformin giving her diarrhea.

## 2023-12-10 NOTE — Progress Notes (Signed)
 Subjective:   Cindy Bruce is a 33 y.o. [id number] at [redacted]w[redacted]d by 6wk US  being seen today for her first obstetrical visit.  Her obstetrical history is significant for  T2DM with suboptimal control (A1c 9.7), hx shoulder dystocia, hx PPROM/PTB at 32wk, anemia, Rh negative status, and history of abnormal fetal echos (G3, G5) . Patient does not intend to breast feed. Pregnancy history fully reviewed.  G1 tSVD  G2 tSVD - GDM G3 tSVD - T2DM and fetal echo w/ RV dysfunction. No residual cardiac issues for child G4 tSVD - T2DM, c/b 2 min shoulder dystocia alleviated with McRoberts, suprapubic, and shoulder shrug. No short term or long term injury for child. Largest child at 7lb4.1oz (see remainder of birth weights below) G5 PPROM at 30wk with SVD at 32wk - T2DM fetal echo with pericardial effusion. No residual cardiac/health issues for child G6 current  Patient reports  vaginal irritation x 1 week and nausea/diarrhea from metformin . Recently changed her underwear prior to vaginal irritation starting  HISTORY: OB History  Gravida Para Term Preterm AB Living  6 5 4 1  0 5  SAB IAB Ectopic Multiple Live Births  0 0 0 0 5    # Outcome Date GA Lbr Len/2nd Weight Sex Type Anes PTL Lv  6 Current           5 Preterm 11/27/17 [redacted]w[redacted]d  4 lb 11.8 oz (2.149 kg) F Vag-Spont   LIV  4 Term 01/24/17 [redacted]w[redacted]d 16:31 / 00:08 7 lb 4.1 oz (3.29 kg) F Vag-Spont EPI  LIV     Name: LUDMILA, EBARB     Apgar1: 1  Apgar5: 9  3 Term 02/26/14 [redacted]w[redacted]d 08:51 / 00:40 6 lb 12.2 oz (3.067 kg) M Vag-Spont EPI  LIV     Name: Janelle,BOY Minervia     Apgar1: 8  Apgar5: 9  2 Term 01/06/13 [redacted]w[redacted]d 12:38 / 00:21 5 lb 7.6 oz (2.483 kg) M Vag-Spont EPI  LIV     Birth Comments: Mother denies complication during delivery     Name: Blanchette,BOY Nekisha     Apgar1: 9  Apgar5: 9  1 Term 10/16/07 [redacted]w[redacted]d  6 lb 7 oz (2.92 kg) F Vag-Spont EPI N LIV     Name: Zamiyah Smith     Last pap smear: Lab Results  Component Value Date   DIAGPAP  10/29/2020     - Negative for intraepithelial lesion or malignancy (NILM)   Past Medical History:  Diagnosis Date   Anemia    Asthma    Diabetes mellitus    Type 2 since 1st pg in 2008 - on insulin    Past Surgical History:  Procedure Laterality Date   TOOTH EXTRACTION     Family History  Problem Relation Age of Onset   Hypertension Mother    Diabetes Mother    Diabetes Maternal Grandmother    Social History   Tobacco Use   Smoking status: Former    Current packs/day: 1.00    Types: Cigarettes   Smokeless tobacco: Former  Building Services Engineer status: Never Used  Substance Use Topics   Alcohol use: Not Currently    Comment: occ   Drug use: No   Allergies  Allergen Reactions   Food Anaphylaxis and Other (See Comments)    Pt is allergic to cantaloupe.   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 1 each 0   cephALEXin  (KEFLEX ) 500 MG capsule Take 1 capsule (500 mg total) by mouth 4 (four) times daily. 28 capsule 2   ferrous sulfate  325 (65 FE) MG EC tablet Take 1 tablet (325 mg total) by mouth 2 (two) times daily. (Patient not taking: Reported on 11/22/2023) 60 tablet 3   metFORMIN  (GLUCOPHAGE ) 500 MG tablet Take 500 mg by mouth 2 (two) times daily with a meal.     metroNIDAZOLE  (FLAGYL ) 500 MG tablet Take 1 tablet (500 mg total) by mouth 2 (two) times daily. 14 tablet 0   Prenatal Vit-Fe Fumarate-FA (PREPLUS) 27-1 MG TABS Take 1 tablet by mouth daily. 30 tablet 13   [DISCONTINUED] ferrous gluconate  (IRON  27) 240 (27 FE) MG tablet Take 1 tablet (240 mg total) by mouth 3 (three) times daily with meals. 120 tablet 2   Current Facility-Administered Medications on File Prior to Visit  Medication Dose Route Frequency Provider Last Rate Last Admin   acetaminophen  (TYLENOL ) tablet 650 mg  650 mg Oral Once  Jhonny Augustin BROCKS, MD       diphenhydrAMINE  (BENADRYL ) capsule 25 mg  25 mg Oral Once Jhonny Augustin BROCKS, MD       iron  sucrose (VENOFER ) injection 200 mg  200 mg Intravenous Once Jhonny Augustin BROCKS, MD       Exam   Vitals:   12/10/23 0957  BP: 110/70  Pulse: 98  Weight: 143 lb (64.9 kg)   Fetal Heart Rate (bpm): 147  General:  Alert, oriented and cooperative. Patient is in no acute distress.  Breast: Deferred  Cardiovascular: Normal heart rate noted  Respiratory: Normal respiratory effort, no problems with respiration noted  Abdomen: Soft, non tender Pain/Pressure: Absent     Pelvic: Erythematous, thickened skin around vulva c/f contact/irritant dermatitis. Cervix visually normal and closed. Pap collected. Exam performed in presence of chaperone.         Extremities: Normal range of motion.  Edema: None  Mental Status: Normal mood and affect. Normal behavior. Normal judgment and thought content.    Assessment:   Pregnancy: H3E5894 Patient Active Problem List   Diagnosis Date Noted   History of preterm delivery 12/10/2023   History of shoulder dystocia in prior pregnancy 11/24/2023   Supervision of high risk pregnancy, antepartum 11/22/2023   Anemia of pregnancy in first trimester 11/15/2023   DM (diabetes mellitus) in pregnancy 01/24/2017   History of fetal cardiac disease complicating pregnancy 12/21/2013   Rh negative state in antepartum period 08/24/2013     Plan:  1. Supervision of high risk pregnancy, antepartum (Primary) 2. [redacted] weeks gestation of pregnancy Initial labs reviewed. Continue prenatal vitamins. Flu shot today Genetic Screening discussed: NIPS and CF/SMA carrier screen ordered. Normal hemoglobinopathy screening in prior pregnancy. Ultrasound discussed; fetal anatomic survey: scheduled 3/4 Problem list reviewed and updated. The nature of [facility] with multiple MDs and other Advanced Practice Providers was explained to patient;  also emphasized that residents, students are part of our team. Routine obstetric precautions reviewed. -     Cytology - PAP( [place]) -     PANORAMA PRENATAL TEST -     HORIZON 2 (CF & SMA) -     Flu vaccine trivalent PF, 6mos and older(Flulaval,Afluria,Fluarix,Fluzone)  3. Pre-existing type 2 diabetes mellitus during pregnancy in first trimester - Hgb A1c 9.7 on 11/22/23 - will need  fetal echo - Has not started checking sugars. Reviewed goals and rx for new meter and dexcom sent. BG log provided. - DISCONTINUE metformin  500 BID. START lantus  6u BID. Will f/u sugars within 1 week via MyChart. Discussed how to manage lows and when to call. - Baseline preE labs drawn - Discussed the risks of DM in pregnancy especially with poor control including but not limited to increased risk of fetal anomalies, preeclampsia, macrosomia, need for operative delivery I.e. vacuum, forcep, c-section, shoulder dystocia and resulting potential nerve injury.  - Reviewed serial growth ultrasounds and antenatal testing to start by 32 weeks - Delivery around 37-39 weeks  -     Blood Glucose Monitoring Suppl DEVI; 1 each by Does not apply route in the morning, at noon, in the evening, and at bedtime. May dispense any manufacturer covered by patient's insurance. -     Glucose Blood (BLOOD GLUCOSE TEST STRIPS) STRP; 1 each by Does not apply route in the morning, at noon, in the evening, and at bedtime. Use as directed to check blood sugar. May dispense any manufacturer covered by patient's insurance and fits patient's device. -     Lancets MISC; 1 each by Does not apply route in the morning, at noon, in the evening, and at bedtime. Use as directed to check blood sugar. May dispense any manufacturer covered by patient's insurance and fits patient's device. -     Comp Met (CMET) -     Protein / creatinine ratio, urine -     Continuous Glucose Transmitter (DEXCOM G6 TRANSMITTER) MISC; 1 Units by Does not apply route  daily. -     Continuous Glucose Receiver (DEXCOM G6 RECEIVER) DEVI; 1 Units by Does not apply route daily at 6 (six) AM. -     Continuous Glucose Sensor (DEXCOM G6 SENSOR) MISC; 1 Units by Does not apply route daily at 6 (six) AM. -     insulin  glargine (LANTUS ) 100 UNIT/ML Solostar Pen; Inject 6 Units into the skin 2 (two) times daily. -     Insulin  Pen Needle (PEN NEEDLES) 33G X 4 MM MISC; Please use as directed  4. History of shoulder dystocia in prior pregnancy G4 pregnancy - 2 minutes requiring McRoberts, suprapubic, & shoulder shrug. Infant weight 7#4 (3290g) Reasonable for trial of labor pending projected EFW/DM control  5. Fetal cardiac disease affecting pregnancy, single or unspecified fetus G3 pregnancy c/b RV dysfunction & dilation G5 pregnancy c/b pericardial effusion, otherwise normal fetal echo No residual cardiac issues for either child Fetal echo this pregnancy  6. Anemia of pregnancy in first trimester NOB Hgb 8.3 S/p IV iron  x4, last dose today Repeat CBC at 14-18w  7. Rh negative state in antepartum period Rhogam at 28w  8. History of preterm delivery G5 2018: PPROM at 30w then delivery at [redacted]w[redacted]d infant weight 4#11. Short interval pregnancy. CL with anatomy US   9. Vulvar dermatitis -     Cervicovaginal ancillary only( [place]) -     triamcinolone  ointment (KENALOG ) 0.5 %; Apply a thin layer of ointment to clean dry skin nightly for 14 days  Return in about 4 weeks (around 01/07/2024) for return OB at 14 weeks.  Kieth Carolin, MD Obstetrician & Gynecologist, [facility] for [employer], [facility]

## 2023-12-11 LAB — COMPREHENSIVE METABOLIC PANEL
ALT: 8 [IU]/L (ref 0–32)
AST: 7 [IU]/L (ref 0–40)
Albumin: 4.3 g/dL (ref 3.9–4.9)
Alkaline Phosphatase: 64 [IU]/L (ref 44–121)
BUN/Creatinine Ratio: 10 (ref 9–23)
BUN: 5 mg/dL — ABNORMAL LOW (ref 6–20)
Bilirubin Total: <0.2 mg/dL (ref 0.0–1.2)
CO2: 20 mmol/L (ref 20–29)
Calcium: 9.3 mg/dL (ref 8.7–10.2)
Chloride: 98 mmol/L (ref 96–106)
Creatinine, Ser: 0.49 mg/dL — ABNORMAL LOW (ref 0.57–1.00)
Globulin, Total: 3 g/dL (ref 1.5–4.5)
Glucose: 156 mg/dL — ABNORMAL HIGH (ref 70–99)
Potassium: 3.7 mmol/L (ref 3.5–5.2)
Sodium: 132 mmol/L — ABNORMAL LOW (ref 134–144)
Total Protein: 7.3 g/dL (ref 6.0–8.5)
eGFR: 128 mL/min/{1.73_m2} (ref >59)

## 2023-12-11 LAB — PROTEIN / CREATININE RATIO, URINE
Creatinine, Urine: 141.2 mg/dL
Protein, Ur: 36 mg/dL
Protein/Creat Ratio: 255 mg/g{creat} — ABNORMAL HIGH (ref 0–200)

## 2023-12-13 ENCOUNTER — Encounter: Payer: Self-pay | Admitting: Family Medicine

## 2023-12-13 LAB — CERVICOVAGINAL ANCILLARY ONLY
Bacterial Vaginitis (gardnerella): POSITIVE — AB
Candida Glabrata: NEGATIVE
Candida Vaginitis: POSITIVE — AB
Chlamydia: NEGATIVE
Comment: NEGATIVE
Comment: NEGATIVE
Comment: NEGATIVE
Comment: NEGATIVE
Comment: NORMAL
Neisseria Gonorrhea: NEGATIVE

## 2023-12-13 MED ORDER — TERCONAZOLE 0.4 % VA CREA: 1.0000 | TOPICAL_CREAM | Freq: Every day | VAGINAL | 0 refills | Status: AC

## 2023-12-13 MED ORDER — METRONIDAZOLE 500 MG PO TABS: 500.0000 mg | ORAL_TABLET | Freq: Two times a day (BID) | ORAL | 0 refills | Status: DC

## 2023-12-13 NOTE — Addendum Note (Signed)
 Addended by: Harvie Bridge on: 12/13/2023 06:26 PM   Modules accepted: Orders

## 2023-12-14 ENCOUNTER — Ambulatory Visit: Payer: Medicaid Other

## 2023-12-14 VITALS — BP 116/72 | HR 89 | Temp 98.6°F | Resp 18 | Ht 61.0 in | Wt 143.8 lb

## 2023-12-14 DIAGNOSIS — Z3A11 11 weeks gestation of pregnancy: Secondary | ICD-10-CM

## 2023-12-14 DIAGNOSIS — O99011 Anemia complicating pregnancy, first trimester: Secondary | ICD-10-CM | POA: Diagnosis not present

## 2023-12-14 DIAGNOSIS — D508 Other iron deficiency anemias: Secondary | ICD-10-CM | POA: Diagnosis not present

## 2023-12-14 MED ORDER — DIPHENHYDRAMINE HCL 25 MG PO CAPS
25.0000 mg | ORAL_CAPSULE | Freq: Once | ORAL | Status: AC
  Administered 2023-12-14: 25 mg via ORAL
  Filled 2023-12-14: qty 1

## 2023-12-14 MED ORDER — IRON SUCROSE 20 MG/ML IV SOLN
200.0000 mg | Freq: Once | INTRAVENOUS | Status: AC
Start: 2023-12-14 — End: 2023-12-14
  Administered 2023-12-14: 200 mg via INTRAVENOUS
  Filled 2023-12-14: qty 10

## 2023-12-14 MED ORDER — ACETAMINOPHEN 325 MG PO TABS
650.0000 mg | ORAL_TABLET | Freq: Once | ORAL | Status: AC
  Administered 2023-12-14: 650 mg via ORAL
  Filled 2023-12-14: qty 2

## 2023-12-14 NOTE — Progress Notes (Signed)
 Diagnosis: Iron  Deficiency Anemia  Provider:  Praveen Mannam MD  Procedure: IV Push  IV Type: Peripheral, IV Location: R Antecubital  Venofer  (Iron  Sucrose), Dose: 200 mg  Post Infusion IV Care: Patient declined observation and Peripheral IV Discontinued  Discharge: Condition: Good, Destination: Home . AVS Declined  Performed by:  Maximiano JONELLE Pouch, LPN

## 2023-12-14 NOTE — Progress Notes (Signed)
u

## 2023-12-15 LAB — CYTOLOGY - PAP
Comment: NEGATIVE
Diagnosis: NEGATIVE
Diagnosis: REACTIVE
High risk HPV: NEGATIVE

## 2023-12-16 ENCOUNTER — Encounter: Payer: Self-pay | Admitting: Obstetrics and Gynecology

## 2023-12-16 ENCOUNTER — Telehealth: Payer: Self-pay | Admitting: *Deleted

## 2023-12-16 DIAGNOSIS — O285 Abnormal chromosomal and genetic finding on antenatal screening of mother: Secondary | ICD-10-CM | POA: Insufficient documentation

## 2023-12-16 LAB — PANORAMA PRENATAL TEST FULL PANEL:PANORAMA TEST PLUS 5 ADDITIONAL MICRODELETIONS
FETAL FRACTION: 2.4
REPORT SUMMARY: HIGH — AB
TRIPLOIDY 13 18 RESULT TEXT: HIGH — AB

## 2023-12-16 NOTE — Telephone Encounter (Signed)
 TC to [phone], [employer] (for [facility]) to complete prior authorization for Dexcom system for patient. All components of the system are approved. Case number [id number]. The approval period is 12/16/23-06/13/24. At the end of the approved period clinic staff will need to call for renewal. MyChart message sent to patient to notify of approval.

## 2023-12-16 NOTE — Addendum Note (Signed)
 Addended by: Harvie Bridge on: 12/16/2023 01:48 PM   Modules accepted: Orders

## 2023-12-17 ENCOUNTER — Encounter: Payer: Self-pay | Admitting: Obstetrics and Gynecology

## 2023-12-17 ENCOUNTER — Other Ambulatory Visit: Payer: Self-pay

## 2023-12-17 ENCOUNTER — Inpatient Hospital Stay (HOSPITAL_COMMUNITY): Payer: Medicaid Other

## 2023-12-17 ENCOUNTER — Telehealth: Payer: Self-pay | Admitting: *Deleted

## 2023-12-17 ENCOUNTER — Inpatient Hospital Stay (HOSPITAL_COMMUNITY)
Admission: AD | Admit: 2023-12-17 | Discharge: 2023-12-17 | Disposition: A | Discharge status: Home / Self Care | Payer: Medicaid Other | Attending: Obstetrics and Gynecology | Admitting: Obstetrics and Gynecology

## 2023-12-17 DIAGNOSIS — Z3A11 11 weeks gestation of pregnancy: Secondary | ICD-10-CM | POA: Diagnosis not present

## 2023-12-17 DIAGNOSIS — O021 Missed abortion: Secondary | ICD-10-CM

## 2023-12-17 LAB — WET PREP, GENITAL
Clue Cells Wet Prep HPF POC: NONE SEEN
Sperm: NONE SEEN
Trich, Wet Prep: NONE SEEN
WBC, Wet Prep HPF POC: >=10 — AB (ref <10)
Yeast Wet Prep HPF POC: NONE SEEN

## 2023-12-17 LAB — URINALYSIS, ROUTINE W REFLEX MICROSCOPIC
Bilirubin Urine: NEGATIVE
Glucose, UA: NEGATIVE mg/dL
Ketones, ur: NEGATIVE mg/dL
Nitrite: NEGATIVE
Protein, ur: NEGATIVE mg/dL
RBC / HPF: >50 RBC/hpf (ref 0–5)
Specific Gravity, Urine: 1.01 (ref 1.005–1.030)
pH: 8 (ref 5.0–8.0)

## 2023-12-17 LAB — HORIZON 2 (CF & SMA)
CYSTIC FIBROSIS: NEGATIVE
REPORT SUMMARY: NEGATIVE
SPINAL MUSCULAR ATROPHY: NEGATIVE

## 2023-12-17 MED ORDER — OXYCODONE-ACETAMINOPHEN 5-325 MG PO TABS: 1.0000 | ORAL_TABLET | ORAL | 0 refills | Status: AC

## 2023-12-17 MED ORDER — IBUPROFEN 800 MG PO TABS: 800.0000 mg | ORAL_TABLET | Freq: Three times a day (TID) | ORAL | 0 refills | Status: AC

## 2023-12-17 NOTE — MAU Note (Addendum)
 Cindy Bruce is a 33 y.o. at [redacted]w[redacted]d here in MAU reporting: she's having cramping and VB that began last night.  Reports VB was light last night and is moderate today, not passing any blood clots.  Denies recent intercourse.  RN assessed VB, very scant amount noted on panty liner.  LMP: NA Onset of complaint: last night Pain score: 6 Vitals:   12/17/23 1311  BP: 104/62  Pulse: 99  Resp: 18  Temp: 98.2 F (36.8 C)  SpO2: 100%     QYU:Juuzfeuzi, none heard Lab orders placed from triage: UA

## 2023-12-17 NOTE — MAU Provider Note (Signed)
 History     CSN: [id number]  Arrival date and time: 12/17/23 1250   None     Chief Complaint  Patient presents with   Abdominal Pain   Vaginal Bleeding   HPI  Cindy Bruce is a 33 y.o. [id number] at [redacted]w[redacted]d who presents for evaluation of vaginal bleeding. Patient reports she started spotting last night but it has gotten gradually worse. She denies any pain. She had a normal ultrasound at [redacted] weeks gestation.  She denies any discharge. Denies any constipation, diarrhea or any urinary complaints.   OB History     Gravida  6   Para  5   Term  4   Preterm  1   AB  0   Living  5      SAB  0   IAB  0   Ectopic  0   Multiple  0   Live Births  5           Past Medical History:  Diagnosis Date   Anemia    Asthma    Diabetes mellitus    Type 2 since 1st pg in 2008 - on insulin     Past Surgical History:  Procedure Laterality Date   TOOTH EXTRACTION      Family History  Problem Relation Age of Onset   Hypertension Mother    Diabetes Mother    Diabetes Maternal Grandmother     Social History   Tobacco Use   Smoking status: Former    Current packs/day: 1.00    Types: Cigarettes   Smokeless tobacco: Former  Building Services Engineer status: Never Used  Substance Use Topics   Alcohol use: Not Currently    Comment: occ   Drug use: No    Allergies:  Allergies  Allergen Reactions   Food Anaphylaxis and Other (See Comments)    Pt is allergic to cantaloupe.   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 May dispense any manufacturer covered by patient's insurance. 1 each 0    Blood Pressure Monitoring (BLOOD PRESSURE KIT) DEVI 1 Device by Does not apply route once a week. 1 each 0    cephALEXin  (KEFLEX ) 500 MG capsule Take 1 capsule (500 mg total)  by mouth 4 (four) times daily. 28 capsule 2    Continuous Glucose Receiver (DEXCOM G6 RECEIVER) DEVI 1 Units by Does not apply route daily at 6 (six) AM. 1 each 11    Continuous Glucose Sensor (DEXCOM G6 SENSOR) MISC 1 Units by Does not apply route daily at 6 (six) AM. 1 each 11    Continuous Glucose Transmitter (DEXCOM G6 TRANSMITTER) MISC 1 Units by Does not apply route daily. 1 each 11    ferrous sulfate  325 (65 FE) MG EC tablet Take 1 tablet (325 mg total) by mouth 2 (two) times daily. (Patient not taking: Reported on 11/22/2023) 60 tablet 3    Glucose Blood (BLOOD GLUCOSE TEST STRIPS) STRP 1 each by Does not apply route in the morning, at noon, in the evening, and at bedtime. Use as directed to check blood sugar. May dispense any manufacturer covered by patient's insurance and fits patient's device. 100 strip 11    insulin  glargine (LANTUS ) 100 UNIT/ML Solostar Pen Inject 6 Units into the skin  2 (two) times daily. 15 mL 11    Insulin  Pen Needle (PEN NEEDLES) 33G X 4 MM MISC Please use as directed 100 each 11    Lancets MISC 1 each by Does not apply route in the morning, at noon, in the evening, and at bedtime. Use as directed to check blood sugar. May dispense any manufacturer covered by patient's insurance and fits patient's device. 100 each 11    metFORMIN  (GLUCOPHAGE ) 500 MG tablet Take 500 mg by mouth 2 (two) times daily with a meal.      metroNIDAZOLE  (FLAGYL ) 500 MG tablet Take 1 tablet (500 mg total) by mouth 2 (two) times daily for 7 days. 14 tablet 0    Prenatal Vit-Fe Fumarate-FA (PREPLUS) 27-1 MG TABS Take 1 tablet by mouth daily. 30 tablet 13    terconazole  (TERAZOL 7 ) 0.4 % vaginal cream Place 1 applicator vaginally at bedtime. 45 g 0    triamcinolone  ointment (KENALOG ) 0.5 % Apply a thin layer of ointment to clean dry skin nightly for 14 days 14 g 0     Review of Systems  Constitutional: Negative.  Negative for fatigue and fever.  HENT: Negative.    Respiratory: Negative.   Negative for shortness of breath.   Cardiovascular: Negative.  Negative for chest pain.  Gastrointestinal: Negative.  Negative for abdominal pain, constipation, diarrhea, nausea and vomiting.  Genitourinary:  Positive for vaginal bleeding. Negative for dysuria.  Neurological: Negative.  Negative for dizziness and headaches.   Physical Exam   Blood pressure 104/62, pulse 99, temperature 98.2 F (36.8 C), temperature source Oral, resp. rate 18, height 5' 1 (1.549 m), weight 65.2 kg, last menstrual period 09/11/2023, SpO2 100%, unknown if currently breastfeeding.  Patient Vitals for the past 24 hrs:  BP Temp Temp src Pulse Resp SpO2 Height Weight  12/17/23 1311 104/62 98.2 F (36.8 C) Oral 99 18 100 % -- --  12/17/23 1307 -- -- -- -- -- -- 5' 1 (1.549 m) 65.2 kg    Physical Exam Vitals and nursing note reviewed.  Constitutional:      General: She is not in acute distress.    Appearance: She is well-developed.  HENT:     Head: Normocephalic.  Eyes:     Pupils: Pupils are equal, round, and reactive to light.  Cardiovascular:     Rate and Rhythm: Normal rate and regular rhythm.     Heart sounds: Normal heart sounds.  Pulmonary:     Effort: Pulmonary effort is normal. No respiratory distress.     Breath sounds: Normal breath sounds.  Abdominal:     General: Bowel sounds are normal. There is no distension.     Palpations: Abdomen is soft.     Tenderness: There is no abdominal tenderness.  Skin:    General: Skin is warm and dry.  Neurological:     Mental Status: She is alert and oriented to person, place, and time.  Psychiatric:        Mood and Affect: Mood normal.        Behavior: Behavior normal.        Thought Content: Thought content normal.        Judgment: Judgment normal.      MAU Course  Procedures  Results for orders placed or performed during the hospital encounter of 12/17/23 (from the past 24 hours)  Wet prep, genital     Status: Abnormal   Collection Time:  12/17/23  1:23 PM   Specimen:  Vaginal  Result Value Ref Range   Yeast Wet Prep HPF POC NONE SEEN NONE SEEN   Trich, Wet Prep NONE SEEN NONE SEEN   Clue Cells Wet Prep HPF POC NONE SEEN NONE SEEN   WBC, Wet Prep HPF POC >=10 (A) <10   Sperm NONE SEEN   Urinalysis, Routine w reflex microscopic -Urine, Clean Catch     Status: Abnormal   Collection Time: 12/17/23  2:12 PM  Result Value Ref Range   Color, Urine YELLOW YELLOW   APPearance CLEAR CLEAR   Specific Gravity, Urine 1.010 1.005 - 1.030   pH 8.0 5.0 - 8.0   Glucose, UA NEGATIVE NEGATIVE mg/dL   Hgb urine dipstick LARGE (A) NEGATIVE   Bilirubin Urine NEGATIVE NEGATIVE   Ketones, ur NEGATIVE NEGATIVE mg/dL   Protein, ur NEGATIVE NEGATIVE mg/dL   Nitrite NEGATIVE NEGATIVE   Leukocytes,Ua TRACE (A) NEGATIVE   RBC / HPF >50 0 - 5 RBC/hpf   WBC, UA 0-5 0 - 5 WBC/hpf   Bacteria, UA RARE (A) NONE SEEN   Squamous Epithelial / HPF 6-10 0 - 5 /HPF     US  OB Transvaginal Result Date: 12/17/2023 CLINICAL DATA:  Vaginal spotting. Estimated gestational age of [redacted] weeks, 3 days by first ultrasound. EXAM: TRANSVAGINAL OB ULTRASOUND TECHNIQUE: Transvaginal ultrasound was performed for complete evaluation of the gestation as well as the maternal uterus, adnexal regions, and pelvic cul-de-sac. COMPARISON:  OB ultrasound dated November 22, 2023. FINDINGS: Intrauterine gestational sac: Single. Yolk sac:  Visualized. Embryo:  Visualized. Cardiac Activity: Not Visualized. Heart Rate:  bpm CRL:   27.2 mm   9 w 4 d Subchorionic hemorrhage:  None visualized. Maternal uterus/adnexae: Normal left ovary. The right ovary is not visualized. Trace free fluid in the pelvis. IMPRESSION: 1. Findings meet definitive criteria for failed pregnancy. This follows SRU consensus guidelines: Diagnostic Criteria for Nonviable Pregnancy Early in the First Trimester. LOISE [place] J Med [phone]. Electronically Signed   By: Elsie ONEIDA Shoulder M.D.   On: 12/17/2023 14:44      MDM Labs ordered and reviewed.   UA Wet prep/GC Unable to doppler FHT US  OB Transvaginal  CNM independently reviewed the imaging ordered. Imaging show IUP at 9 weeks without FHR  CNM informed patient of results of ultrasound showing missed AB. Condolences provided and space given for patient to process emotions. CNM discussed options for management including expectant management, cytotec  and surgical management. Risks and benefits of each reviewed at length. Patient desires expectant management and verbalized understanding of risks and benefits of this method.   Encouraged patient to take oral iron  supplement at home due to hx of anemia and message sent to clinic to see patient this week for follow up.  Assessment and Plan   1. Missed abortion   2. [redacted] weeks gestation of pregnancy     -Discharge home in stable condition -Rx for ibuprofen  and percocet -Vaginal bleeding and pain precautions discussed -Patient advised to follow-up with OB next week.  -Patient may return to MAU as needed or if her condition were to change or worsen  Aleck CHRISTELLA Fireman, CNM 12/17/2023, 2:11 PM

## 2023-12-17 NOTE — Telephone Encounter (Signed)
 TC to discuss Dexcom system approval by insurance carrier. Pt advised system is approved and available at her pharmacy. Per Dr. Willistine request, confirmed that pt is aware of Panorama results and need for genetic counseling and recommended redraw in 2-4 weeks. Pt confirmed she is aware and knows to answer calls so that her appt can be scheduled promptly.

## 2023-12-20 LAB — GC/CHLAMYDIA PROBE AMP (~~LOC~~) NOT AT ARMC
Chlamydia: NEGATIVE
Comment: NEGATIVE
Comment: NORMAL
Neisseria Gonorrhea: NEGATIVE

## 2023-12-21 ENCOUNTER — Ambulatory Visit: Payer: Medicaid Other | Admitting: Family Medicine

## 2023-12-24 ENCOUNTER — Other Ambulatory Visit: Payer: Medicaid Other

## 2024-01-10 ENCOUNTER — Encounter: Payer: Medicaid Other | Admitting: Obstetrics & Gynecology

## 2024-02-03 DIAGNOSIS — Z8759 Personal history of other complications of pregnancy, childbirth and the puerperium: Secondary | ICD-10-CM | POA: Insufficient documentation

## 2024-02-08 ENCOUNTER — Ambulatory Visit

## 2024-02-08 ENCOUNTER — Ambulatory Visit: Payer: Medicaid Other

## 2024-02-08 ENCOUNTER — Other Ambulatory Visit

## 2024-02-08 DIAGNOSIS — Z8759 Personal history of other complications of pregnancy, childbirth and the puerperium: Secondary | ICD-10-CM

## 2024-02-09 ENCOUNTER — Ambulatory Visit: Admitting: Obstetrics and Gynecology

## 2024-02-16 ENCOUNTER — Encounter: Payer: Self-pay | Admitting: Obstetrics and Gynecology

## 2024-02-16 ENCOUNTER — Ambulatory Visit: Admitting: Obstetrics and Gynecology

## 2024-04-01 ENCOUNTER — Other Ambulatory Visit: Payer: Self-pay | Admitting: Obstetrics and Gynecology

## 2024-04-03 ENCOUNTER — Other Ambulatory Visit: Payer: Self-pay

## 2024-04-03 MED ORDER — FLUCONAZOLE 150 MG PO TABS: 150.0000 mg | ORAL_TABLET | Freq: Once | ORAL | 0 refills | Status: AC

## 2024-04-13 ENCOUNTER — Ambulatory Visit (INDEPENDENT_AMBULATORY_CARE_PROVIDER_SITE_OTHER)

## 2024-04-13 ENCOUNTER — Other Ambulatory Visit (HOSPITAL_COMMUNITY)
Admission: RE | Admit: 2024-04-13 | Discharge: 2024-04-13 | Disposition: A | Discharge status: Home / Self Care | Source: Ambulatory Visit | Attending: Obstetrics and Gynecology | Admitting: Obstetrics and Gynecology

## 2024-04-13 VITALS — BP 107/68 | HR 105 | Wt 145.0 lb

## 2024-04-13 DIAGNOSIS — Z113 Encounter for screening for infections with a predominantly sexual mode of transmission: Secondary | ICD-10-CM | POA: Diagnosis present

## 2024-04-13 NOTE — Progress Notes (Signed)
..  SUBJECTIVE:  33 y.o. female is in thje office requesting STD screening. Denies abnormal vaginal bleeding or significant pelvic pain or fever. No UTI symptoms. Denies history of known exposure to STD.  No LMP recorded.  OBJECTIVE:  She appears well, afebrile. Urine dipstick: not done.  ASSESSMENT:  STD Screening   PLAN:  HIV, RPR, Hep B, Hep C blood work & GC, chlamydia, trichomonas, BVAG, CVAG probe sent to lab. Treatment: To be determined once lab results are received ROV prn if symptoms persist or worsen.

## 2024-04-14 LAB — CERVICOVAGINAL ANCILLARY ONLY
Bacterial Vaginitis (gardnerella): POSITIVE — AB
Candida Glabrata: NEGATIVE
Candida Vaginitis: NEGATIVE
Chlamydia: NEGATIVE
Comment: NEGATIVE
Comment: NEGATIVE
Comment: NEGATIVE
Comment: NEGATIVE
Comment: NEGATIVE
Comment: NORMAL
Neisseria Gonorrhea: NEGATIVE
Trichomonas: NEGATIVE

## 2024-04-15 LAB — HEPATITIS C ANTIBODY: Hep C Virus Ab: NONREACTIVE

## 2024-04-15 LAB — HEPATITIS B SURFACE ANTIGEN: Hepatitis B Surface Ag: NEGATIVE

## 2024-04-15 LAB — RPR: RPR Ser Ql: NONREACTIVE

## 2024-04-15 LAB — HIV ANTIBODY (ROUTINE TESTING W REFLEX): HIV Screen 4th Generation wRfx: NONREACTIVE

## 2024-04-17 ENCOUNTER — Other Ambulatory Visit: Payer: Self-pay | Admitting: *Deleted

## 2024-04-17 DIAGNOSIS — B9689 Other specified bacterial agents as the cause of diseases classified elsewhere: Secondary | ICD-10-CM

## 2024-04-17 MED ORDER — METRONIDAZOLE 500 MG PO TABS: 500.0000 mg | ORAL_TABLET | Freq: Two times a day (BID) | ORAL | 0 refills | Status: AC

## 2024-04-17 NOTE — Progress Notes (Signed)
 Aptima swab positive for BV. Pt requests RX. RX sent per protocol.

## 2024-05-03 ENCOUNTER — Ambulatory Visit: Admitting: Obstetrics and Gynecology

## 2024-06-14 ENCOUNTER — Ambulatory Visit: Admitting: Obstetrics and Gynecology

## 2024-06-22 ENCOUNTER — Encounter: Payer: Self-pay | Admitting: Obstetrics and Gynecology

## 2024-06-22 ENCOUNTER — Ambulatory Visit: Admitting: Obstetrics and Gynecology

## 2024-06-22 VITALS — BP 107/75 | HR 107 | Wt 152.0 lb

## 2024-06-22 DIAGNOSIS — Z3202 Encounter for pregnancy test, result negative: Secondary | ICD-10-CM

## 2024-06-22 DIAGNOSIS — Z30430 Encounter for insertion of intrauterine contraceptive device: Secondary | ICD-10-CM

## 2024-06-22 LAB — POCT URINE PREGNANCY: Preg Test, Ur: NEGATIVE

## 2024-06-22 MED ORDER — LEVONORGESTREL 20 MCG/DAY IU IUD
1.0000 | INTRAUTERINE_SYSTEM | Freq: Once | INTRAUTERINE | Status: AC
Start: 2024-06-22 — End: ?

## 2024-06-22 NOTE — Progress Notes (Signed)
 Pt is in the office for IUD insertion  LMP 06/14/24

## 2024-06-22 NOTE — Patient Instructions (Signed)

## 2024-06-22 NOTE — Progress Notes (Signed)
   GYNECOLOGY CLINIC PROCEDURE NOTE  JAPLEEN TORNOW is a 33 y.o. H3E5884 here for Mirena  IUD insertion. No GYN concerns.  Last pap smear was on 12/2023 and was normal. Has had two previous Mirena  IUDs that she did well with.   IUD Insertion Procedure Note Patient identified, informed consent performed, consent signed.   Discussed risks of irregular bleeding, cramping, infection, malpositioning or misplacement of the IUD outside the uterus which may require further procedure such as laparoscopy. Time out was performed.  Urine pregnancy test negative.  Speculum placed in the vagina.  Cervix visualized.  Cleaned with Betadine x 2.  Grasped anteriorly with a single tooth tenaculum.  Uterus sounded to 7 cm.  Mirena  IUD placed per manufacturer's recommendations.  Strings trimmed to 3 cm. Tenaculum was removed, good hemostasis noted.  Patient tolerated procedure well.   Patient was given post-procedure instructions.  She was advised to have backup contraception for one week.  Patient was also asked to check IUD strings periodically and follow up in 4 weeks for IUD check.  Nidia Daring, FNP

## 2024-07-20 ENCOUNTER — Ambulatory Visit: Admitting: Obstetrics and Gynecology

## 2024-09-04 ENCOUNTER — Ambulatory Visit

## 2024-09-05 ENCOUNTER — Ambulatory Visit (INDEPENDENT_AMBULATORY_CARE_PROVIDER_SITE_OTHER)

## 2024-09-05 ENCOUNTER — Other Ambulatory Visit (HOSPITAL_COMMUNITY)
Admission: RE | Admit: 2024-09-05 | Discharge: 2024-09-05 | Disposition: A | Discharge status: Home / Self Care | Source: Ambulatory Visit | Attending: Obstetrics and Gynecology | Admitting: Obstetrics and Gynecology

## 2024-09-05 ENCOUNTER — Other Ambulatory Visit: Payer: Self-pay

## 2024-09-05 VITALS — BP 128/78 | HR 78

## 2024-09-05 DIAGNOSIS — N898 Other specified noninflammatory disorders of vagina: Secondary | ICD-10-CM | POA: Insufficient documentation

## 2024-09-05 MED ORDER — TRIAMCINOLONE ACETONIDE 0.5 % EX OINT: TOPICAL_OINTMENT | CUTANEOUS | 0 refills | Status: AC

## 2024-09-05 NOTE — Progress Notes (Signed)
 SUBJECTIVE:  32 y.o. female complains of white vaginal discharge and itching for 5 day(s). Has also experienced outside redness and irritation due to itching. Denies abnormal vaginal bleeding or significant pelvic pain or fever. No UTI symptoms. Denies history of known exposure to STD.  Patient's last menstrual period was 07/20/2024 (approximate).  OBJECTIVE:  She appears well, afebrile. Urine dipstick: not done.  ASSESSMENT:  Vaginal Discharge  Vaginal Itching   PLAN:  GC, chlamydia, trichomonas, BVAG, CVAG probe sent to lab. Treatment: To be determined once lab results are received ROV prn if symptoms persist or worsen.

## 2024-09-06 LAB — CERVICOVAGINAL ANCILLARY ONLY
Bacterial Vaginitis (gardnerella): POSITIVE — AB
Candida Glabrata: POSITIVE — AB
Candida Vaginitis: POSITIVE — AB
Chlamydia: NEGATIVE
Comment: NEGATIVE
Comment: NEGATIVE
Comment: NEGATIVE
Comment: NEGATIVE
Comment: NEGATIVE
Comment: NORMAL
Neisseria Gonorrhea: NEGATIVE
Trichomonas: NEGATIVE

## 2024-09-07 ENCOUNTER — Ambulatory Visit: Payer: Self-pay | Admitting: Obstetrics and Gynecology

## 2024-09-07 DIAGNOSIS — B3731 Acute candidiasis of vulva and vagina: Secondary | ICD-10-CM

## 2024-09-07 DIAGNOSIS — B9689 Other specified bacterial agents as the cause of diseases classified elsewhere: Secondary | ICD-10-CM

## 2024-09-07 MED ORDER — METRONIDAZOLE 0.75 % VA GEL: 1.0000 | Freq: Every day | VAGINAL | 1 refills | Status: AC

## 2024-09-07 MED ORDER — FLUCONAZOLE 150 MG PO TABS: 150.0000 mg | ORAL_TABLET | Freq: Once | ORAL | 0 refills | Status: AC

## 2024-09-26 ENCOUNTER — Ambulatory Visit: Admitting: Obstetrics and Gynecology
